# Patient Record
Sex: Female | Born: 1940
Health system: Southern US, Community
[De-identification: ages and names within clinical notes are randomized; demographics above are authoritative.]

## PROBLEM LIST (undated history)

## (undated) DIAGNOSIS — F419 Anxiety disorder, unspecified: Secondary | ICD-10-CM

## (undated) DIAGNOSIS — I10 Essential (primary) hypertension: Secondary | ICD-10-CM

## (undated) DIAGNOSIS — R519 Headache, unspecified: Secondary | ICD-10-CM

## (undated) DIAGNOSIS — R112 Nausea with vomiting, unspecified: Secondary | ICD-10-CM

## (undated) DIAGNOSIS — Z5189 Encounter for other specified aftercare: Secondary | ICD-10-CM

## (undated) DIAGNOSIS — H269 Unspecified cataract: Secondary | ICD-10-CM

## (undated) DIAGNOSIS — R51 Headache: Secondary | ICD-10-CM

## (undated) DIAGNOSIS — Z9889 Other specified postprocedural states: Secondary | ICD-10-CM

## (undated) DIAGNOSIS — M1990 Unspecified osteoarthritis, unspecified site: Secondary | ICD-10-CM

## (undated) DIAGNOSIS — K219 Gastro-esophageal reflux disease without esophagitis: Secondary | ICD-10-CM

## (undated) DIAGNOSIS — E039 Hypothyroidism, unspecified: Secondary | ICD-10-CM

## (undated) DIAGNOSIS — H3530 Unspecified macular degeneration: Secondary | ICD-10-CM

## (undated) DIAGNOSIS — D649 Anemia, unspecified: Secondary | ICD-10-CM

## (undated) HISTORY — PX: TUBAL LIGATION: SHX77

## (undated) HISTORY — DX: Encounter for other specified aftercare: Z51.89

## (undated) HISTORY — DX: Unspecified cataract: H26.9

## (undated) HISTORY — PX: EYE SURGERY: SHX253

## (undated) HISTORY — DX: Unspecified macular degeneration: H35.30

## (undated) HISTORY — PX: CHOLECYSTECTOMY: SHX55

## (undated) HISTORY — PX: APPENDECTOMY: SHX54

---

## 1978-02-23 HISTORY — PX: ABDOMINAL HYSTERECTOMY: SHX81

## 2004-05-16 ENCOUNTER — Ambulatory Visit: Payer: Self-pay | Admitting: Unknown Physician Specialty

## 2005-02-20 ENCOUNTER — Ambulatory Visit: Payer: Self-pay | Admitting: Podiatry

## 2005-06-23 ENCOUNTER — Ambulatory Visit: Payer: Self-pay | Admitting: Internal Medicine

## 2005-06-26 ENCOUNTER — Ambulatory Visit: Payer: Self-pay | Admitting: Internal Medicine

## 2006-01-22 ENCOUNTER — Ambulatory Visit: Payer: Self-pay | Admitting: Internal Medicine

## 2006-02-06 ENCOUNTER — Emergency Department: Payer: Self-pay

## 2006-02-19 ENCOUNTER — Emergency Department: Payer: Self-pay | Admitting: Internal Medicine

## 2006-08-03 ENCOUNTER — Ambulatory Visit: Payer: Self-pay | Admitting: Family Medicine

## 2007-04-30 ENCOUNTER — Ambulatory Visit: Payer: Self-pay | Admitting: Unknown Physician Specialty

## 2008-10-26 DIAGNOSIS — K648 Other hemorrhoids: Secondary | ICD-10-CM | POA: Insufficient documentation

## 2008-10-26 DIAGNOSIS — K219 Gastro-esophageal reflux disease without esophagitis: Secondary | ICD-10-CM | POA: Insufficient documentation

## 2008-10-26 DIAGNOSIS — F411 Generalized anxiety disorder: Secondary | ICD-10-CM | POA: Insufficient documentation

## 2009-06-09 ENCOUNTER — Emergency Department: Payer: Self-pay | Admitting: Emergency Medicine

## 2009-06-28 DIAGNOSIS — M545 Low back pain, unspecified: Secondary | ICD-10-CM | POA: Insufficient documentation

## 2010-01-03 ENCOUNTER — Ambulatory Visit: Payer: Self-pay | Admitting: Unknown Physician Specialty

## 2010-01-07 LAB — PATHOLOGY REPORT

## 2010-06-13 ENCOUNTER — Ambulatory Visit: Payer: Self-pay | Admitting: Family Medicine

## 2012-02-24 HISTORY — PX: CATARACT EXTRACTION: SUR2

## 2012-09-01 ENCOUNTER — Ambulatory Visit: Payer: Self-pay | Admitting: Family Medicine

## 2012-12-14 ENCOUNTER — Ambulatory Visit: Payer: Self-pay | Admitting: Family Medicine

## 2013-05-15 LAB — LIPID PANEL
Cholesterol: 209 mg/dL — AB (ref 0–200)
HDL: 85 mg/dL — AB (ref 35–70)
LDL CALC: 98 mg/dL
Triglycerides: 129 mg/dL (ref 40–160)

## 2013-06-01 ENCOUNTER — Ambulatory Visit: Payer: Self-pay | Admitting: Family Medicine

## 2014-07-31 ENCOUNTER — Other Ambulatory Visit: Payer: Self-pay | Admitting: Family Medicine

## 2014-07-31 DIAGNOSIS — G43909 Migraine, unspecified, not intractable, without status migrainosus: Secondary | ICD-10-CM | POA: Insufficient documentation

## 2014-07-31 DIAGNOSIS — E039 Hypothyroidism, unspecified: Secondary | ICD-10-CM

## 2014-07-31 DIAGNOSIS — G43009 Migraine without aura, not intractable, without status migrainosus: Secondary | ICD-10-CM

## 2014-09-27 DIAGNOSIS — M5417 Radiculopathy, lumbosacral region: Secondary | ICD-10-CM | POA: Insufficient documentation

## 2014-10-01 ENCOUNTER — Other Ambulatory Visit: Payer: Self-pay | Admitting: Unknown Physician Specialty

## 2014-10-01 DIAGNOSIS — M5417 Radiculopathy, lumbosacral region: Secondary | ICD-10-CM

## 2014-10-05 ENCOUNTER — Ambulatory Visit
Admission: RE | Admit: 2014-10-05 | Discharge: 2014-10-05 | Disposition: A | Discharge status: Home / Self Care | Payer: Medicare PPO | Source: Ambulatory Visit | Attending: Unknown Physician Specialty | Admitting: Unknown Physician Specialty

## 2014-10-05 DIAGNOSIS — M5417 Radiculopathy, lumbosacral region: Secondary | ICD-10-CM | POA: Diagnosis present

## 2014-10-05 DIAGNOSIS — M5136 Other intervertebral disc degeneration, lumbar region: Secondary | ICD-10-CM | POA: Diagnosis not present

## 2014-10-05 DIAGNOSIS — M4807 Spinal stenosis, lumbosacral region: Secondary | ICD-10-CM | POA: Diagnosis not present

## 2014-10-24 ENCOUNTER — Other Ambulatory Visit: Payer: Self-pay | Admitting: Unknown Physician Specialty

## 2014-10-24 DIAGNOSIS — R935 Abnormal findings on diagnostic imaging of other abdominal regions, including retroperitoneum: Secondary | ICD-10-CM

## 2014-10-31 ENCOUNTER — Ambulatory Visit
Admission: RE | Admit: 2014-10-31 | Discharge: 2014-10-31 | Disposition: A | Discharge status: Home / Self Care | Payer: Medicare PPO | Source: Ambulatory Visit | Attending: Unknown Physician Specialty | Admitting: Unknown Physician Specialty

## 2014-10-31 DIAGNOSIS — R1900 Intra-abdominal and pelvic swelling, mass and lump, unspecified site: Secondary | ICD-10-CM | POA: Diagnosis not present

## 2014-10-31 DIAGNOSIS — R935 Abnormal findings on diagnostic imaging of other abdominal regions, including retroperitoneum: Secondary | ICD-10-CM | POA: Insufficient documentation

## 2014-11-19 ENCOUNTER — Telehealth: Payer: Self-pay | Admitting: Family Medicine

## 2014-11-19 NOTE — Telephone Encounter (Signed)
Ok to order. Thanks.   

## 2014-11-19 NOTE — Telephone Encounter (Signed)
pt called needing appt for her feet swelling.  Wants to know if she can come in tomorrow.  All appts available are marked same day.    Please advise a time.  Her call back is [phone].  Thanks Barth Kirks

## 2014-11-19 NOTE — Telephone Encounter (Signed)
Coming in tomorrow.  At 10:45.  Thanks [employer]

## 2014-11-20 ENCOUNTER — Encounter: Payer: Self-pay | Admitting: Family Medicine

## 2014-11-20 ENCOUNTER — Ambulatory Visit (INDEPENDENT_AMBULATORY_CARE_PROVIDER_SITE_OTHER): Payer: Medicare PPO | Admitting: Family Medicine

## 2014-11-20 ENCOUNTER — Ambulatory Visit
Admission: RE | Admit: 2014-11-20 | Discharge: 2014-11-20 | Disposition: A | Discharge status: Home / Self Care | Payer: Medicare PPO | Source: Ambulatory Visit | Attending: Family Medicine | Admitting: Family Medicine

## 2014-11-20 VITALS — BP 116/64 | HR 80 | Temp 98.4°F | Resp 16 | Ht 65.0 in | Wt 136.0 lb

## 2014-11-20 DIAGNOSIS — M10072 Idiopathic gout, left ankle and foot: Secondary | ICD-10-CM | POA: Diagnosis not present

## 2014-11-20 DIAGNOSIS — I10 Essential (primary) hypertension: Secondary | ICD-10-CM | POA: Insufficient documentation

## 2014-11-20 DIAGNOSIS — R011 Cardiac murmur, unspecified: Secondary | ICD-10-CM | POA: Insufficient documentation

## 2014-11-20 DIAGNOSIS — R609 Edema, unspecified: Secondary | ICD-10-CM

## 2014-11-20 DIAGNOSIS — M7989 Other specified soft tissue disorders: Secondary | ICD-10-CM | POA: Insufficient documentation

## 2014-11-20 DIAGNOSIS — R0602 Shortness of breath: Secondary | ICD-10-CM | POA: Insufficient documentation

## 2014-11-20 DIAGNOSIS — E039 Hypothyroidism, unspecified: Secondary | ICD-10-CM

## 2014-11-20 DIAGNOSIS — G939 Disorder of brain, unspecified: Secondary | ICD-10-CM | POA: Insufficient documentation

## 2014-11-20 DIAGNOSIS — M109 Gout, unspecified: Secondary | ICD-10-CM | POA: Insufficient documentation

## 2014-11-20 DIAGNOSIS — H6980 Other specified disorders of Eustachian tube, unspecified ear: Secondary | ICD-10-CM | POA: Insufficient documentation

## 2014-11-20 DIAGNOSIS — I6782 Cerebral ischemia: Secondary | ICD-10-CM | POA: Insufficient documentation

## 2014-11-20 DIAGNOSIS — H6990 Unspecified Eustachian tube disorder, unspecified ear: Secondary | ICD-10-CM | POA: Insufficient documentation

## 2014-11-20 DIAGNOSIS — E7800 Pure hypercholesterolemia, unspecified: Secondary | ICD-10-CM | POA: Insufficient documentation

## 2014-11-20 DIAGNOSIS — R195 Other fecal abnormalities: Secondary | ICD-10-CM | POA: Insufficient documentation

## 2014-11-20 MED ORDER — PREDNISONE 10 MG PO TABS: ORAL_TABLET | ORAL | Status: DC

## 2014-11-20 NOTE — Progress Notes (Signed)
Subjective:    Patient ID: Jamie [name], female    DOB: [birth year], 74 y.o.   MRN: [id number]  HPI  Edema: Patient complains of edema. The location of the edema is ankle(s) bilateral (left worse than right).  The edema has been moderate.  Onset of symptoms was 5 weeks ago, gradually worsening since that time. The edema is present all day. The patient states the problem is long-standing.  The swelling has been aggravated by increased salt intake, relieved by nothing, and been associated with nothing. Cardiac risk factors include advanced age (older than 75 for men, 59 for women) and hypertension.  Has been on HCTZ for this in the past. Went back to whole pill. Also, having foot pain in her left foot. Is severe at time. Hurts to walk or move a certain.     Review of Systems  Constitutional: Positive for fatigue and unexpected weight change (gain due to swelling). Negative for fever, chills, diaphoresis, activity change and appetite change.  Respiratory: Negative for cough, chest tightness, shortness of breath and wheezing.   Cardiovascular: Positive for leg swelling. Negative for chest pain and palpitations.   BP 116/64 mmHg  Pulse 80  Temp(Src) 98.4 F (36.9 C) (Oral)  Resp 16  Ht  (1.651 m)  Wt 136 lb (61.689 kg)  BMI 22.63 kg/m2  SpO2 100%   Patient Active Problem List   Diagnosis Date Noted  . Dysfunction of eustachian tube 11/20/2014  . Fecal occult blood test positive 11/20/2014  . Hypercholesteremia 11/20/2014  . BP (high blood pressure) 11/20/2014  . Cardiac murmur 11/20/2014  . Temporary cerebral vascular dysfunction 11/20/2014  . L-S radiculopathy 09/27/2014  . Migraine 07/31/2014  . Hypothyroidism 07/31/2014  . Colon polyp 12/24/2009  . LBP (low back pain) 06/28/2009  . Anxiety state 10/26/2008  . Accumulation of fluid in tissues 10/26/2008  . Acid reflux 10/26/2008  . Esophageal stenosis 10/26/2008  . Hemorrhoids, internal 10/26/2008  . Menopausal and  perimenopausal disorder 10/26/2008  . Allergic rhinitis 07/13/2006   No past medical history on file. Current Outpatient Prescriptions on File Prior to Visit  Medication Sig  . nortriptyline (PAMELOR) 50 MG capsule TAKE 1 CAPSULE DAILY  . SYNTHROID 125 MCG tablet TAKE 1 TABLET BY MOUTH EVERY DAY   No current facility-administered medications on file prior to visit.   Allergies  Allergen Reactions  . Codeine   . Penicillins Rash   Past Surgical History  Procedure Laterality Date  . Cataract extraction Bilateral 2014  . Cholecystectomy    . Abdominal hysterectomy  1980  . Tubal ligation     Social History   Social History  . Marital Status: Married    Spouse Name: N/A  . Number of Children: N/A  . Years of Education: N/A   Occupational History  . Not on file.   Social History Main Topics  . Smoking status: Never Smoker   . Smokeless tobacco: Never Used  . Alcohol Use: No  . Drug Use: No  . Sexual Activity: Not on file   Other Topics Concern  . Not on file   Social History Narrative   Family History  Problem Relation Age of Onset  . Hyperlipidemia Mother   . Heart attack Father   . Emphysema Father   . Healthy Sister   . Diabetes Brother   . Lymphoma Brother   . Stroke Maternal Grandmother        Objective:   Physical Exam  Constitutional:  She is oriented to person, place, and time. She appears well-developed and well-nourished.  Cardiovascular: Normal rate and regular rhythm.   Pulmonary/Chest: Effort normal and breath sounds normal.  Musculoskeletal: She exhibits edema and tenderness.  In left foot.   Neurological: She is alert and oriented to person, place, and time.  Psychiatric: She has a normal mood and affect. Her behavior is normal. Judgment and thought content normal.   BP 116/64 mmHg  Pulse 80  Temp(Src) 98.4 F (36.9 C) (Oral)  Resp 16  Ht  (1.651 m)  Wt 136 lb (61.689 kg)  BMI 22.63 kg/m2  SpO2 100%      Assessment & Plan:   1. Accumulation of fluid in tissues Will check labs and study as below.  Further treatment pending these results.  - CBC with Differential/Platelet - D-Dimer, Quantitative - Comprehensive metabolic panel - US Venous Img Lower Unilateral Left; Future  2. Hypothyroidism, unspecified hypothyroidism type Stable.  Check labs.  - CBC with Differential/Platelet - TSH  3. Acute idiopathic gout of left foot May be etiology of pain. Will check labs, treat and further plan pending these results.  - Uric acid - predniSONE (DELTASONE) 10 MG tablet; 6 po for 1 day and then 5 po for 1 day and then 4 po for 1 day and 3 po for 1 day and then 2 po for 1 day and then 1 po for 1 day.  Dispense: 21 tablet; Refill: 0  4. Swelling of left lower extremity Will check for DVT.  - D-Dimer, Quantitative - US Venous Img Lower Unilateral Left; Future - US Venous Img Lower Unilateral Left - US Venous Img Lower Unilateral Left; Future  5. Shortness of breath Will check for DVT and CHF.   - B Nat Peptide - US Venous Img Lower Unilateral Left; Future  Lorie Phenix, MD

## 2014-11-21 ENCOUNTER — Telehealth: Payer: Self-pay

## 2014-11-21 LAB — CBC WITH DIFFERENTIAL/PLATELET
BASOS ABS: 0 10*3/uL (ref 0.0–0.2)
Basos: 1 %
EOS (ABSOLUTE): 0.4 10*3/uL (ref 0.0–0.4)
Eos: 6 %
Hematocrit: 38 % (ref 34.0–46.6)
Hemoglobin: 11.9 g/dL (ref 11.1–15.9)
Immature Grans (Abs): 0 10*3/uL (ref 0.0–0.1)
Immature Granulocytes: 0 %
LYMPHS ABS: 1.9 10*3/uL (ref 0.7–3.1)
Lymphs: 32 %
MCH: 24.6 pg — AB (ref 26.6–33.0)
MCHC: 31.3 g/dL — AB (ref 31.5–35.7)
MCV: 79 fL (ref 79–97)
Monocytes Absolute: 0.6 10*3/uL (ref 0.1–0.9)
Monocytes: 10 %
NEUTROS ABS: 3.1 10*3/uL (ref 1.4–7.0)
Neutrophils: 51 %
PLATELETS: 369 10*3/uL (ref 150–379)
RBC: 4.84 x10E6/uL (ref 3.77–5.28)
RDW: 15.2 % (ref 12.3–15.4)
WBC: 6.1 10*3/uL (ref 3.4–10.8)

## 2014-11-21 LAB — COMPREHENSIVE METABOLIC PANEL
A/G RATIO: 1.8 (ref 1.1–2.5)
ALT: 21 IU/L (ref 0–32)
AST: 28 IU/L (ref 0–40)
Albumin: 4.5 g/dL (ref 3.5–4.8)
Alkaline Phosphatase: 68 IU/L (ref 39–117)
BILIRUBIN TOTAL: 0.2 mg/dL (ref 0.0–1.2)
BUN/Creatinine Ratio: 18 (ref 11–26)
BUN: 14 mg/dL (ref 8–27)
CHLORIDE: 98 mmol/L (ref 97–108)
CO2: 28 mmol/L (ref 18–29)
Calcium: 10.1 mg/dL (ref 8.7–10.3)
Creatinine, Ser: 0.76 mg/dL (ref 0.57–1.00)
GFR calc non Af Amer: 79 mL/min/{1.73_m2} (ref >59)
GFR, EST AFRICAN AMERICAN: 91 mL/min/{1.73_m2} (ref >59)
GLOBULIN, TOTAL: 2.5 g/dL (ref 1.5–4.5)
Glucose: 95 mg/dL (ref 65–99)
POTASSIUM: 4.1 mmol/L (ref 3.5–5.2)
SODIUM: 143 mmol/L (ref 134–144)
TOTAL PROTEIN: 7 g/dL (ref 6.0–8.5)

## 2014-11-21 LAB — TSH: TSH: 0.227 u[IU]/mL — ABNORMAL LOW (ref 0.450–4.500)

## 2014-11-21 LAB — URIC ACID: Uric Acid: 5.7 mg/dL (ref 2.5–7.1)

## 2014-11-21 LAB — D-DIMER, QUANTITATIVE (NOT AT ARMC): D-DIMER: 0.46 mg{FEU}/L (ref 0.00–0.49)

## 2014-11-21 LAB — BRAIN NATRIURETIC PEPTIDE: BNP: 21 pg/mL (ref 0.0–100.0)

## 2014-11-21 NOTE — Telephone Encounter (Signed)
-----   Message from Lorie Phenix, MD sent at 11/20/2014  3:43 PM EDT ----- No DVT.  Please notify patient. Thanks.

## 2014-11-21 NOTE — Telephone Encounter (Signed)
Informed pt as below. Emily Drozdowski, CMA  

## 2014-11-22 ENCOUNTER — Telehealth: Payer: Self-pay | Admitting: Family Medicine

## 2014-11-22 DIAGNOSIS — M79673 Pain in unspecified foot: Secondary | ICD-10-CM | POA: Insufficient documentation

## 2014-11-22 DIAGNOSIS — M79672 Pain in left foot: Secondary | ICD-10-CM

## 2014-11-22 NOTE — Telephone Encounter (Signed)
Pt called to say she has taken 8 prednisone and her feet are still swollen and painful.  She said she can not tell any difference since she has been on the prednisone.    Her call back [phone]  Thanks Barth Kirks

## 2014-11-22 NOTE — Telephone Encounter (Signed)
Pt wants to know if she needs to continue taking her prednisone, since she was told on her last ov (11/20/2014), to call if any improvement on her leg swelling or pain; pt stated that she continues having the same pain and that her swelling has not improved. Pt also stated that it was mentioned that the next thing was going to be an x-ray.   Please advise.   Thanks,

## 2014-11-22 NOTE — Telephone Encounter (Signed)
Tender to touch, 2 toes are painful with bending up.  Swelling not back.   Ok to go back on the fluid pill.   Will schedule Xray. Thanks.

## 2014-11-23 ENCOUNTER — Ambulatory Visit
Admission: RE | Admit: 2014-11-23 | Discharge: 2014-11-23 | Disposition: A | Discharge status: Home / Self Care | Payer: Medicare PPO | Source: Ambulatory Visit | Attending: Family Medicine | Admitting: Family Medicine

## 2014-11-23 ENCOUNTER — Telehealth: Payer: Self-pay

## 2014-11-23 DIAGNOSIS — M79672 Pain in left foot: Secondary | ICD-10-CM

## 2014-11-23 NOTE — Telephone Encounter (Signed)
Informed pt of results. Pt reports she has a prescription for Mobic 7.5 mg; advised pt to take this med BID for foot pain. Pt is requesting referral to Dr. Orland Jarred. Order has been placed. Allene Dillon, CMA

## 2014-11-23 NOTE — Telephone Encounter (Signed)
-----   Message from Lorie Phenix, MD sent at 11/22/2014 10:27 AM EDT ----- Called patient about labs yesterday. Already let her know no sign of clot or heart failure. Also unclear if was gout as uric acid was not elevated. Has just started Prednisone and foot pain was not improved yet. Please call and check and see how swelling and foot pain is today. Thanks.

## 2014-11-23 NOTE — Telephone Encounter (Signed)
No improvement. See plan in other pt message. Allene Dillon, CMA

## 2014-11-23 NOTE — Telephone Encounter (Signed)
-----   Message from Lorie Phenix, MD sent at 11/23/2014 11:52 AM EDT ----- Xray normal. Think just soft tissue injury, can send in rx for anti-inflammatory like Mobic or podiatry referral if not improving.  Thanks.

## 2014-12-25 ENCOUNTER — Ambulatory Visit (INDEPENDENT_AMBULATORY_CARE_PROVIDER_SITE_OTHER): Payer: Medicare PPO | Admitting: Family Medicine

## 2014-12-25 ENCOUNTER — Encounter: Payer: Self-pay | Admitting: Family Medicine

## 2014-12-25 VITALS — BP 122/66 | HR 84 | Temp 98.1°F | Resp 16 | Ht 65.0 in | Wt 137.0 lb

## 2014-12-25 DIAGNOSIS — Z23 Encounter for immunization: Secondary | ICD-10-CM | POA: Diagnosis not present

## 2014-12-25 DIAGNOSIS — Z1211 Encounter for screening for malignant neoplasm of colon: Secondary | ICD-10-CM

## 2014-12-25 DIAGNOSIS — Z1231 Encounter for screening mammogram for malignant neoplasm of breast: Secondary | ICD-10-CM

## 2014-12-25 DIAGNOSIS — Z780 Asymptomatic menopausal state: Secondary | ICD-10-CM | POA: Diagnosis not present

## 2014-12-25 DIAGNOSIS — Z0000 Encounter for general adult medical examination without abnormal findings: Secondary | ICD-10-CM

## 2014-12-25 NOTE — Progress Notes (Signed)
Patient: Jamie [name] Heesch, Female    DOB: 1940/11/07, 74 y.o.   MRN: [id number] Visit Date: 12/25/2014  Today's Provider: Lorie PhenixNancy Dempsey Ahonen, MD   Chief Complaint  Patient presents with  . Medicare Wellness   Subjective:    Annual wellness visit Jamie [name] Burch is a 74 y.o. female. She feels well. She reports exercising daily; walks 2 miles. She reports she is sleeping well.  Last AWV- 12/01/2013 Last Mam- 12/14/2012- BI-RADS 1 Last colon- 01/03/2010 Polyp, diverticulosis, internal hemorrhoids- Repeat 5 years Last EKG- 05/15/2010 Prevnar- 01/10/2014 Pneumovax 23- 05/15/2010 -----------------------------------------------------------   Review of Systems  Constitutional: Negative.   HENT: Negative.   Eyes: Positive for pain and itching. Negative for photophobia, discharge, redness and visual disturbance.  Respiratory: Negative.   Cardiovascular: Negative.   Gastrointestinal: Negative.   Endocrine: Negative.   Genitourinary: Negative.   Musculoskeletal: Negative.   Skin: Negative.   Allergic/Immunologic: Negative.   Neurological: Negative.   Hematological: Negative.   Psychiatric/Behavioral: Negative.     Social History   Social History  . Marital Status: Married    Spouse Name: N/A  . Number of Children: N/A  . Years of Education: N/A   Occupational History  . Not on file.   Social History Main Topics  . Smoking status: Never Smoker   . Smokeless tobacco: Never Used  . Alcohol Use: No  . Drug Use: No  . Sexual Activity: Not on file   Other Topics Concern  . Not on file   Social History Narrative    Patient Active Problem List   Diagnosis Date Noted  . Foot pain 11/22/2014  . Dysfunction of eustachian tube 11/20/2014  . Fecal occult blood test positive 11/20/2014  . Hypercholesteremia 11/20/2014  . BP (high blood pressure) 11/20/2014  . Cardiac murmur 11/20/2014  . Temporary cerebral vascular dysfunction 11/20/2014  . Gout attack 11/20/2014    . Swelling of left lower extremity 11/20/2014  . Shortness of breath 11/20/2014  . Brain disorder 11/20/2014  . Essential (primary) hypertension 11/20/2014  . L-S radiculopathy 09/27/2014  . Migraine 07/31/2014  . Hypothyroidism 07/31/2014  . Headache, migraine 07/31/2014  . Colon polyp 12/24/2009  . LBP (low back pain) 06/28/2009  . Anxiety state 10/26/2008  . Accumulation of fluid in tissues 10/26/2008  . Acid reflux 10/26/2008  . Esophageal stenosis 10/26/2008  . Hemorrhoids, internal 10/26/2008  . Menopausal and perimenopausal disorder 10/26/2008  . Gastro-esophageal reflux disease without esophagitis 10/26/2008  . Allergic rhinitis 07/13/2006    Past Surgical History  Procedure Laterality Date  . Cataract extraction Bilateral 2014  . Cholecystectomy    . Abdominal hysterectomy  1980  . Tubal ligation      Her family history includes Diabetes in her brother; Emphysema in her father; Healthy in her sister; Heart attack in her father; Hyperlipidemia in her mother; Lymphoma in her brother; Stroke in her maternal grandmother.    Previous Medications   ALPRAZOLAM (XANAX) 0.25 MG TABLET    Take by mouth.   CALCIUM CARBONATE (CALCIUM 600 PO)       ESOMEPRAZOLE (NEXIUM) 40 MG CAPSULE    Take by mouth.   GABAPENTIN (NEURONTIN) 100 MG CAPSULE    Take by mouth.   MELOXICAM (MOBIC) 7.5 MG TABLET    Take by mouth.   MULTIPLE VITAMIN PO       NORTRIPTYLINE (PAMELOR) 50 MG CAPSULE    TAKE 1 CAPSULE DAILY   SYNTHROID 125 MCG TABLET  TAKE 1 TABLET BY MOUTH EVERY DAY   TRAMADOL (ULTRAM) 50 MG TABLET    Take by mouth.   TRIAMTERENE-HYDROCHLOROTHIAZIDE (MAXZIDE-25) 37.5-25 MG PER TABLET    Take by mouth.   VITAMIN D, CHOLECALCIFEROL, 1000 UNITS CAPS        Patient Care Team: Lorie Phenix, MD as PCP - General (Family Medicine)     Objective:   Vitals: BP 122/66 mmHg  Pulse 84  Temp(Src) 98.1 F (36.7 C) (Oral)  Resp 16  Ht  (1.651 m)  Wt 137 lb (62.143 kg)  BMI 22.80  kg/m2  Physical Exam  Constitutional: She is oriented to person, place, and time. She appears well-developed and well-nourished.  HENT:  Head: Normocephalic and atraumatic.  Right Ear: Tympanic membrane, external ear and ear canal normal.  Left Ear: Tympanic membrane, external ear and ear canal normal.  Nose: Nose normal.  Mouth/Throat: Uvula is midline, oropharynx is clear and moist and mucous membranes are normal.  Eyes: Conjunctivae, EOM and lids are normal. Pupils are equal, round, and reactive to light.  Neck: Trachea normal and normal range of motion. Neck supple. Carotid bruit is not present. No thyroid mass and no thyromegaly present.  Cardiovascular: Normal rate, regular rhythm and normal heart sounds.   Pulmonary/Chest: Effort normal and breath sounds normal.  Abdominal: Soft. Normal appearance and bowel sounds are normal. There is no hepatosplenomegaly. There is no tenderness.  Genitourinary: No breast swelling, tenderness or discharge.  Musculoskeletal: Normal range of motion.  Lymphadenopathy:    She has no cervical adenopathy.    She has no axillary adenopathy.  Neurological: She is alert and oriented to person, place, and time. She has normal strength. No cranial nerve deficit.  Skin: Skin is warm, dry and intact.  Psychiatric: She has a normal mood and affect. Her speech is normal and behavior is normal. Judgment and thought content normal. Cognition and memory are normal.    Activities of Daily Living In your present state of health, do you have any difficulty performing the following activities: 12/25/2014  Hearing? N  Vision? N  Difficulty concentrating or making decisions? N  Walking or climbing stairs? N  Dressing or bathing? N  Doing errands, shopping? N    Fall Risk Assessment Fall Risk  12/25/2014  Falls in the past year? No     Depression Screen PHQ 2/9 Scores 12/25/2014  PHQ - 2 Score 0    Cognitive Testing - 6-CIT  Correct? Score   What year is  it? yes 0 0 or 4  What month is it? yes 0 0 or 3  Memorize:    Floyde Parkins,  [birth year],  [address],  [place],      What time is it? (within 1 hour) yes 0 0 or 3  Count backwards from 20 yes 0 0, 2, or 4  Name the months of the year yes 0 0, 2, or 4  Repeat name & address above yes 0 0, 2, 4, 6, 8, or 10       TOTAL SCORE  0/28   Interpretation:  Normal  Normal (0-7) Abnormal (8-28)       Assessment & Plan:     Annual Wellness Visit  Reviewed patient's Family Medical History Reviewed and updated list of patient's medical providers Assessment of cognitive impairment was done Assessed patient's functional ability Established a written schedule for health screening services Health Risk Assessent Completed and Reviewed  Exercise Activities and Dietary recommendations Goals  None      Immunization History  Administered Date(s) Administered  . Influenza-Unspecified 10/24/2013  . Pneumococcal Conjugate-13 01/10/2014  . Pneumococcal Polysaccharide-23 05/15/2010    Health Maintenance  Topic Date Due  . TETANUS/TDAP  01/17/1961  . MAMMOGRAM  01/18/1992  . COLONOSCOPY  01/18/1992  . ZOSTAVAX  01/17/2002  . DEXA SCAN  01/18/2007  . INFLUENZA VACCINE  09/24/2014  . PNA vac Low Risk Adult  Completed      Discussed health benefits of physical activity, and encouraged her to engage in regular exercise appropriate for her age and condition.    ------------------------------------------------------------------------------------------------------------ 1. Medicare annual wellness visit, subsequent As above.   2. Encounter for screening colonoscopy FU pending results. Will refer back to Dr. Mechele Collin. - Ambulatory referral to Gastroenterology  3. Encounter for screening mammogram for breast cancer FU pending results. - MM DIGITAL SCREENING BILATERAL; Future  4. Post-menopausal FU pending results. - DG Bone Density; Future  5. Flu vaccine need Had cortisone injection 5 days  ago; would prefer to wait until next week for flu immunization. Pt to schedule appointment.  Patient was seen and examined by Leo Grosser, MD, and note scribed by Allene Dillon, CMA.  I have reviewed the document for accuracy and completeness and I agree with above. Leo Grosser, MD   Lorie Phenix, MD

## 2015-01-03 ENCOUNTER — Ambulatory Visit (INDEPENDENT_AMBULATORY_CARE_PROVIDER_SITE_OTHER): Payer: Medicare PPO

## 2015-01-03 DIAGNOSIS — Z23 Encounter for immunization: Secondary | ICD-10-CM | POA: Diagnosis not present

## 2015-01-16 ENCOUNTER — Other Ambulatory Visit: Payer: Self-pay | Admitting: Family Medicine

## 2015-01-16 ENCOUNTER — Ambulatory Visit
Admission: RE | Admit: 2015-01-16 | Discharge: 2015-01-16 | Disposition: A | Discharge status: Home / Self Care | Payer: Medicare PPO | Source: Ambulatory Visit | Attending: Family Medicine | Admitting: Family Medicine

## 2015-01-16 DIAGNOSIS — Z1231 Encounter for screening mammogram for malignant neoplasm of breast: Secondary | ICD-10-CM

## 2015-01-16 DIAGNOSIS — Z780 Asymptomatic menopausal state: Secondary | ICD-10-CM | POA: Insufficient documentation

## 2015-01-21 ENCOUNTER — Telehealth: Payer: Self-pay

## 2015-01-21 NOTE — Telephone Encounter (Signed)
-----   Message from Lorie PhenixNancy Maloney, MD sent at 01/17/2015  6:00 PM EST ----- Borderline for osteoporosis.   Recommend ov to discuss treatment options. Not emergent. Thanks.

## 2015-01-21 NOTE — Telephone Encounter (Signed)
LMTCB 01/21/2015  Thanks,   -Laura  

## 2015-01-25 NOTE — Telephone Encounter (Signed)
Pt advised; apt made for 01/30/2015 at 9:30  Thanks,   -Vernona RiegerLaura

## 2015-01-25 NOTE — Telephone Encounter (Signed)
Patient returned the call. Wanted to speak to speak to Vernona RiegerLaura.  Thanks,  -Joseline

## 2015-01-27 ENCOUNTER — Other Ambulatory Visit: Payer: Self-pay | Admitting: Family Medicine

## 2015-01-27 DIAGNOSIS — F411 Generalized anxiety disorder: Secondary | ICD-10-CM

## 2015-01-30 ENCOUNTER — Ambulatory Visit: Payer: Self-pay | Admitting: Family Medicine

## 2015-02-04 ENCOUNTER — Other Ambulatory Visit: Payer: Self-pay | Admitting: Family Medicine

## 2015-02-04 DIAGNOSIS — M545 Low back pain: Secondary | ICD-10-CM

## 2015-02-06 ENCOUNTER — Other Ambulatory Visit: Payer: Self-pay | Admitting: Family Medicine

## 2015-02-06 DIAGNOSIS — M5417 Radiculopathy, lumbosacral region: Secondary | ICD-10-CM

## 2015-02-10 ENCOUNTER — Other Ambulatory Visit: Payer: Self-pay | Admitting: Family Medicine

## 2015-02-10 DIAGNOSIS — M545 Low back pain: Secondary | ICD-10-CM

## 2015-02-11 NOTE — Telephone Encounter (Signed)
Printed, please fax or call in to pharmacy. Thank you.   

## 2015-02-13 ENCOUNTER — Telehealth: Payer: Self-pay

## 2015-02-13 MED ORDER — AZITHROMYCIN 250 MG PO TABS: ORAL_TABLET | ORAL | Status: DC

## 2015-02-13 NOTE — Telephone Encounter (Signed)
Patient is requesting a Z-pak be sent to [facility] [place]. Patient states she has sinus pressure, headache, drainage, and congestion for almost 2 weeks.

## 2015-03-06 ENCOUNTER — Ambulatory Visit: Payer: Self-pay | Admitting: Family Medicine

## 2015-03-21 ENCOUNTER — Encounter: Payer: Self-pay | Admitting: *Deleted

## 2015-03-22 ENCOUNTER — Ambulatory Visit: Payer: PPO | Admitting: Anesthesiology

## 2015-03-22 ENCOUNTER — Encounter
Admission: RE | Disposition: A | Discharge status: Home / Self Care | Payer: Self-pay | Source: Ambulatory Visit | Attending: Unknown Physician Specialty

## 2015-03-22 ENCOUNTER — Encounter: Payer: Self-pay | Admitting: *Deleted

## 2015-03-22 ENCOUNTER — Ambulatory Visit
Admission: RE | Admit: 2015-03-22 | Discharge: 2015-03-22 | Disposition: A | Discharge status: Home / Self Care | Payer: PPO | Source: Ambulatory Visit | Attending: Unknown Physician Specialty | Admitting: Unknown Physician Specialty

## 2015-03-22 DIAGNOSIS — K640 First degree hemorrhoids: Secondary | ICD-10-CM | POA: Insufficient documentation

## 2015-03-22 DIAGNOSIS — Z86010 Personal history of colonic polyps: Secondary | ICD-10-CM | POA: Insufficient documentation

## 2015-03-22 DIAGNOSIS — E039 Hypothyroidism, unspecified: Secondary | ICD-10-CM | POA: Diagnosis not present

## 2015-03-22 DIAGNOSIS — Z1211 Encounter for screening for malignant neoplasm of colon: Secondary | ICD-10-CM | POA: Insufficient documentation

## 2015-03-22 DIAGNOSIS — I10 Essential (primary) hypertension: Secondary | ICD-10-CM | POA: Insufficient documentation

## 2015-03-22 DIAGNOSIS — G709 Myoneural disorder, unspecified: Secondary | ICD-10-CM | POA: Insufficient documentation

## 2015-03-22 DIAGNOSIS — K5730 Diverticulosis of large intestine without perforation or abscess without bleeding: Secondary | ICD-10-CM | POA: Insufficient documentation

## 2015-03-22 DIAGNOSIS — K219 Gastro-esophageal reflux disease without esophagitis: Secondary | ICD-10-CM | POA: Diagnosis not present

## 2015-03-22 DIAGNOSIS — K648 Other hemorrhoids: Secondary | ICD-10-CM | POA: Diagnosis not present

## 2015-03-22 DIAGNOSIS — F419 Anxiety disorder, unspecified: Secondary | ICD-10-CM | POA: Diagnosis not present

## 2015-03-22 DIAGNOSIS — K5790 Diverticulosis of intestine, part unspecified, without perforation or abscess without bleeding: Secondary | ICD-10-CM | POA: Diagnosis not present

## 2015-03-22 DIAGNOSIS — M1990 Unspecified osteoarthritis, unspecified site: Secondary | ICD-10-CM | POA: Diagnosis not present

## 2015-03-22 HISTORY — DX: Hypothyroidism, unspecified: E03.9

## 2015-03-22 HISTORY — PX: COLONOSCOPY WITH PROPOFOL: SHX5780

## 2015-03-22 HISTORY — DX: Headache, unspecified: R51.9

## 2015-03-22 HISTORY — DX: Anxiety disorder, unspecified: F41.9

## 2015-03-22 HISTORY — DX: Unspecified osteoarthritis, unspecified site: M19.90

## 2015-03-22 HISTORY — DX: Gastro-esophageal reflux disease without esophagitis: K21.9

## 2015-03-22 HISTORY — DX: Headache: R51

## 2015-03-22 HISTORY — DX: Essential (primary) hypertension: I10

## 2015-03-22 HISTORY — DX: Anemia, unspecified: D64.9

## 2015-03-22 SURGERY — COLONOSCOPY WITH PROPOFOL
Anesthesia: General

## 2015-03-22 MED ORDER — SODIUM CHLORIDE 0.9 % IV SOLN: INTRAVENOUS | Status: DC

## 2015-03-22 MED ORDER — PROPOFOL 500 MG/50ML IV EMUL
INTRAVENOUS | Status: DC | PRN
  Administered 2015-03-22: 120 ug/kg/min via INTRAVENOUS

## 2015-03-22 MED ORDER — SODIUM CHLORIDE 0.9 % IV SOLN
INTRAVENOUS | Status: DC
  Administered 2015-03-22 (×2): via INTRAVENOUS

## 2015-03-22 MED ORDER — PROPOFOL 10 MG/ML IV BOLUS
INTRAVENOUS | Status: DC | PRN
  Administered 2015-03-22: 30 mg via INTRAVENOUS

## 2015-03-22 MED ORDER — LIDOCAINE HCL (CARDIAC) 20 MG/ML IV SOLN
INTRAVENOUS | Status: DC | PRN
  Administered 2015-03-22: 60 mg via INTRAVENOUS

## 2015-03-22 MED ORDER — PHENYLEPHRINE HCL 10 MG/ML IJ SOLN
INTRAMUSCULAR | Status: DC | PRN
  Administered 2015-03-22: 100 ug via INTRAVENOUS

## 2015-03-22 NOTE — Transfer of Care (Signed)
Immediate Anesthesia Transfer of Care Note  Patient: Jamie [name]  Procedure(s) Performed: Procedure(s): COLONOSCOPY WITH PROPOFOL (N/A)  Patient Location: Endoscopy Unit  Anesthesia Type:General  Level of Consciousness: awake, alert , oriented and patient cooperative  Airway & Oxygen Therapy: Patient Spontanous Breathing and Patient connected to nasal cannula oxygen  Post-op Assessment: Report given to RN, Post -op Vital signs reviewed and stable and Patient moving all extremities X 4  Post vital signs: Reviewed and stable  Last Vitals:  Filed Vitals:   03/22/15 0729  BP: 128/94  Pulse: 90  Temp: 36.2 C  Resp: 17    Complications: No apparent anesthesia complications

## 2015-03-22 NOTE — H&P (Signed)
Primary Care Physician:  Lorie Phenix, MD Primary Gastroenterologist:  Dr. Mechele Collin  Pre-Procedure History & Physical: HPI:  Jamie [name] is a 75 y.o. female is here for an colonoscopy.   Past Medical History  Diagnosis Date  . GERD (gastroesophageal reflux disease)   . Anxiety   . Headache   . Hypothyroidism   . Hypertension   . Arthritis   . Anemia     Past Surgical History  Procedure Laterality Date  . Cataract extraction Bilateral 2014  . Cholecystectomy    . Abdominal hysterectomy  1980  . Tubal ligation    . Appendectomy      Prior to Admission medications   Medication Sig Start Date End Date Taking? Authorizing Provider  triamterene-hydrochlorothiazide (MAXZIDE-25) 37.5-25 MG per tablet Take by mouth. Reported on 03/22/2015 11/13/13  Yes Historical Provider, MD  ALPRAZolam Prudy Feeler) 0.25 MG tablet 1/2 TAB BY MOUTH DAILY 01/28/15   Lorie Phenix, MD  azithromycin (ZITHROMAX) 250 MG tablet 2 today and then one a day for 4 more days. 02/13/15   Lorie Phenix, MD  Calcium Carbonate (CALCIUM 600 PO)  07/13/06   Historical Provider, MD  esomeprazole (NEXIUM) 40 MG capsule Take by mouth. 11/27/13   Historical Provider, MD  gabapentin (NEURONTIN) 100 MG capsule TAKE ONE CAPSULE BY MOUTH TWICE A DAY 02/06/15   Lorie Phenix, MD  meloxicam [facility]) 7.5 MG tablet Take by mouth. 12/07/14   Historical Provider, MD  MULTIPLE VITAMIN PO  07/13/06   Historical Provider, MD  nortriptyline (PAMELOR) 50 MG capsule TAKE 1 CAPSULE DAILY 07/31/14   Lorie Phenix, MD  SYNTHROID 125 MCG tablet TAKE 1 TABLET BY MOUTH EVERY DAY 07/31/14   Lorie Phenix, MD  traMADol Janean Sark) 50 MG tablet TAKE 1 TABLET BY MOUTH EVERY EVENING 02/11/15   Lorie Phenix, MD  Vitamin D, Cholecalciferol, 1000 UNITS CAPS  06/28/09   Historical Provider, MD    Allergies as of 02/06/2015 - Review Complete 12/25/2014  Allergen Reaction Noted  . Codeine  11/20/2014  . Penicillins Rash 11/20/2014    Family History  Problem  Relation Age of Onset  . Hyperlipidemia Mother   . Heart attack Father   . Emphysema Father   . Healthy Sister   . Diabetes Brother   . Lymphoma Brother   . Stroke Maternal Grandmother   . Breast cancer Neg Hx     Social History   Social History  . Marital Status: Married    Spouse Name: N/A  . Number of Children: N/A  . Years of Education: N/A   Occupational History  . Not on file.   Social History Main Topics  . Smoking status: Never Smoker   . Smokeless tobacco: Never Used  . Alcohol Use: No  . Drug Use: No  . Sexual Activity: Not on file   Other Topics Concern  . Not on file   Social History Narrative    Review of Systems: See HPI, otherwise negative ROS  Physical Exam: BP 128/94 mmHg  Pulse 90  Temp(Src) 97.2 F (36.2 C) (Tympanic)  Resp 17  Ht 5' 5.5" (1.664 m)  Wt 58.968 kg (130 lb)  BMI 21.30 kg/m2  SpO2 99% General:   Alert,  pleasant and cooperative in NAD Head:  Normocephalic and atraumatic. Neck:  Supple; no masses or thyromegaly. Lungs:  Clear throughout to auscultation.    Heart:  Regular rate and rhythm. Abdomen:  Soft, nontender and nondistended. Normal bowel sounds, without guarding, and without  rebound.   Neurologic:  Alert and  oriented x4;  grossly normal neurologically.  Impression/Plan: Jamie [name] is here for an colonoscopy to be performed for [facility] colon polyps  Risks, benefits, limitations, and alternatives regarding  colonoscopy have been reviewed with the patient.  Questions have been answered.  All parties agreeable.   Lynnae Prude, MD  03/22/2015, 7:56 AM

## 2015-03-22 NOTE — Op Note (Signed)
[facility] Gastroenterology Patient Name: Jamie [name] Procedure Date: 03/22/2015 7:29 AM MRN: [id number] Account #: [id number] Date of Birth: November 08, 1940 Admit Type: Outpatient Age: 75 Room: [facility] ENDO ROOM 1 Gender: Female Note Status: Finalized Procedure:         Colonoscopy Indications:       High risk colon cancer surveillance: Personal history of                     colonic polyps Providers:         Scot Jun, MD Referring MD:      Leo Grosser, MD (Referring MD) Medicines:         Propofol per Anesthesia Complications:     No immediate complications. Procedure:         Pre-Anesthesia Assessment:                    - After reviewing the risks and benefits, the patient was                     deemed in satisfactory condition to undergo the procedure.                    After obtaining informed consent, the colonoscope was                     passed under direct vision. Throughout the procedure, the                     patient's blood pressure, pulse, and oxygen saturations                     were monitored continuously. The Colonoscope was                     introduced through the anus and advanced to the the cecum,                     identified by appendiceal orifice and ileocecal valve. The                     colonoscopy was performed with difficulty due to                     restricted mobility of the colon, significant looping and                     a tortuous colon. Successful completion of the procedure                     was aided by applying abdominal pressure and performing                     the maneuvers documented (below) in this report. The                     patient tolerated the procedure well. The quality of the                     bowel preparation was good. Findings:      The procedure was difficult due to sharp turns and required a more prone       position to pass the colon with slide by technique to get out of the  sigmoid colon,      A few small-mouthed diverticula were found in the sigmoid colon and in       the descending colon.      Internal hemorrhoids were found during endoscopy. The hemorrhoids were       small and Grade I (internal hemorrhoids that do not prolapse).      The exam was otherwise without abnormality. Impression:        - Diverticulosis in the sigmoid colon and in the                     descending colon.                    - Internal hemorrhoids.                    - The examination was otherwise normal.                    - No specimens collected. Recommendation:    - The findings and recommendations were discussed with the                     patient's family. Likely no repeat needed. Scot Jun, MD 03/22/2015 8:39:37 AM This report has been signed electronically. Number of Addenda: 0 Note Initiated On: 03/22/2015 7:29 AM Scope Withdrawal Time: 0 hours 9 minutes 50 seconds  Total Procedure Duration: 0 hours 32 minutes 18 seconds       [facility]

## 2015-03-22 NOTE — Anesthesia Postprocedure Evaluation (Signed)
Anesthesia Post Note  Patient: Jamie [name]  Procedure(s) Performed: Procedure(s) (LRB): COLONOSCOPY WITH PROPOFOL (N/A)  Patient location during evaluation: Endoscopy Anesthesia Type: General Level of consciousness: awake and alert Pain management: pain level controlled Vital Signs Assessment: post-procedure vital signs reviewed and stable Respiratory status: spontaneous breathing, nonlabored ventilation, respiratory function stable and patient connected to nasal cannula oxygen Cardiovascular status: blood pressure returned to baseline and stable Postop Assessment: no signs of nausea or vomiting Anesthetic complications: no    Last Vitals:  Filed Vitals:   03/22/15 0859 03/22/15 0909  BP: 111/58 113/56  Pulse: 70 70  Temp:    Resp: 14 14    Last Pain:  Filed Vitals:   03/22/15 0909  PainSc: Asleep                 Cleda Mccreedy Piscitello

## 2015-03-22 NOTE — Anesthesia Preprocedure Evaluation (Signed)
Anesthesia Evaluation  Patient identified by MRN, date of birth, ID band Patient awake    Reviewed: Allergy & Precautions, H&P , NPO status , Patient's Chart, lab work & pertinent test results  History of Anesthesia Complications Negative for: history of anesthetic complications  Airway Mallampati: III  TM Distance: <3 FB Neck ROM: limited    Dental  (+) Poor Dentition   Pulmonary neg pulmonary ROS, neg shortness of breath,    Pulmonary exam normal breath sounds clear to auscultation       Cardiovascular Exercise Tolerance: Good hypertension, (-) angina(-) Past MI and (-) DOE Normal cardiovascular exam Rhythm:regular Rate:Normal     Neuro/Psych  Headaches, PSYCHIATRIC DISORDERS Anxiety  Neuromuscular disease    GI/Hepatic Neg liver ROS, GERD  Controlled,  Endo/Other  negative endocrine ROSHypothyroidism   Renal/GU negative Renal ROS  negative genitourinary   Musculoskeletal  (+) Arthritis ,   Abdominal   Peds  Hematology negative hematology ROS (+)   Anesthesia Other Findings Past Medical History:   GERD (gastroesophageal reflux disease)                       Anxiety                                                      Headache                                                     Hypothyroidism                                               Hypertension                                                 Arthritis                                                    Anemia                                                      Past Surgical History:   CATARACT EXTRACTION                             Bilateral 2014         CHOLECYSTECTOMY  ABDOMINAL HYSTERECTOMY                           1980         TUBAL LIGATION                                                APPENDECTOMY                                                 BMI    Body Mass Index   21.29 kg/m 2      Reproductive/Obstetrics negative OB ROS                             Anesthesia Physical Anesthesia Plan  ASA: III  Anesthesia Plan: General   Post-op Pain Management:    Induction:   Airway Management Planned:   Additional Equipment:   Intra-op Plan:   Post-operative Plan:   Informed Consent: I have reviewed the patients History and Physical, chart, labs and discussed the procedure including the risks, benefits and alternatives for the proposed anesthesia with the patient or authorized representative who has indicated his/her understanding and acceptance.   Dental Advisory Given  Plan Discussed with: Anesthesiologist, CRNA and Surgeon  Anesthesia Plan Comments:         Anesthesia Quick Evaluation

## 2015-03-23 ENCOUNTER — Encounter: Payer: Self-pay | Admitting: Unknown Physician Specialty

## 2015-04-01 ENCOUNTER — Telehealth: Payer: Self-pay | Admitting: Family Medicine

## 2015-04-01 NOTE — Telephone Encounter (Signed)
Pt wanted to see if Dr. Elease Hashimoto could send something into to [facility] [place] for hot flashes. Pt stated she had taken Estradiol before but had to stop due to an ovarian cyst. Please advise. Thanks TNP

## 2015-04-01 NOTE — Telephone Encounter (Signed)
Really is not another effective treatment. Can try Clonidine but would need ov to see if BP can tolerate it. Thanks.

## 2015-04-01 NOTE — Telephone Encounter (Signed)
Pt advised. She says she is going to think about it and call to schedule an appointment if she decides to start Clonidine.   Thanks,   -Vernona Rieger

## 2015-04-03 DIAGNOSIS — M7701 Medial epicondylitis, right elbow: Secondary | ICD-10-CM | POA: Diagnosis not present

## 2015-04-03 DIAGNOSIS — M25561 Pain in right knee: Secondary | ICD-10-CM | POA: Diagnosis not present

## 2015-05-03 ENCOUNTER — Encounter: Payer: Self-pay | Admitting: Family Medicine

## 2015-05-03 ENCOUNTER — Ambulatory Visit (INDEPENDENT_AMBULATORY_CARE_PROVIDER_SITE_OTHER): Payer: PPO | Admitting: Family Medicine

## 2015-05-03 VITALS — BP 144/64 | HR 76 | Temp 98.1°F | Resp 16 | Wt 140.0 lb

## 2015-05-03 DIAGNOSIS — I10 Essential (primary) hypertension: Secondary | ICD-10-CM

## 2015-05-03 DIAGNOSIS — N83209 Unspecified ovarian cyst, unspecified side: Secondary | ICD-10-CM | POA: Insufficient documentation

## 2015-05-03 DIAGNOSIS — Z780 Asymptomatic menopausal state: Secondary | ICD-10-CM

## 2015-05-03 MED ORDER — ESTRADIOL 0.5 MG PO TABS: 0.5000 mg | ORAL_TABLET | Freq: Every day | ORAL | Status: DC

## 2015-05-03 NOTE — Progress Notes (Signed)
Patient ID: Margaree Mackintoshatricia R Chaudhuri, female   DOB: Jul 19, 1940, 75 y.o.   MRN: [id number]         Patient: Margaree Mackintoshatricia R Jahr Female    DOB: Jul 19, 1940   75 y.o.   MRN: [id number] Visit Date: 05/03/2015  Today's Provider: Lorie PhenixNancy Shelbylynn Walczyk, MD   Chief Complaint  Patient presents with  . Hypertension   Subjective:    Hypertension This is a chronic problem. The problem has been gradually worsening since onset. The problem is uncontrolled (Pt reports her blood pressures are 120-140's / 60-70's). Associated symptoms include headaches and sweats. Pertinent negatives include no anxiety, blurred vision, chest pain, malaise/fatigue, neck pain, palpitations, peripheral edema or shortness of breath. There are no known risk factors for coronary artery disease.    Thought she could come off estrogen. Having a lot of symptoms. Has tried to come off previously and has been unsuccessful.   Mammogram normal. No FH of breat Cancer.  No heart disease. Colonoscopy UTD.     Allergies  Allergen Reactions  . Codeine   . Penicillins Rash   Previous Medications   ALPRAZOLAM (XANAX) 0.25 MG TABLET    1/2 TAB BY MOUTH DAILY   CALCIUM CARBONATE (CALCIUM 600 PO)       ESOMEPRAZOLE (NEXIUM) 40 MG CAPSULE    Take by mouth.   GABAPENTIN (NEURONTIN) 100 MG CAPSULE    TAKE ONE CAPSULE BY MOUTH TWICE A DAY   MELOXICAM (MOBIC) 7.5 MG TABLET    Take by mouth.   MULTIPLE VITAMIN PO       NORTRIPTYLINE (PAMELOR) 50 MG CAPSULE    TAKE 1 CAPSULE DAILY   SYNTHROID 125 MCG TABLET    TAKE 1 TABLET BY MOUTH EVERY DAY   TRAMADOL (ULTRAM) 50 MG TABLET    TAKE 1 TABLET BY MOUTH EVERY EVENING   TRIAMTERENE-HYDROCHLOROTHIAZIDE (MAXZIDE-25) 37.5-25 MG PER TABLET    Take by mouth. Reported on 03/22/2015   VITAMIN D, CHOLECALCIFEROL, 1000 UNITS CAPS        Review of Systems  Constitutional: Positive for diaphoresis (Worsening hot flashes). Negative for fever, chills, malaise/fatigue, activity change, appetite change, fatigue and unexpected  weight change.  Eyes: Negative for blurred vision.  Respiratory: Negative.  Negative for shortness of breath.   Cardiovascular: Negative.  Negative for chest pain and palpitations.  Gastrointestinal: Negative for nausea, vomiting, abdominal pain, diarrhea, constipation, blood in stool, abdominal distention, anal bleeding and rectal pain.  Endocrine: Positive for heat intolerance. Negative for cold intolerance, polydipsia, polyphagia and polyuria.  Musculoskeletal: Negative for neck pain.  Neurological: Positive for headaches. Negative for dizziness and light-headedness.  Psychiatric/Behavioral: The patient is not nervous/anxious.     Social History  Substance Use Topics  . Smoking status: Never Smoker   . Smokeless tobacco: Never Used  . Alcohol Use: No   Objective:   BP 144/64 mmHg  Pulse 76  Temp(Src) 98.1 F (36.7 C) (Oral)  Resp 16  Wt 140 lb (63.504 kg)  Physical Exam  Constitutional: She is oriented to person, place, and time. She appears well-developed and well-nourished.  Neurological: She is alert and oriented to person, place, and time.  Psychiatric: She has a normal mood and affect. Her behavior is normal. Judgment and thought content normal.      Assessment & Plan:     1. Essential hypertension Currently up a little. Will monitor. May improve with estrogen. If not, will start taking Maxzide daily. Pt will call with her progress.  2. Postmenopausal Understands risks and benefits of estrogen. Just does not feel well off medication. Will restart.  Make sure to come for annual Wellness visits and mammograms. Currently UTD.  - estradiol (ESTRACE) 0.5 MG tablet; Take 1 tablet (0.5 mg total) by mouth daily.  Dispense: 30 tablet; Refill: 5      Lorie Phenix, MD  [facility]

## 2015-05-07 DIAGNOSIS — N83202 Unspecified ovarian cyst, left side: Secondary | ICD-10-CM | POA: Diagnosis not present

## 2015-05-28 ENCOUNTER — Other Ambulatory Visit: Payer: Self-pay | Admitting: Family Medicine

## 2015-05-28 DIAGNOSIS — I10 Essential (primary) hypertension: Secondary | ICD-10-CM

## 2015-06-12 ENCOUNTER — Encounter: Payer: Self-pay | Admitting: Family Medicine

## 2015-06-12 ENCOUNTER — Ambulatory Visit (INDEPENDENT_AMBULATORY_CARE_PROVIDER_SITE_OTHER): Payer: PPO | Admitting: Family Medicine

## 2015-06-12 ENCOUNTER — Other Ambulatory Visit: Payer: Self-pay

## 2015-06-12 VITALS — BP 128/70 | HR 72 | Temp 98.0°F | Resp 16 | Wt 143.0 lb

## 2015-06-12 DIAGNOSIS — N959 Unspecified menopausal and perimenopausal disorder: Secondary | ICD-10-CM | POA: Diagnosis not present

## 2015-06-12 DIAGNOSIS — I10 Essential (primary) hypertension: Secondary | ICD-10-CM | POA: Diagnosis not present

## 2015-06-12 DIAGNOSIS — F411 Generalized anxiety disorder: Secondary | ICD-10-CM

## 2015-06-12 MED ORDER — ESTRADIOL 0.5 MG PO TABS: ORAL_TABLET | ORAL | Status: DC

## 2015-06-12 MED ORDER — ALPRAZOLAM 0.25 MG PO TABS: ORAL_TABLET | ORAL | Status: DC

## 2015-06-12 MED ORDER — ESTRADIOL 1 MG PO TABS: 1.0000 mg | ORAL_TABLET | Freq: Every day | ORAL | Status: DC

## 2015-06-12 NOTE — Progress Notes (Signed)
Patient ID: Jamie [name], female   DOB: 01-07-1941, 75 y.o.   MRN: [id number]         Patient: Jamie [name] Female    DOB: 1940-07-25   75 y.o.   MRN: [id number] Visit Date: 06/12/2015  Today's Provider: Lorie Phenix, MD   Chief Complaint  Patient presents with  . Hypertension  . Hot Flashes   Subjective:    Hypertension This is a chronic problem. Pertinent negatives include no anxiety, blurred vision, chest pain, headaches, malaise/fatigue, palpitations, peripheral edema, shortness of breath or sweats. There are no compliance problems.     Hormone Replacement Counseling: Patient presents to discuss hormone replacement therapy. Patient is requesting hormone replacement therapy due to hot flashes, moodiness and family history of heart disease. The patient is taking hormone replacement therapy.  Patient denies post-menopausal vaginal bleeding. The patient is sexually active. last mammogram: was normal. Patient is hormone deficient due to menopause, which occurred at age 62's.  The patient currently has symptoms of hot flashes. Symptoms in past had included hot flashes, moodiness.  Current hormone therapy:  Estrogen: Estradiol 0.5mg  Previous hormone therapy:  Estrogen: Estradiol 0.5 mg Reason for starting HRT: hot flashes and moodiness   Anxiety under good control with medication as prescribed and wishes to get refill.     Allergies  Allergen Reactions  . Codeine   . Penicillins Rash   Previous Medications   CALCIUM CARBONATE (CALCIUM 600 PO)       ESOMEPRAZOLE (NEXIUM) 40 MG CAPSULE    Take by mouth.   MELOXICAM (MOBIC) 7.5 MG TABLET    Take by mouth.   MULTIPLE VITAMIN PO       NORTRIPTYLINE (PAMELOR) 50 MG CAPSULE    TAKE 1 CAPSULE DAILY   SYNTHROID 125 MCG TABLET    TAKE 1 TABLET BY MOUTH EVERY DAY   TRAMADOL (ULTRAM) 50 MG TABLET    TAKE 1 TABLET BY MOUTH EVERY EVENING   TRIAMTERENE-HYDROCHLOROTHIAZIDE (MAXZIDE-25) 37.5-25 MG TABLET    TAKE 1 TABLET BY MOUTH  EVERYDAY   VITAMIN D, CHOLECALCIFEROL, 1000 UNITS CAPS        Review of Systems  Constitutional: Negative.  Negative for malaise/fatigue.  Eyes: Negative for blurred vision.  Respiratory: Negative.  Negative for shortness of breath.   Cardiovascular: Negative.  Negative for chest pain and palpitations.  Gastrointestinal: Negative.   Musculoskeletal: Negative.   Neurological: Negative for dizziness, light-headedness and headaches.    Social History  Substance Use Topics  . Smoking status: Never Smoker   . Smokeless tobacco: Never Used  . Alcohol Use: No   Objective:   BP 128/70 mmHg  Pulse 72  Temp(Src) 98 F (36.7 C) (Oral)  Resp 16  Wt 143 lb (64.864 kg)  Physical Exam  Constitutional: She is oriented to person, place, and time. She appears well-developed and well-nourished.  Neurological: She is alert and oriented to person, place, and time.  Skin: Skin is warm and dry.  Psychiatric: She has a normal mood and affect. Her behavior is normal. Judgment and thought content normal.      Assessment & Plan:     1. Essential hypertension Stable; continue current medications.  Pt only taking Maxzide as needed.   2. Menopausal and perimenopausal disorder Improved but not to goal.  Will increase to  a day.  Pt advised to call and report how she is feeling. Has had normal mammogram. Understands are risks to estrogen, but is really uncomfortable, is really  affecting sleep and wishes to proceed.   - estradiol (ESTRACE) 1 MG tablet; Take 1 tablet (1 mg total) by mouth daily.  Dispense: 30 tablet; Refill: 0  3. Anxiety state Stable; refills provided.  - ALPRAZolam (XANAX) 0.25 MG tablet; 1/2 TAB BY MOUTH DAILY  Dispense: 45 tablet; Refill: 1     Patient was seen and examined by Leo GrosserNancy J. Clista Rainford, MD, and note scribed by Kavin LeechLaura Walsh, CMA.  I have reviewed the document for accuracy and completeness and I agree with above. - Leo GrosserNancy J. Vaughan Garfinkle, MD   Lorie PhenixNancy Abeera Flannery, MD  [facility]

## 2015-06-12 NOTE — Telephone Encounter (Signed)
Patient is requesting that a nurse Vernona Rieger(Laura) gives her a call back. She reports that she has a question about her medication that was discussed at today's visit. Contact info is correct. Thanks!

## 2015-06-12 NOTE — Telephone Encounter (Signed)
Pt would like to try Estradiol 0.5mg  1 1/2 Tablets a day instead of going to 1mg  a day.  [facility] [place]   Thanks,   -Vernona RiegerLaura

## 2015-07-22 ENCOUNTER — Other Ambulatory Visit: Payer: Self-pay | Admitting: Family Medicine

## 2015-07-22 DIAGNOSIS — E039 Hypothyroidism, unspecified: Secondary | ICD-10-CM

## 2015-08-05 ENCOUNTER — Other Ambulatory Visit: Payer: Self-pay | Admitting: Family Medicine

## 2015-08-30 DIAGNOSIS — H353131 Nonexudative age-related macular degeneration, bilateral, early dry stage: Secondary | ICD-10-CM | POA: Diagnosis not present

## 2015-08-30 DIAGNOSIS — H16143 Punctate keratitis, bilateral: Secondary | ICD-10-CM | POA: Diagnosis not present

## 2015-09-12 DIAGNOSIS — H16141 Punctate keratitis, right eye: Secondary | ICD-10-CM | POA: Diagnosis not present

## 2015-11-07 ENCOUNTER — Other Ambulatory Visit: Payer: Self-pay | Admitting: Family Medicine

## 2015-11-07 DIAGNOSIS — F411 Generalized anxiety disorder: Secondary | ICD-10-CM

## 2015-11-07 MED ORDER — ALPRAZOLAM 0.25 MG PO TABS: ORAL_TABLET | ORAL | 2 refills | Status: DC

## 2015-11-07 NOTE — Telephone Encounter (Signed)
Please call in alprazolam.  

## 2015-11-07 NOTE — Telephone Encounter (Signed)
Pt contacted office for refill request on the following medications: ALPRAZolam (XANAX) 0.25 MG tablet.  [facility] [place]  WU#[phone]/NFB#[phone]/MW  This is a Dr Elease HashimotoMaloney pt.

## 2015-11-12 DIAGNOSIS — N83202 Unspecified ovarian cyst, left side: Secondary | ICD-10-CM | POA: Diagnosis not present

## 2015-12-06 ENCOUNTER — Telehealth: Payer: Self-pay | Admitting: Family Medicine

## 2015-12-06 NOTE — Telephone Encounter (Signed)
error 

## 2015-12-08 IMAGING — US US PELVIS COMPLETE
1 series · 13 of 25 positions shown · non-contrast
Comparison: MRI of the abdomen of October 05, 2014.

CLINICAL DATA: Abnormal MRI of the pelvis demonstrating a cystic
lesion in the left hemipelvis ; history of hysterectomy in 4880,
status of ovaries is un clear.



[Series 1: us pelvis complete · 0.28mm/px · 13 of 58 slices shown]
[im 1/58]
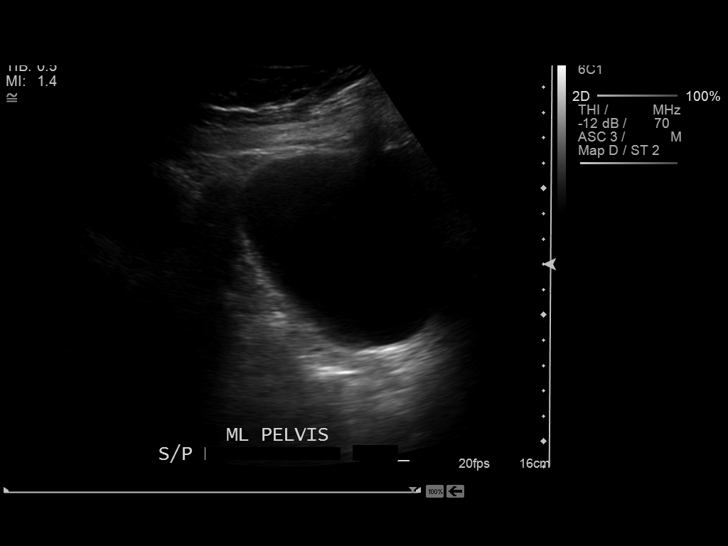
[im 5/58]
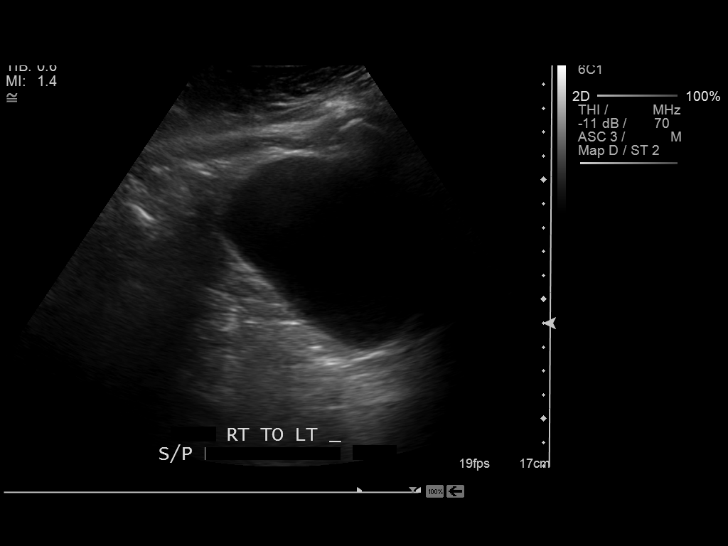
[im 10/58]
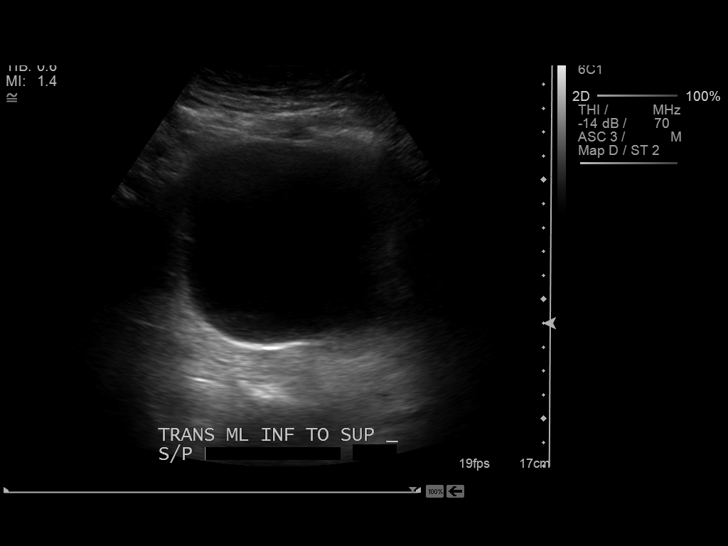
[im 15/58]
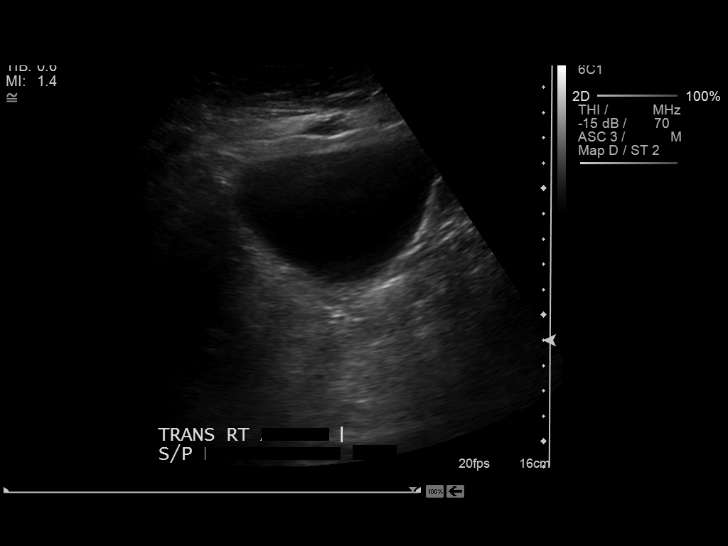
[im 20/58]
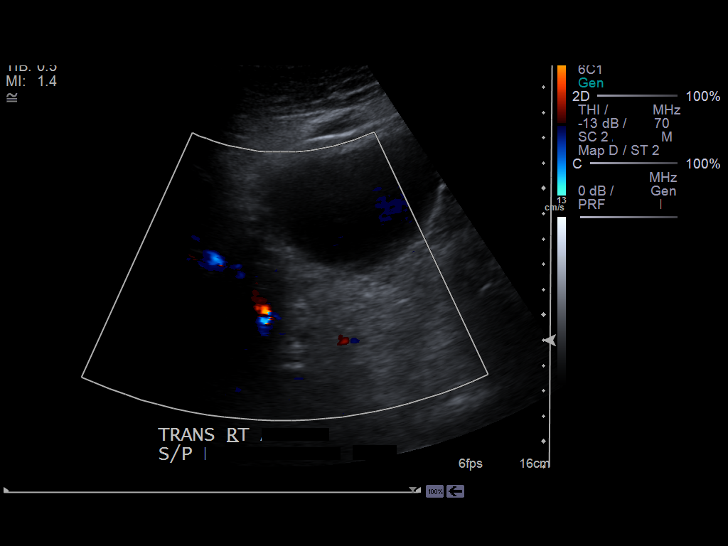
[im 24/58]
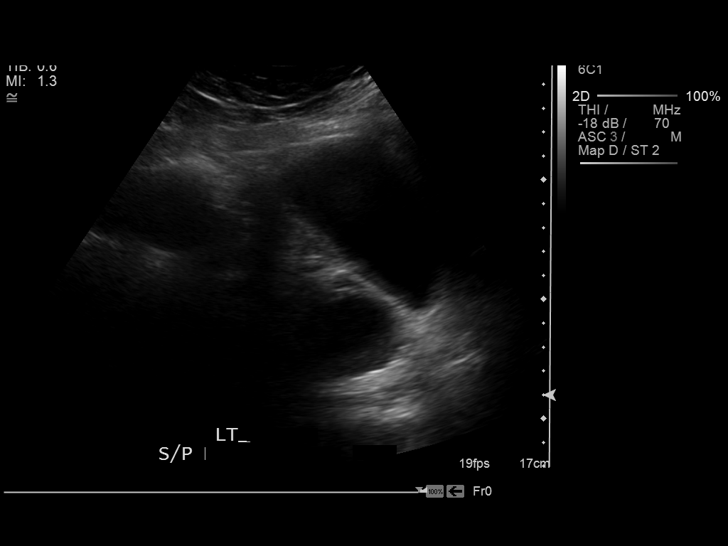
[im 29/58]
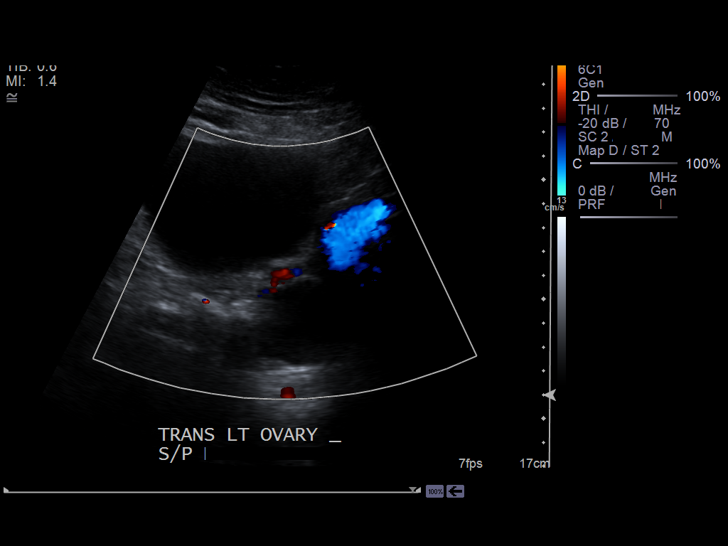
[im 34/58]
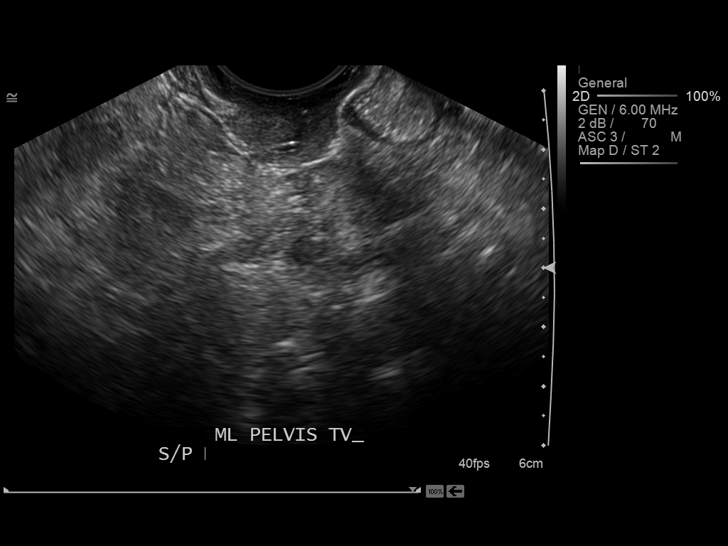
[im 39/58]
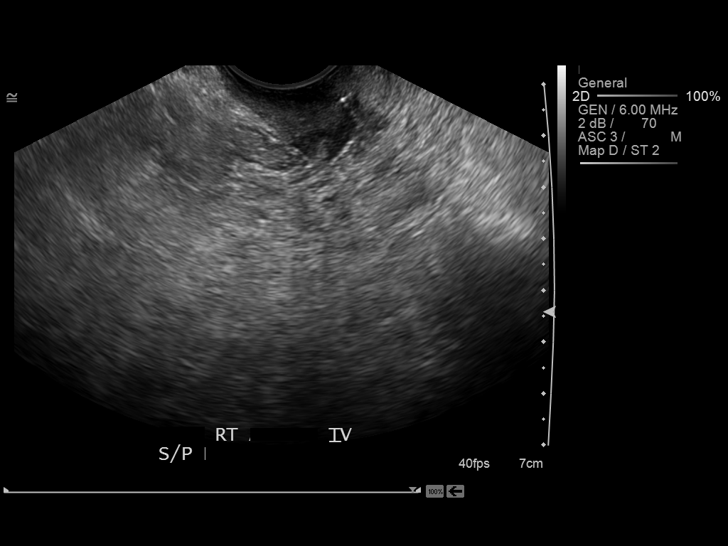
[im 43/58]
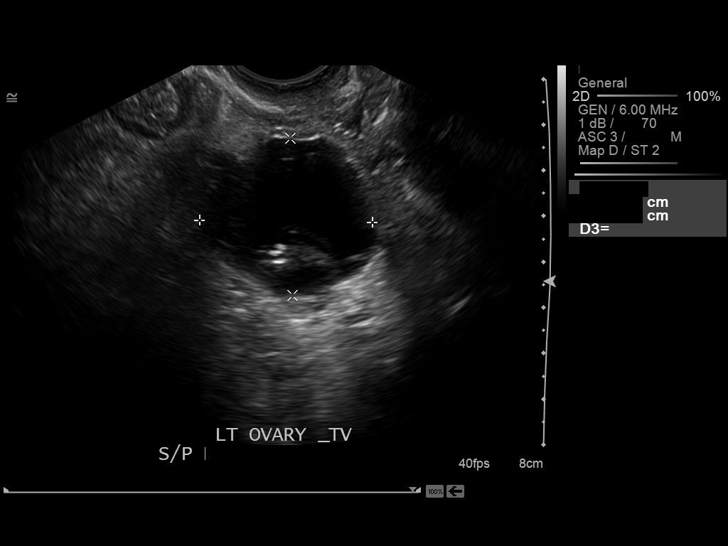
[im 48/58]
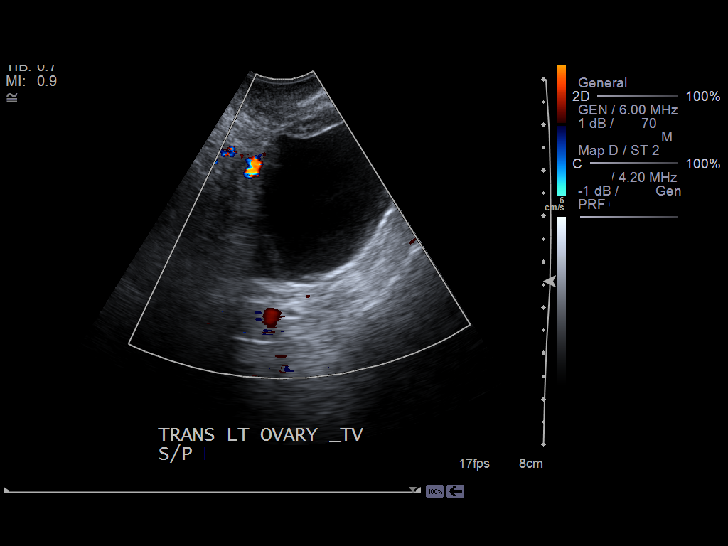
[im 53/58]
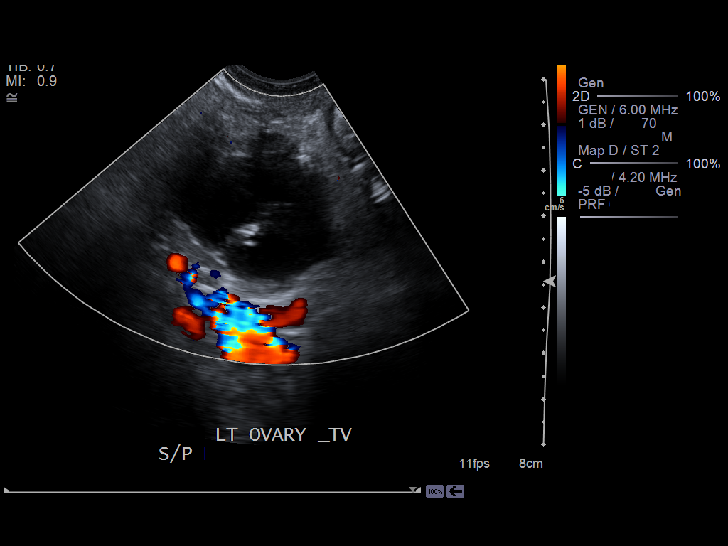
[im 58/58]
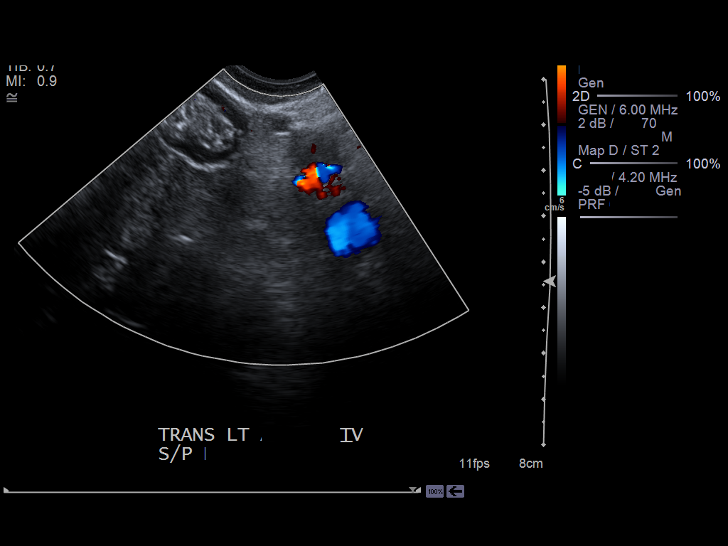

[13 of 25 positions shown; findings below may reference images not displayed]

FINDINGS: Uterus

The uterus is surgically absent.

Right ovary

The right ovary was not definitely demonstrated.  Next item

Left ovary

The left over was not definitely identified. There is a septated
cystic structure in the left adnexal region measuring 3.3 x 3.8 x
3.4 cm. A may be a mural or intramural nodule. There are prominent
echogenic internal echoes. There is no hypervascularity.

Other findings

No free fluid.
IMPRESSION: 1. There is a septated cystic adnexal mass on the left which may
contain calcifications. It is nonspecific in appearance please see
the previous MRI report. Gynecological evaluation is recommended.
Noncontrast pelvic CT scanning may be useful to assess the mass for
calcification.
2. The uterus is surgically absent. The ovaries were not discretely
demonstrated.

## 2015-12-12 ENCOUNTER — Ambulatory Visit (INDEPENDENT_AMBULATORY_CARE_PROVIDER_SITE_OTHER): Payer: PPO

## 2015-12-12 DIAGNOSIS — Z23 Encounter for immunization: Secondary | ICD-10-CM | POA: Diagnosis not present

## 2015-12-28 DIAGNOSIS — B9689 Other specified bacterial agents as the cause of diseases classified elsewhere: Secondary | ICD-10-CM | POA: Diagnosis not present

## 2015-12-28 DIAGNOSIS — J329 Chronic sinusitis, unspecified: Secondary | ICD-10-CM | POA: Diagnosis not present

## 2016-01-13 ENCOUNTER — Ambulatory Visit (INDEPENDENT_AMBULATORY_CARE_PROVIDER_SITE_OTHER): Payer: PPO | Admitting: Physician Assistant

## 2016-01-13 ENCOUNTER — Encounter: Payer: Self-pay | Admitting: Physician Assistant

## 2016-01-13 VITALS — BP 140/72 | HR 80 | Temp 97.9°F | Resp 16 | Ht 66.0 in | Wt 144.0 lb

## 2016-01-13 DIAGNOSIS — E039 Hypothyroidism, unspecified: Secondary | ICD-10-CM | POA: Diagnosis not present

## 2016-01-13 DIAGNOSIS — Z1231 Encounter for screening mammogram for malignant neoplasm of breast: Secondary | ICD-10-CM | POA: Diagnosis not present

## 2016-01-13 DIAGNOSIS — M5432 Sciatica, left side: Secondary | ICD-10-CM | POA: Diagnosis not present

## 2016-01-13 DIAGNOSIS — N959 Unspecified menopausal and perimenopausal disorder: Secondary | ICD-10-CM

## 2016-01-13 DIAGNOSIS — M62838 Other muscle spasm: Secondary | ICD-10-CM | POA: Diagnosis not present

## 2016-01-13 DIAGNOSIS — M542 Cervicalgia: Secondary | ICD-10-CM

## 2016-01-13 DIAGNOSIS — Z1239 Encounter for other screening for malignant neoplasm of breast: Secondary | ICD-10-CM

## 2016-01-13 DIAGNOSIS — Z0000 Encounter for general adult medical examination without abnormal findings: Secondary | ICD-10-CM

## 2016-01-13 DIAGNOSIS — E7800 Pure hypercholesterolemia, unspecified: Secondary | ICD-10-CM

## 2016-01-13 DIAGNOSIS — I10 Essential (primary) hypertension: Secondary | ICD-10-CM

## 2016-01-13 MED ORDER — TIZANIDINE HCL 4 MG PO TABS: 2.0000 mg | ORAL_TABLET | Freq: Four times a day (QID) | ORAL | 1 refills | Status: DC | PRN

## 2016-01-13 MED ORDER — ESTRADIOL 0.5 MG PO TABS: 0.5000 mg | ORAL_TABLET | Freq: Every day | ORAL | 1 refills | Status: DC

## 2016-01-13 MED ORDER — TRAMADOL HCL 50 MG PO TABS: ORAL_TABLET | ORAL | 1 refills | Status: DC

## 2016-01-13 NOTE — Patient Instructions (Signed)

## 2016-01-13 NOTE — Progress Notes (Signed)
Patient: Jamie [name], Female    DOB: September 03, 1940, 75 y.o.   MRN: [id number] Visit Date: 01/13/2016  Today's Provider: Margaretann Loveless, PA-C   Chief Complaint  Patient presents with  . Medicare Wellness  . Neck Pain   Subjective:    Annual wellness visit Jamie [name] is a 75 y.o. female. She feels well. She reports exercising daily. She reports she is sleeping well.  12/25/14 AWE 01/16/15 Mammogram-BI-RADS 1 01/16/15 BMD-osteoporosis 03/22/15 Colonoscopy-diverticulosis ----------------------------------------------------------- Neck Pain: Patient c/o of neck pain right more than left. Patient is requesting refill on Tizanidine HCl 4 mg. Patient reports she takes Tizanidine as needed. Last refilled on 06/01/2013.   Review of Systems  Constitutional: Negative.   HENT: Negative.   Eyes: Negative.   Respiratory: Negative.   Cardiovascular: Negative.   Gastrointestinal: Negative.   Endocrine: Negative.   Genitourinary: Negative.   Musculoskeletal: Negative.   Skin: Negative.   Allergic/Immunologic: Negative.   Neurological: Negative.   Hematological: Negative.   Psychiatric/Behavioral: Negative.     Social History   Social History  . Marital status: Married    Spouse name: N/A  . Number of children: N/A  . Years of education: N/A   Occupational History  . Not on file.   Social History Main Topics  . Smoking status: Never Smoker  . Smokeless tobacco: Never Used  . Alcohol use No  . Drug use: No  . Sexual activity: Not on file   Other Topics Concern  . Not on file   Social History Narrative  . No narrative on file    Past Medical History:  Diagnosis Date  . Anemia   . Anxiety   . Arthritis   . GERD (gastroesophageal reflux disease)   . Headache   . Hypertension   . Hypothyroidism      Patient Active Problem List   Diagnosis Date Noted  . Cyst of ovary 05/03/2015  . Medicare annual wellness visit, subsequent 12/25/2014  . Foot  pain 11/22/2014  . Dysfunction of eustachian tube 11/20/2014  . Fecal occult blood test positive 11/20/2014  . Hypercholesteremia 11/20/2014  . BP (high blood pressure) 11/20/2014  . Cardiac murmur 11/20/2014  . Temporary cerebral vascular dysfunction 11/20/2014  . Gout attack 11/20/2014  . Swelling of left lower extremity 11/20/2014  . Shortness of breath 11/20/2014  . Brain disorder 11/20/2014  . Essential (primary) hypertension 11/20/2014  . L-S radiculopathy 09/27/2014  . Migraine 07/31/2014  . Hypothyroidism 07/31/2014  . Headache, migraine 07/31/2014  . Colon polyp 12/24/2009  . LBP (low back pain) 06/28/2009  . Anxiety state 10/26/2008  . Accumulation of fluid in tissues 10/26/2008  . Acid reflux 10/26/2008  . Esophageal stenosis 10/26/2008  . Hemorrhoids, internal 10/26/2008  . Menopausal and perimenopausal disorder 10/26/2008  . Gastro-esophageal reflux disease without esophagitis 10/26/2008  . Edema 10/26/2008  . Allergic rhinitis 07/13/2006    Past Surgical History:  Procedure Laterality Date  . ABDOMINAL HYSTERECTOMY  1980  . APPENDECTOMY    . CATARACT EXTRACTION Bilateral 2014  . CHOLECYSTECTOMY    . COLONOSCOPY WITH PROPOFOL N/A 03/22/2015   Procedure: COLONOSCOPY WITH PROPOFOL;  Surgeon: Scot Jun, MD;  Location: [facility] ENDOSCOPY;  Service: Endoscopy;  Laterality: N/A;  . TUBAL LIGATION      Her family history includes Diabetes in her brother; Emphysema in her father; Healthy in her sister; Heart attack in her father; Hyperlipidemia in her mother; Lymphoma in her brother; Stroke  in her maternal grandmother.     Current Meds  Medication Sig  . ALPRAZolam (XANAX) 0.25 MG tablet 1/2 TAB BY MOUTH DAILY  . Calcium Carbonate (CALCIUM 600 PO) Take 1 tablet by mouth daily.   Marland Kitchen. esomeprazole (NEXIUM) 40 MG capsule Take 40 mg by mouth daily at 12 noon.   Marland Kitchen. estradiol (ESTRACE) 0.5 MG tablet Take 1 1/2 tablets a day. (Patient taking differently: Take 0.5 mg by  mouth daily. Take 1 tablets a day.)  . meloxicam (MOBIC) 7.5 MG tablet Take 7.5 mg by mouth as needed.   . MULTIPLE VITAMIN PO Take 1 tablet by mouth daily.   . nortriptyline (PAMELOR) 50 MG capsule TAKE 1 CAPSULE DAILY  . SYNTHROID 125 MCG tablet TAKE 1 TABLET BY MOUTH EVERY DAY  . traMADol (ULTRAM) 50 MG tablet TAKE 1 TABLET BY MOUTH EVERY EVENING (Patient taking differently: TAKE 1 TABLET BY MOUTH EVERY EVENING AS NEEDED)  . triamterene-hydrochlorothiazide (MAXZIDE-25) 37.5-25 MG tablet TAKE 1 TABLET BY MOUTH EVERYDAY (Patient taking differently: TAKE 1/2 TABLET BY MOUTH EVERYDAY AS NEEDED)  . Vitamin D, Cholecalciferol, 1000 UNITS CAPS Take 1 capsule by mouth daily.     Patient Care Team: Margaretann LovelessJennifer M Jerzey Komperda, PA-C as PCP - General (Family Medicine)     Objective:   Vitals: BP 140/72 (BP Location: Left Arm, Patient Position: Sitting, Cuff Size: Normal)   Pulse 80   Temp 97.9 F (36.6 C) (Oral)   Resp 16   Ht 5\' 6"  (1.676 m)   Wt 144 lb (65.3 kg)   BMI 23.24 kg/m   Physical Exam  Constitutional: She is oriented to person, place, and time. She appears well-developed and well-nourished. No distress.  HENT:  Head: Normocephalic and atraumatic.  Right Ear: External ear normal.  Left Ear: External ear normal.  Nose: Nose normal.  Mouth/Throat: Oropharynx is clear and moist. No oropharyngeal exudate.  Eyes: Conjunctivae and EOM are normal. Pupils are equal, round, and reactive to light. Right eye exhibits no discharge. Left eye exhibits no discharge. No scleral icterus.  Neck: Normal range of motion. Neck supple. No JVD present. No tracheal deviation present. No thyromegaly present.  Cardiovascular: Normal rate, regular rhythm and intact distal pulses.  Exam reveals no gallop and no friction rub.   Murmur heard. Pulmonary/Chest: Effort normal and breath sounds normal. No respiratory distress. She has no wheezes. She has no rales. She exhibits no tenderness. Right breast exhibits no  inverted nipple, no mass, no nipple discharge, no skin change and no tenderness. Left breast exhibits no inverted nipple, no mass, no nipple discharge, no skin change and no tenderness. Breasts are symmetrical.  Abdominal: Soft. Bowel sounds are normal. She exhibits no distension and no mass. There is no tenderness. There is no rebound and no guarding.  Musculoskeletal: Normal range of motion. She exhibits no edema or tenderness.  Lymphadenopathy:    She has no cervical adenopathy.  Neurological: She is alert and oriented to person, place, and time. She has normal reflexes.  Skin: Skin is warm and dry. No rash noted. She is not diaphoretic.  Psychiatric: She has a normal mood and affect. Her behavior is normal. Judgment and thought content normal.  Vitals reviewed.   Activities of Daily Living In your present state of health, do you have any difficulty performing the following activities: 01/13/2016  Hearing? N  Vision? N  Difficulty concentrating or making decisions? N  Walking or climbing stairs? N  Dressing or bathing? N  Doing  errands, shopping? N  Some recent data might be hidden    Fall Risk Assessment Fall Risk  01/13/2016 12/25/2014  Falls in the past year? No No     Depression Screen PHQ 2/9 Scores 01/13/2016 12/25/2014  PHQ - 2 Score 0 0    Cognitive Testing - 6-CIT  Correct? Score   What year is it? yes 0 0 or 4  What month is it? yes 0 0 or 3  Memorize:    Floyde Parkins,  43,  [place] [address],  [place],      What time is it? (within 1 hour) yes 0 0 or 3  Count backwards from 20 yes 0 0, 2, or 4  Name the months of the year yes 0 0, 2, or 4  Repeat name & address above yes 2 0, 2, 4, 6, 8, or 10       TOTAL SCORE  2/28   Interpretation:  Normal  Normal (0-7) Abnormal (8-28)   Audit-C Alcohol Use Screening  Question Answer Points  How often do you have alcoholic drink? never 0  On days you do drink alcohol, how many drinks do you typically consume? 0 0  How oftey  will you drink 6 or more in a total? never 0  Total Score:  0   A score of 3 or more in women, and 4 or more in men indicates increased risk for alcohol abuse, EXCEPT if all of the points are from question 1.   Assessment & Plan:     Annual Wellness Visit  Reviewed patient's Family Medical History Reviewed and updated list of patient's medical providers Assessment of cognitive impairment was done Assessed patient's functional ability Established a written schedule for health screening services Health Risk Assessent Completed and Reviewed  Exercise Activities and Dietary recommendations Goals    None      Immunization History  Administered Date(s) Administered  . Influenza, High Dose Seasonal PF 01/03/2015, 12/12/2015  . Influenza-Unspecified 10/24/2013  . Pneumococcal Conjugate-13 01/10/2014  . Pneumococcal Polysaccharide-23 05/15/2010    Health Maintenance  Topic Date Due  . TETANUS/TDAP  01/17/1961  . ZOSTAVAX  01/17/2002  . MAMMOGRAM  01/15/2017  . COLONOSCOPY  03/21/2025  . INFLUENZA VACCINE  Completed  . DEXA SCAN  Completed  . PNA vac Low Risk Adult  Completed     Discussed health benefits of physical activity, and encouraged her to engage in regular exercise appropriate for her age and condition.    1. Medicare annual wellness visit, subsequent Physical exam today.  2. Essential hypertension Stable. Will check labs as below and f/u pending results. - CBC with Differential/Platelet - Comprehensive metabolic panel  3. Hypothyroidism, unspecified type Stable. Continue Synthroid 125 g. Will check labs as below. - TSH  4. Hypercholesteremia Stable.Will check labs as below and f/u pending results. - Lipid Panel With LDL/HDL Ratio  5. Menopausal and perimenopausal disorder Stable. Diagnosis pulled for medication refill. Continue current medical treatment plan. - estradiol (ESTRACE) 0.5 MG tablet; Take 1 tablet (0.5 mg total) by mouth daily.  Dispense:  90 tablet; Refill: 1  6. Muscle spasm Stable. Diagnosis pulled for medication refill. Continue current medical treatment plan. Previously seen by Dr. Gavin Potters. - traMADol (ULTRAM) 50 MG tablet; TAKE 1 TABLET BY MOUTH EVERY EVENING AS NEEDED  Dispense: 90 tablet; Refill: 1 - tiZANidine (ZANAFLEX) 4 MG tablet; Take 0.5-1 tablets (2-4 mg total) by mouth every 6 (six) hours as needed for muscle spasms.  Dispense:  90 tablet; Refill: 1  7. Breast cancer screening Breast exam today was normal. There is no family history of breast cancer. She does perform regular self breast exams. Mammogram was ordered as below. [facility] was given to patient so she may schedule her mammogram at her convenience. - MM Digital Screening; Future  8. Neck pain Stable. Diagnosis pulled for medication refill. Continue current medical treatment plan. - traMADol (ULTRAM) 50 MG tablet; TAKE 1 TABLET BY MOUTH EVERY EVENING AS NEEDED  Dispense: 90 tablet; Refill: 1  9. Sciatica of left side Stable. Diagnosis pulled for medication refill. Continue current medical treatment plan. - traMADol (ULTRAM) 50 MG tablet; TAKE 1 TABLET BY MOUTH EVERY EVENING AS NEEDED  Dispense: 90 tablet; Refill: 1  ------------------------------------------------------------------------------------------------------------    Margaretann LovelessJennifer M Kaydan Wilhoite, PA-C  [facility]

## 2016-02-06 DIAGNOSIS — M5136 Other intervertebral disc degeneration, lumbar region: Secondary | ICD-10-CM | POA: Diagnosis not present

## 2016-02-06 DIAGNOSIS — M5416 Radiculopathy, lumbar region: Secondary | ICD-10-CM | POA: Diagnosis not present

## 2016-02-06 DIAGNOSIS — M48062 Spinal stenosis, lumbar region with neurogenic claudication: Secondary | ICD-10-CM | POA: Diagnosis not present

## 2016-02-10 DIAGNOSIS — I10 Essential (primary) hypertension: Secondary | ICD-10-CM | POA: Diagnosis not present

## 2016-02-10 DIAGNOSIS — E039 Hypothyroidism, unspecified: Secondary | ICD-10-CM | POA: Diagnosis not present

## 2016-02-10 DIAGNOSIS — E7800 Pure hypercholesterolemia, unspecified: Secondary | ICD-10-CM | POA: Diagnosis not present

## 2016-02-11 ENCOUNTER — Telehealth: Payer: Self-pay

## 2016-02-11 LAB — COMPREHENSIVE METABOLIC PANEL
ALT: 21 IU/L (ref 0–32)
AST: 21 IU/L (ref 0–40)
Albumin/Globulin Ratio: 1.6 (ref 1.2–2.2)
Albumin: 4.3 g/dL (ref 3.5–4.8)
Alkaline Phosphatase: 70 IU/L (ref 39–117)
BUN/Creatinine Ratio: 19 (ref 12–28)
BUN: 16 mg/dL (ref 8–27)
Bilirubin Total: <0.2 mg/dL (ref 0.0–1.2)
CALCIUM: 9.6 mg/dL (ref 8.7–10.3)
CO2: 29 mmol/L (ref 18–29)
CREATININE: 0.84 mg/dL (ref 0.57–1.00)
Chloride: 95 mmol/L — ABNORMAL LOW (ref 96–106)
GFR calc Af Amer: 79 mL/min/{1.73_m2} (ref >59)
GFR, EST NON AFRICAN AMERICAN: 69 mL/min/{1.73_m2} (ref >59)
GLUCOSE: 99 mg/dL (ref 65–99)
Globulin, Total: 2.7 g/dL (ref 1.5–4.5)
POTASSIUM: 4.2 mmol/L (ref 3.5–5.2)
Sodium: 138 mmol/L (ref 134–144)
TOTAL PROTEIN: 7 g/dL (ref 6.0–8.5)

## 2016-02-11 LAB — CBC WITH DIFFERENTIAL/PLATELET
Basophils Absolute: 0 10*3/uL (ref 0.0–0.2)
Basos: 0 %
EOS (ABSOLUTE): 0.5 10*3/uL — AB (ref 0.0–0.4)
Eos: 7 %
Hematocrit: 38.1 % (ref 34.0–46.6)
Hemoglobin: 12.2 g/dL (ref 11.1–15.9)
IMMATURE GRANS (ABS): 0 10*3/uL (ref 0.0–0.1)
IMMATURE GRANULOCYTES: 0 %
LYMPHS: 32 %
Lymphocytes Absolute: 2.2 10*3/uL (ref 0.7–3.1)
MCH: 26 pg — ABNORMAL LOW (ref 26.6–33.0)
MCHC: 32 g/dL (ref 31.5–35.7)
MCV: 81 fL (ref 79–97)
MONOS ABS: 0.7 10*3/uL (ref 0.1–0.9)
Monocytes: 10 %
NEUTROS PCT: 51 %
Neutrophils Absolute: 3.6 10*3/uL (ref 1.4–7.0)
PLATELETS: 427 10*3/uL — AB (ref 150–379)
RBC: 4.69 x10E6/uL (ref 3.77–5.28)
RDW: 15.6 % — AB (ref 12.3–15.4)
WBC: 6.9 10*3/uL (ref 3.4–10.8)

## 2016-02-11 LAB — LIPID PANEL WITH LDL/HDL RATIO
CHOLESTEROL TOTAL: 214 mg/dL — AB (ref 100–199)
HDL: 93 mg/dL (ref >39)
LDL CALC: 99 mg/dL (ref 0–99)
LDl/HDL Ratio: 1.1 ratio units (ref 0.0–3.2)
TRIGLYCERIDES: 111 mg/dL (ref 0–149)
VLDL CHOLESTEROL CAL: 22 mg/dL (ref 5–40)

## 2016-02-11 LAB — TSH: TSH: 2.89 u[IU]/mL (ref 0.450–4.500)

## 2016-02-11 NOTE — Telephone Encounter (Signed)
Advised pt of lab results. Pt verbally acknowledges understanding. Mailin Coglianese Drozdowski, CMA   

## 2016-02-11 NOTE — Telephone Encounter (Signed)
-----   Message from Margaretann LovelessJennifer M Burnette, PA-C sent at 02/11/2016  8:31 AM EST ----- All labs are within normal limits and stable.  Thanks! -JB

## 2016-03-06 ENCOUNTER — Telehealth: Payer: Self-pay

## 2016-03-06 ENCOUNTER — Ambulatory Visit
Admission: RE | Admit: 2016-03-06 | Discharge: 2016-03-06 | Disposition: A | Discharge status: Home / Self Care | Payer: PPO | Source: Ambulatory Visit | Attending: Physician Assistant | Admitting: Physician Assistant

## 2016-03-06 DIAGNOSIS — Z1231 Encounter for screening mammogram for malignant neoplasm of breast: Secondary | ICD-10-CM | POA: Diagnosis not present

## 2016-03-06 DIAGNOSIS — Z1239 Encounter for other screening for malignant neoplasm of breast: Secondary | ICD-10-CM

## 2016-03-06 NOTE — Telephone Encounter (Signed)
-----   Message from Margaretann LovelessJennifer M Burnette, PA-C sent at 03/06/2016  2:26 PM EST ----- Normal mammogram. Repeat screening in one year.

## 2016-03-06 NOTE — Telephone Encounter (Signed)
Pt advised. Deliana Avalos Drozdowski, CMA  

## 2016-04-22 ENCOUNTER — Ambulatory Visit (INDEPENDENT_AMBULATORY_CARE_PROVIDER_SITE_OTHER): Payer: PPO | Admitting: Physician Assistant

## 2016-04-22 ENCOUNTER — Encounter: Payer: Self-pay | Admitting: Physician Assistant

## 2016-04-22 VITALS — BP 128/60 | HR 78 | Temp 98.1°F | Resp 16 | Wt 145.6 lb

## 2016-04-22 DIAGNOSIS — M5442 Lumbago with sciatica, left side: Secondary | ICD-10-CM | POA: Diagnosis not present

## 2016-04-22 DIAGNOSIS — G8929 Other chronic pain: Secondary | ICD-10-CM

## 2016-04-22 DIAGNOSIS — I10 Essential (primary) hypertension: Secondary | ICD-10-CM

## 2016-04-22 MED ORDER — MELOXICAM 7.5 MG PO TABS: 7.5000 mg | ORAL_TABLET | ORAL | 1 refills | Status: DC | PRN

## 2016-04-22 NOTE — Progress Notes (Signed)
Patient: Jamie [name] Female    DOB: [birth year]   76 y.o.   MRN: [id number] Visit Date: 04/22/2016  Today's Provider: Margaretann LovelessJennifer M Kalana Yust, PA-C   Chief Complaint  Patient presents with  . Medication Refill  . Hypertension   Subjective:    HPI Patient is here today to discuss medication (Meloxicam) that was prescribed by Dr.Kernodle.  Sciatica: She reports pain is stable. She uses the Meloxicam dailly. She takes one tablet every morning and she is good the whole day.She reports she walks 3-4 miles a day.   Hypertension, follow-up:  BP Readings from Last 3 Encounters:  04/22/16 128/60  01/13/16 140/72  06/12/15 128/70    She was last seen for hypertension 3 months ago.  BP at that visit was none. Management since that visit includes none. She reports excellent compliance with treatment. She is not having side effects.  She is exercising. She is adherent to low salt diet.   She is experiencing none.  Patient denies chest pain, chest pressure/discomfort, exertional chest pressure/discomfort, fatigue, irregular heart beat, lower extremity edema, near-syncope and palpitations.   Cardiovascular risk factors include advanced age (older than 4655 for men, 3565 for women) and hypertension.  Use of agents associated with hypertension: none.     Weight trend: stable Wt Readings from Last 3 Encounters:  04/22/16 145 lb 9.6 oz (66 kg)  01/13/16 144 lb (65.3 kg)  06/12/15 143 lb (64.9 kg)    Current diet: in general, a "healthy" diet    ------------------------------------------------------------------------      Allergies  Allergen Reactions  . Codeine   . Penicillins Rash     Current Outpatient Prescriptions:  .  ALPRAZolam (XANAX) 0.25 MG tablet, 1/2 TAB BY MOUTH DAILY, Disp: 45 tablet, Rfl: 2 .  Calcium Carbonate (CALCIUM 600 PO), Take 1 tablet by mouth daily. , Disp: , Rfl:  .  esomeprazole (NEXIUM) 40 MG capsule, Take 40 mg by mouth daily at 12 noon. ,  Disp: , Rfl:  .  estradiol (ESTRACE) 0.5 MG tablet, Take 1 tablet (0.5 mg total) by mouth daily., Disp: 90 tablet, Rfl: 1 .  meloxicam (MOBIC) 7.5 MG tablet, Take 7.5 mg by mouth as needed. , Disp: , Rfl:  .  MULTIPLE VITAMIN PO, Take 1 tablet by mouth daily. , Disp: , Rfl:  .  nortriptyline (PAMELOR) 50 MG capsule, TAKE 1 CAPSULE DAILY, Disp: 90 capsule, Rfl: 3 .  SYNTHROID 125 MCG tablet, TAKE 1 TABLET BY MOUTH EVERY DAY, Disp: 90 tablet, Rfl: 3 .  tiZANidine (ZANAFLEX) 4 MG tablet, Take 0.5-1 tablets (2-4 mg total) by mouth every 6 (six) hours as needed for muscle spasms., Disp: 90 tablet, Rfl: 1 .  traMADol (ULTRAM) 50 MG tablet, TAKE 1 TABLET BY MOUTH EVERY EVENING AS NEEDED, Disp: 90 tablet, Rfl: 1 .  triamterene-hydrochlorothiazide (MAXZIDE-25) 37.5-25 MG tablet, TAKE 1 TABLET BY MOUTH EVERYDAY (Patient taking differently: TAKE 1/2 TABLET BY MOUTH EVERYDAY AS NEEDED), Disp: 90 tablet, Rfl: 1 .  Vitamin D, Cholecalciferol, 1000 UNITS CAPS, Take 1 capsule by mouth daily. , Disp: , Rfl:   Review of Systems  Constitutional: Negative for fatigue.  HENT: Positive for sneezing.   Cardiovascular: Negative for chest pain, palpitations and leg swelling.  Neurological: Negative for dizziness, light-headedness, numbness and headaches.    Social History  Substance Use Topics  . Smoking status: Never Smoker  . Smokeless tobacco: Never Used  . Alcohol use No   Objective:  BP 128/60 (BP Location: Right Arm, Patient Position: Sitting, Cuff Size: Normal)   Pulse 78   Temp 98.1 F (36.7 C) (Oral)   Resp 16   Wt 145 lb 9.6 oz (66 kg)   BMI 23.50 kg/m   Physical Exam  Constitutional: She appears well-developed and well-nourished. No distress.  Neck: Normal range of motion. Neck supple. No tracheal deviation present. No thyromegaly present.  Cardiovascular: Normal rate, regular rhythm and normal heart sounds.  Exam reveals no gallop and no friction rub.   No murmur heard. Pulmonary/Chest:  Effort normal and breath sounds normal. No respiratory distress. She has no wheezes. She has no rales.  Musculoskeletal: She exhibits no edema.  Lymphadenopathy:    She has no cervical adenopathy.  Skin: She is not diaphoretic.  Vitals reviewed.      Assessment & Plan:     1. Chronic bilateral low back pain with left-sided sciatica Stable. Diagnosis pulled for medication refill. Continue current medical treatment plan. - meloxicam (MOBIC) 7.5 MG tablet; Take 1 tablet (7.5 mg total) by mouth as needed.  Dispense: 90 tablet; Refill: 1  2. Essential (primary) hypertension Stable. Continue maxzide 37.5-25mg  taking 1/2 tab daily. I will see her back in Nov 2018 for her AWV and to recheck labs.        Margaretann Loveless, PA-C  [facility]

## 2016-04-22 NOTE — Patient Instructions (Signed)
Chronic Back Pain When back pain lasts longer than 3 months, it is called chronic back pain.The cause of your back pain may not be known. Some common causes include:  Wear and tear (degenerative disease) of the bones, ligaments, or disks in your back.  Inflammation and stiffness in your back (arthritis). People who have chronic back pain often go through certain periods in which the pain is more intense (flare-ups). Many people can learn to manage the pain with home care. Follow these instructions at home: Pay attention to any changes in your symptoms. Take these actions to help with your pain: Activity   Avoid bending and activities that make the problem worse.  Do not sit or stand in one place for long periods of time.  Take brief periods of rest throughout the day. This will reduce your pain. Resting in a lying or standing position is usually better than sitting to rest.  When you are resting for longer periods, mix in some mild activity or stretching between periods of rest. This will help to prevent stiffness and pain.  Get regular exercise. Ask your health care provider what activities are safe for you.  Do not lift anything that is heavier than 10 lb (4.5 kg). Always use proper lifting technique, which includes:  Bending your knees.  Keeping the load close to your body.  Avoiding twisting. Managing pain   If directed, apply ice to the painful area. Your health care provider may recommend applying ice during the first 24-48 hours after a flare-up begins.  Put ice in a plastic bag.  Place a towel between your skin and the bag.  Leave the ice on for 20 minutes, 2-3 times per day.  After icing, apply heat to the affected area as often as told by your health care provider. Use the heat source that your health care provider recommends, such as a moist heat pack or a heating pad.  Place a towel between your skin and the heat source.  Leave the heat on for 20-30  minutes.  Remove the heat if your skin turns bright red. This is especially important if you are unable to feel pain, heat, or cold. You may have a greater risk of getting burned.  Try soaking in a warm tub.  Take over-the-counter and prescription medicines only as told by your health care provider.  Keep all follow-up visits as told by your health care provider. This is important. Contact a health care provider if:  You have pain that is not relieved with rest or medicine. Get help right away if:  You have weakness or numbness in one or both of your legs or feet.  You have trouble controlling your bladder or your bowels.  You have nausea or vomiting.  You have pain in your abdomen.  You have shortness of breath or you faint. This information is not intended to replace advice given to you by your health care provider. Make sure you discuss any questions you have with your health care provider. Document Released: 03/19/2004 Document Revised: 06/20/2015 Document Reviewed: 07/30/2014 Elsevier Interactive Patient Education  2017 [employer] Inc[employer]  

## 2016-05-18 ENCOUNTER — Other Ambulatory Visit: Payer: Self-pay | Admitting: Family Medicine

## 2016-05-18 DIAGNOSIS — F411 Generalized anxiety disorder: Secondary | ICD-10-CM

## 2016-05-18 NOTE — Telephone Encounter (Signed)
Please call in alprazolam.  

## 2016-05-18 NOTE — Telephone Encounter (Signed)
rx called in-aa 

## 2016-07-15 ENCOUNTER — Other Ambulatory Visit: Payer: Self-pay | Admitting: Physician Assistant

## 2016-07-15 MED ORDER — SYNTHROID 125 MCG PO TABS: 125.0000 ug | ORAL_TABLET | Freq: Every day | ORAL | 0 refills | Status: DC

## 2016-07-15 NOTE — Telephone Encounter (Signed)
Last ov 01/13/16  Last filled 07/23/15 Please review. Thank you. sd

## 2016-07-15 NOTE — Telephone Encounter (Signed)
[facility] requesting [facility] refill  for SYNTHROID 125 MCG tablet 90 day supply

## 2016-07-21 ENCOUNTER — Other Ambulatory Visit: Payer: Self-pay | Admitting: Physician Assistant

## 2016-07-21 DIAGNOSIS — I10 Essential (primary) hypertension: Secondary | ICD-10-CM

## 2016-07-21 MED ORDER — TRIAMTERENE-HCTZ 37.5-25 MG PO TABS: ORAL_TABLET | ORAL | 1 refills | Status: DC

## 2016-07-21 NOTE — Telephone Encounter (Signed)
[facility] following [facility]  Thanks CC   triamterene-hydrochlorothiazide (MAXZIDE-25) 37.5-25 MG tablet  Take 1 tablet by mouth everyday.

## 2016-07-21 NOTE — Telephone Encounter (Signed)
LOV 04/22/2016. Pt taking medication 1/2 tablet daily. Allene DillonEmily Drozdowski, CMA

## 2016-08-03 ENCOUNTER — Other Ambulatory Visit: Payer: Self-pay | Admitting: Physician Assistant

## 2016-08-03 MED ORDER — NORTRIPTYLINE HCL 50 MG PO CAPS: 50.0000 mg | ORAL_CAPSULE | Freq: Every day | ORAL | 3 refills | Status: DC

## 2016-08-03 NOTE — Telephone Encounter (Signed)
[facility] on the following medications:  nortriptyline (PAMELOR) 50 MG capsule.  Take 1 tablet by mouth every day.  90 day supply.  [facility]

## 2016-09-14 ENCOUNTER — Other Ambulatory Visit: Payer: Self-pay | Admitting: Physician Assistant

## 2016-09-14 DIAGNOSIS — N959 Unspecified menopausal and perimenopausal disorder: Secondary | ICD-10-CM

## 2016-09-21 DIAGNOSIS — H353131 Nonexudative age-related macular degeneration, bilateral, early dry stage: Secondary | ICD-10-CM | POA: Diagnosis not present

## 2016-10-11 ENCOUNTER — Other Ambulatory Visit: Payer: Self-pay | Admitting: Family Medicine

## 2016-10-13 ENCOUNTER — Other Ambulatory Visit: Payer: Self-pay | Admitting: Physician Assistant

## 2016-10-13 DIAGNOSIS — G8929 Other chronic pain: Secondary | ICD-10-CM

## 2016-10-13 DIAGNOSIS — M5442 Lumbago with sciatica, left side: Principal | ICD-10-CM

## 2016-10-15 DIAGNOSIS — M5416 Radiculopathy, lumbar region: Secondary | ICD-10-CM | POA: Diagnosis not present

## 2016-10-15 DIAGNOSIS — M5136 Other intervertebral disc degeneration, lumbar region: Secondary | ICD-10-CM | POA: Diagnosis not present

## 2016-10-15 DIAGNOSIS — M48062 Spinal stenosis, lumbar region with neurogenic claudication: Secondary | ICD-10-CM | POA: Diagnosis not present

## 2016-11-11 DIAGNOSIS — N83202 Unspecified ovarian cyst, left side: Secondary | ICD-10-CM | POA: Diagnosis not present

## 2016-11-11 DIAGNOSIS — N83292 Other ovarian cyst, left side: Secondary | ICD-10-CM | POA: Diagnosis not present

## 2016-12-03 ENCOUNTER — Ambulatory Visit (INDEPENDENT_AMBULATORY_CARE_PROVIDER_SITE_OTHER): Payer: PPO | Admitting: Physician Assistant

## 2016-12-03 ENCOUNTER — Ambulatory Visit: Payer: Self-pay

## 2016-12-03 DIAGNOSIS — Z23 Encounter for immunization: Secondary | ICD-10-CM | POA: Diagnosis not present

## 2016-12-03 NOTE — Progress Notes (Signed)
Flu vaccine given today without complication. Patient sat upright for 15 minutes to check for adverse reaction before being released. °

## 2016-12-12 IMAGING — MG MM DIGITAL SCREENING BILAT W/ TOMO W/ CAD
9 of 12 series · 9 of 28 positions shown · non-contrast
Comparison: Previous exam(s).

CLINICAL DATA: Screening.

EXAM:
DIGITAL SCREENING BILATERAL MAMMOGRAM WITH 3D TOMO WITH CAD

[L MLO]
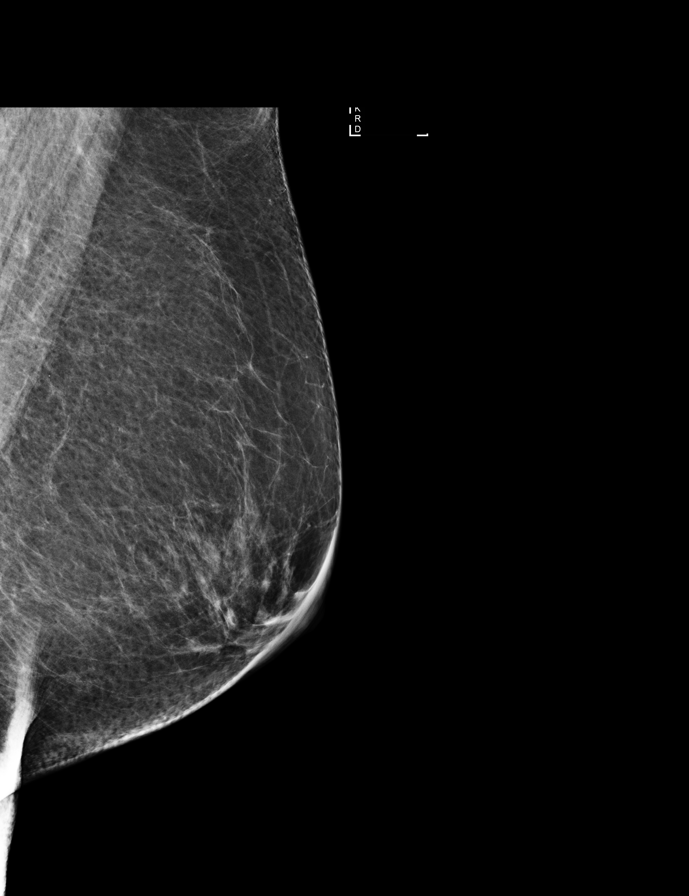

[R CC synth-2D]
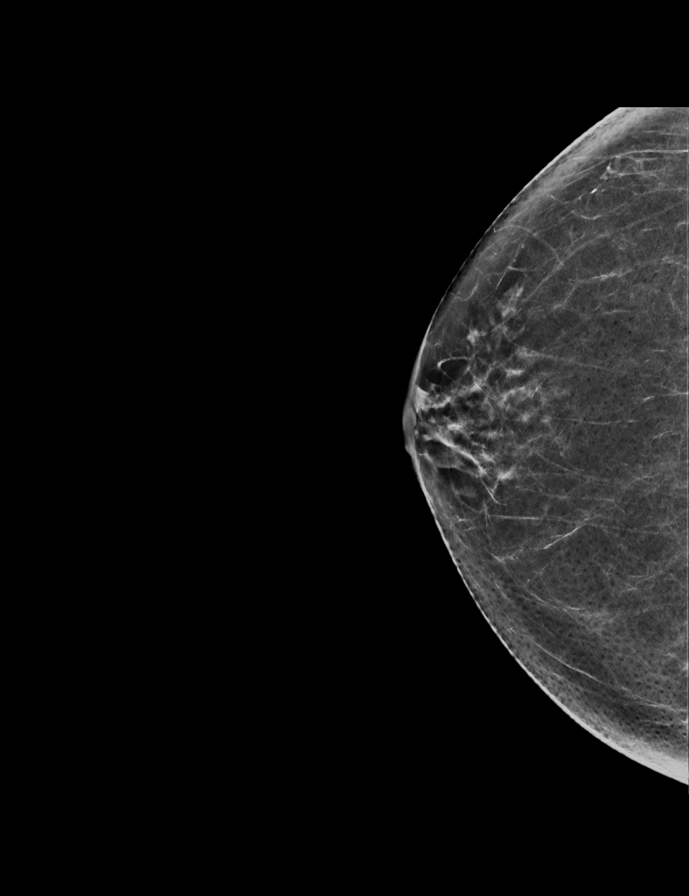

[L CC synth-2D]
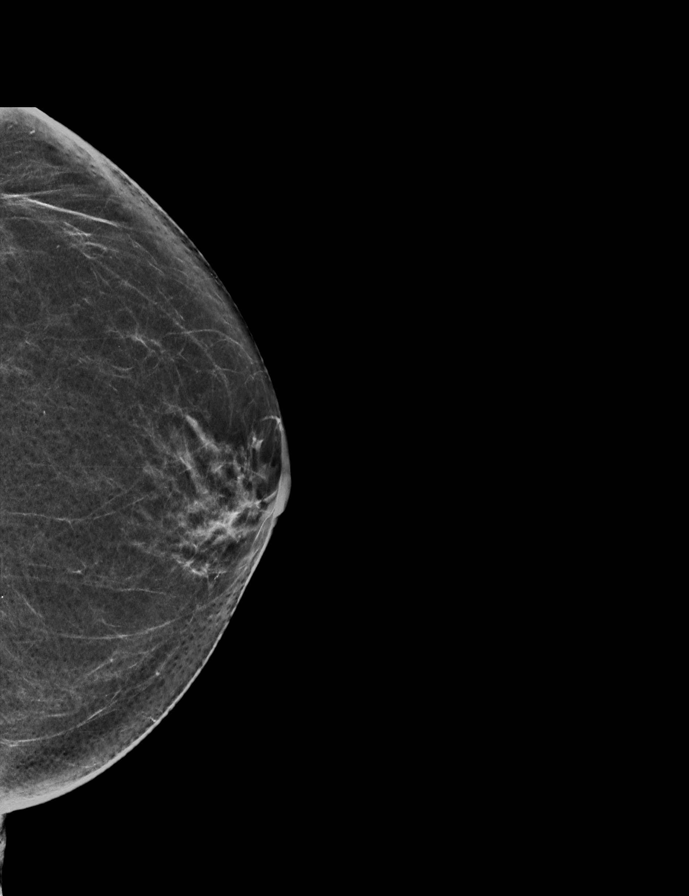

[L MLO synth-2D]
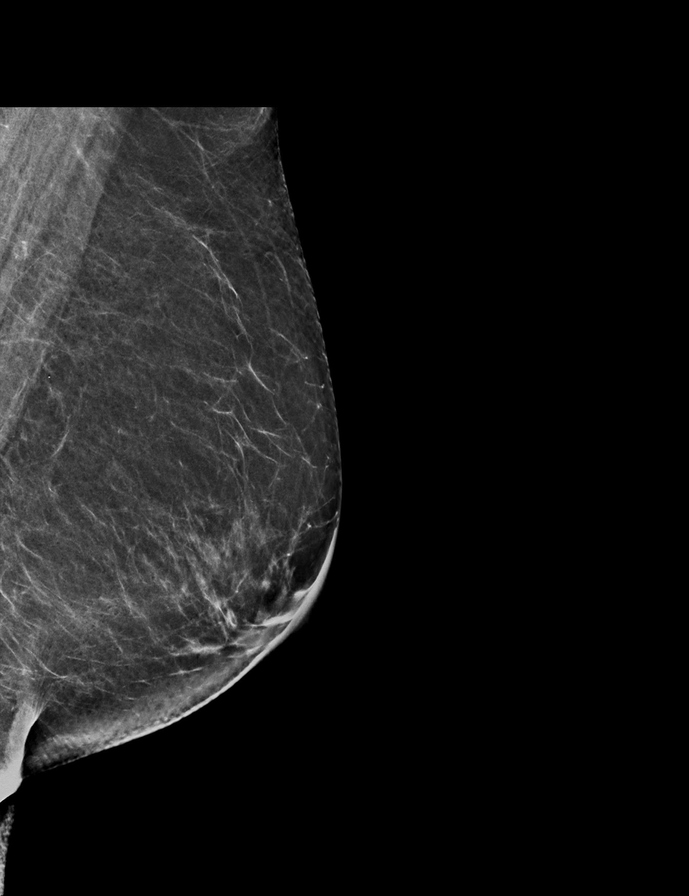

[R MLO synth-2D]
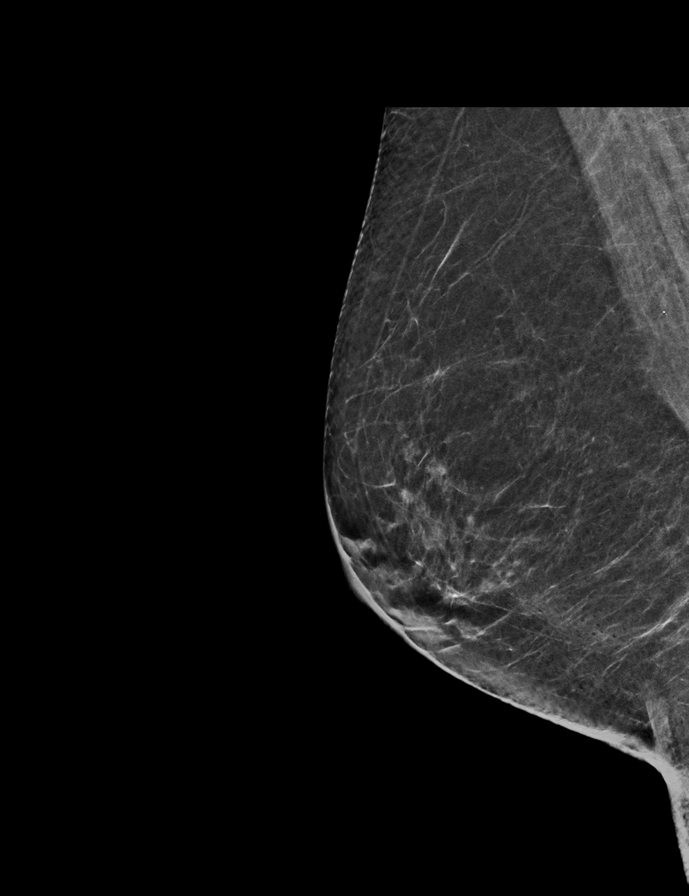

[L CC]
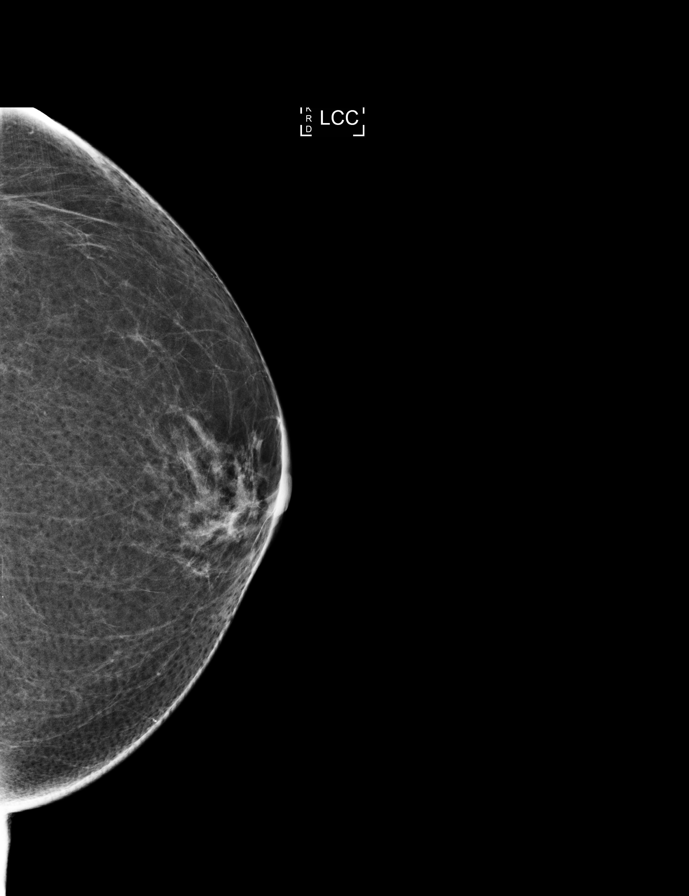

[R MLO]
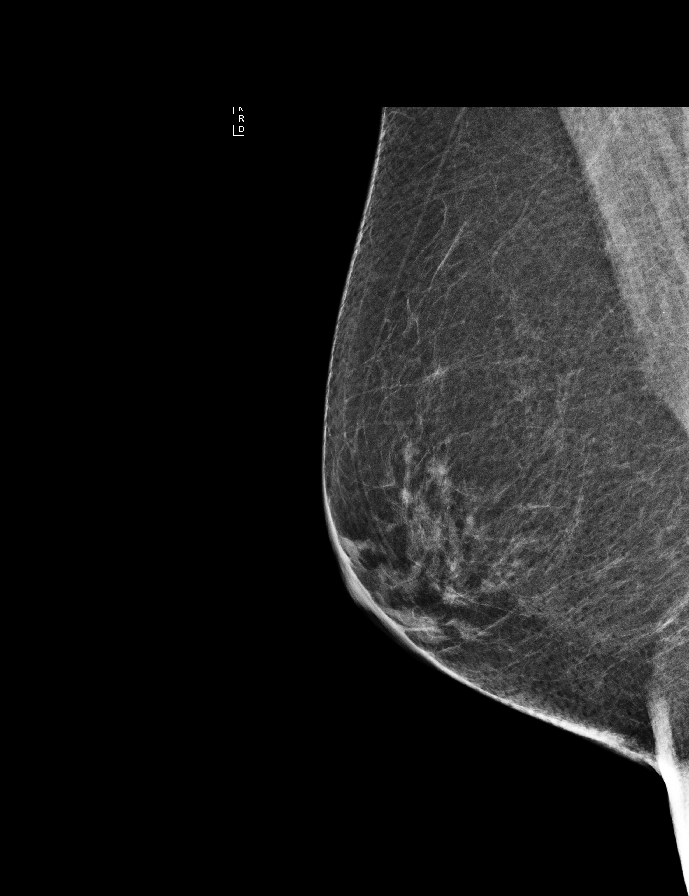

[R CC]
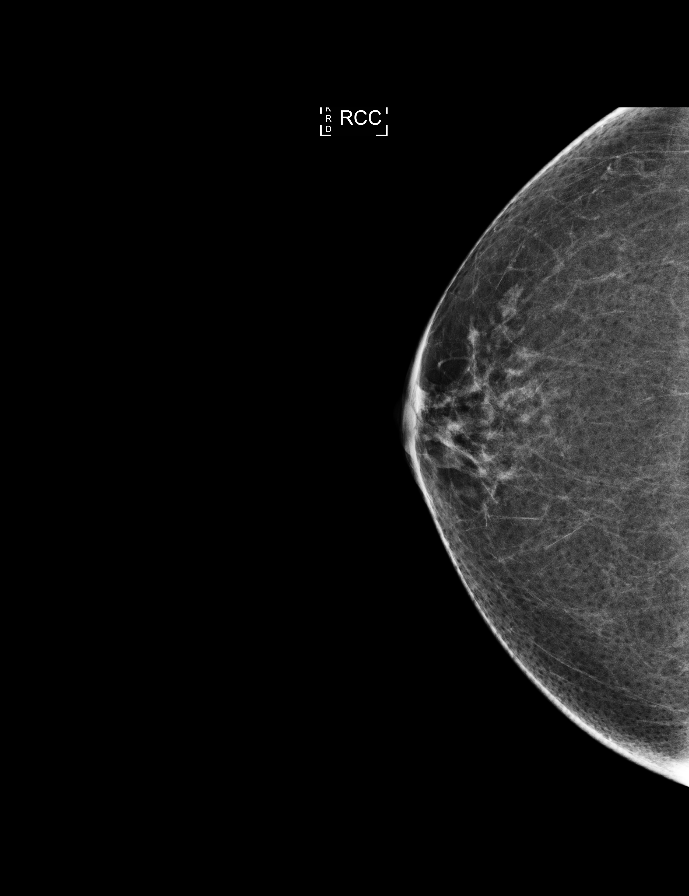

[R MLO tomo · tomo slice 31/60.0]
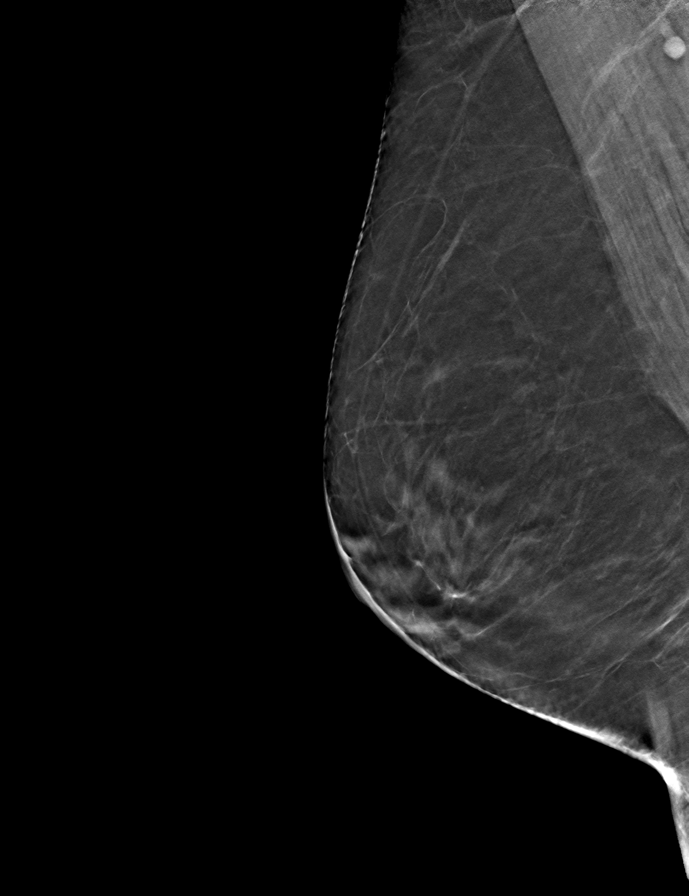

[9 of 28 positions shown; findings below may reference images not displayed]

ACR Breast Density Category b: There are scattered areas of
fibroglandular density.
FINDINGS: There are no findings suspicious for malignancy. Images were
processed with CAD.
IMPRESSION: No mammographic evidence of malignancy. A result letter of this
screening mammogram will be mailed directly to the patient.

RECOMMENDATION:
Screening mammogram in one year. (Code:55-L-23V)

BI-RADS CATEGORY  1: Negative.

## 2017-01-13 ENCOUNTER — Ambulatory Visit (INDEPENDENT_AMBULATORY_CARE_PROVIDER_SITE_OTHER): Payer: PPO

## 2017-01-13 VITALS — BP 128/62 | HR 76 | Temp 98.0°F | Ht 66.0 in | Wt 142.6 lb

## 2017-01-13 DIAGNOSIS — Z0000 Encounter for general adult medical examination without abnormal findings: Secondary | ICD-10-CM

## 2017-01-13 NOTE — Progress Notes (Signed)
Subjective:   Jamie [name] is a 76 y.o. female who presents for Medicare Annual (Subsequent) preventive examination.  Review of Systems:  N/A  Cardiac Risk Factors include: advanced age (>7355men, 45>65 women);dyslipidemia;hypertension     Objective:     Vitals: BP 128/62 (BP Location: Left Arm)   Pulse 76   Temp 98 F (36.7 C) (Oral)   Ht 5\' 6"  (1.676 m)   Wt 142 lb 9.6 oz (64.7 kg)   BMI 23.02 kg/m   Body mass index is 23.02 kg/m.   Tobacco Social History   Tobacco Use  Smoking Status Never Smoker  Smokeless Tobacco Never Used     Counseling given: Not Answered   Past Medical History:  Diagnosis Date  . Anemia   . Anxiety   . Arthritis   . GERD (gastroesophageal reflux disease)   . Headache   . Hypertension   . Hypothyroidism    Past Surgical History:  Procedure Laterality Date  . ABDOMINAL HYSTERECTOMY  1980  . APPENDECTOMY    . CATARACT EXTRACTION Bilateral 2014  . CHOLECYSTECTOMY    . COLONOSCOPY WITH PROPOFOL N/A 03/22/2015   Procedure: COLONOSCOPY WITH PROPOFOL;  Surgeon: Scot Junobert T Elliott, MD;  [facility] END[facility]COPY;  Service: Endoscopy;  Laterality: N/A;  . TUBAL LIGATION     Family History  Problem Relation Age of Onset  . Hyperlipidemia Mother   . Heart attack Father   . Emphysema Father   . Healthy Sister   . Diabetes Brother   . Lymphoma Brother   . Stroke Maternal Grandmother   . Breast cancer Neg Hx    Social History   Substance and Sexual Activity  Sexual Activity Not on file    Outpatient Encounter Medications as of 01/13/2017  Medication Sig  . ALPRAZolam (XANAX) 0.25 MG tablet TAKE 1/2 TABLET (0.125MG ) BY MOUTH EVERY DAY (Patient taking differently: TAKE 1/2 TABLET (0.125MG ) BY MOUTH EVERY DAY as needed)  . Calcium Carbonate (CALCIUM 600 PO) Take 1 tablet by mouth daily.   Marland Kitchen. esomeprazole (NEXIUM) 40 MG capsule Take 40 mg by mouth daily at 12 noon.   Marland Kitchen. estradiol (ESTRACE) 0.5 MG tablet TAKE 1 TABLET (0.5 MG TOTAL) BY  MOUTH DAILY.  . MULTIPLE VITAMIN PO Take 1 tablet by mouth daily.   . nortriptyline (PAMELOR) 50 MG capsule Take 1 capsule (50 mg total) by mouth daily.  Marland Kitchen. SYNTHROID 125 MCG tablet TAKE 1 TABLET BY MOUTH EVERY DAY  . tiZANidine (ZANAFLEX) 4 MG tablet Take 0.5-1 tablets (2-4 mg total) by mouth every 6 (six) hours as needed for muscle spasms.  . traMADol (ULTRAM) 50 MG tablet TAKE 1 TABLET BY MOUTH EVERY EVENING AS NEEDED  . triamterene-hydrochlorothiazide (MAXZIDE-25) 37.5-25 MG tablet TAKE 1/2 TABLET BY MOUTH EVERYDAY AS NEEDED  . Vitamin D, Cholecalciferol, 1000 UNITS CAPS Take 1 capsule by mouth daily.   . [DISCONTINUED] meloxicam (MOBIC) 7.5 MG tablet TAKE 1 TABLET (7.5 MG TOTAL) BY MOUTH AS NEEDED. (Patient not taking: Reported on 01/13/2017)   No facility-administered encounter medications on file as of 01/13/2017.     Activities of Daily Living In your present state of health, do you have any difficulty performing the following activities: 01/13/2017  Hearing? N  Vision? N  Difficulty concentrating or making decisions? N  Walking or climbing stairs? N  Dressing or bathing? N  Doing errands, shopping? N  Preparing Food and eating ? N  Using the Toilet? N  In the past six months,  have you accidently leaked urine? N  Do you have problems with loss of bowel control? N  Managing your Medications? N  Managing your Finances? N  Housekeeping or managing your Housekeeping? N  Some recent data might be hidden    Patient Care Team: Margaretann LovelessBurnette, Jennifer M, PA-C as PCP - General (Family Medicine)    Assessment:     Exercise Activities and Dietary recommendations Current Exercise Habits: Home exercise routine(at the mall), Type of exercise: walking, Time (Minutes): 30, Frequency (Times/Week): 5, Weekly Exercise (Minutes/Week): 150, Intensity: Mild, Exercise limited by: None identified  Goals    . DIET - INCREASE WATER INTAKE     Recommend increasing water intake to 6-8 glasses a day.         Fall Risk Fall Risk  01/13/2017 01/13/2016 12/25/2014  Falls in the past year? No No No   Depression Screen PHQ 2/9 Scores 01/13/2017 01/13/2016 12/25/2014  PHQ - 2 Score 0 0 0     Cognitive Function: Pt declined screening today.        Immunization History  Administered Date(s) Administered  . Influenza, High Dose Seasonal PF 01/03/2015, 12/12/2015, 12/03/2016  . Influenza-Unspecified 10/24/2013  . Pneumococcal Conjugate-13 01/10/2014  . Pneumococcal Polysaccharide-23 05/15/2010   Screening Tests Health Maintenance  Topic Date Due  . TETANUS/TDAP  01/17/1961  . MAMMOGRAM  03/06/2018  . COLONOSCOPY  03/21/2025  . INFLUENZA VACCINE  Completed  . DEXA SCAN  Completed  . PNA vac Low Risk Adult  Completed      Plan:  I have personally reviewed and addressed the Medicare Annual Wellness questionnaire and have noted the following in the patient's chart:  A. Medical and social history B. Use of alcohol, tobacco or illicit drugs  C. Current medications and supplements D. Functional ability and status E.  Nutritional status F.  Physical activity G. Advance directives H. List of other physicians I.  Hospitalizations, surgeries, and ER visits in previous 12 months J.  Vitals K. Screenings such as hearing and vision if needed, cognitive and depression L. Referrals and appointments - none  In addition, I have reviewed and discussed with patient certain preventive protocols, quality metrics, and best practice recommendations. A written personalized care plan for preventive services as well as general preventive health recommendations were provided to patient.  See attached scanned questionnaire for additional information.   Signed,  Hyacinth MeekerMckenzie Kyllie Pettijohn, LPN Nurse Health Advisor   MD Recommendations: None. Pt declined the tetanus vaccine today.

## 2017-01-13 NOTE — Patient Instructions (Signed)
[name] , Thank you for taking time to come for your Medicare Wellness Visit. I appreciate your ongoing commitment to your health goals. Please review the following plan we discussed and let me know if I can assist you in the future.   Screening recommendations/referrals: Colonoscopy: Up to date Mammogram: Up to date Bone Density: Up to date Recommended yearly ophthalmology/optometry visit for glaucoma screening and checkup Recommended yearly dental visit for hygiene and checkup  Vaccinations: Influenza vaccine: completed Pneumococcal vaccine: completed series Tdap vaccine: declined Shingles vaccine: declined  Advanced directives: Please bring a copy of your POA (Power of Attorney) and/or Living Will to your next appointment.   Conditions/risks identified: Recommend increasing water intake to 6-8 glasses a day.   Next appointment: 01/25/17   Preventive Care 65 Years and Older, Female Preventive care refers to lifestyle choices and visits with your health care provider that can promote health and wellness. What does preventive care include?  A yearly physical exam. This is also called an annual well check.  Dental exams once or twice a year.  Routine eye exams. Ask your health care provider how often you should have your eyes checked.  Personal lifestyle choices, including:  Daily care of your teeth and gums.  Regular physical activity.  Eating a healthy diet.  Avoiding tobacco and drug use.  Limiting alcohol use.  Practicing safe sex.  Taking low-dose aspirin every day.  Taking vitamin and mineral supplements as recommended by your health care provider. What happens during an annual well check? The services and screenings done by your health care provider during your annual well check will depend on your age, overall health, lifestyle risk factors, and family history of disease. Counseling  Your health care provider may ask you questions about your:  Alcohol  use.  Tobacco use.  Drug use.  Emotional well-being.  Home and relationship well-being.  Sexual activity.  Eating habits.  History of falls.  Memory and ability to understand (cognition).  Work and work Astronomerenvironment.  Reproductive health. Screening  You may have the following tests or measurements:  Height, weight, and BMI.  Blood pressure.  Lipid and cholesterol levels. These may be checked every 5 years, or more frequently if you are over 75 years old.  Skin check.  Lung cancer screening. You may have this screening every year starting at age 75 if you have a 30-pack-year history of smoking and currently smoke or have quit within the past 15 years.  Fecal occult blood test (FOBT) of the stool. You may have this test every year starting at age 75.  Flexible sigmoidoscopy or colonoscopy. You may have a sigmoidoscopy every 5 years or a colonoscopy every 10 years starting at age 75.  Hepatitis C blood test.  Hepatitis B blood test.  Sexually transmitted disease (STD) testing.  Diabetes screening. This is done by checking your blood sugar (glucose) after you have not eaten for a while (fasting). You may have this done every 1-3 years.  Bone density scan. This is done to screen for osteoporosis. You may have this done starting at age 465.  Mammogram. This may be done every 1-2 years. Talk to your health care provider about how often you should have regular mammograms. Talk with your health care provider about your test results, treatment options, and if necessary, the need for more tests. Vaccines  Your health care provider may recommend certain vaccines, such as:  Influenza vaccine. This is recommended every year.  Tetanus, diphtheria, and  acellular pertussis (Tdap, Td) vaccine. You may need a Td booster every 10 years.  Zoster vaccine. You may need this after age 6.  Pneumococcal 13-valent conjugate (PCV13) vaccine. One dose is recommended after age  26.  Pneumococcal polysaccharide (PPSV23) vaccine. One dose is recommended after age 52. Talk to your health care provider about which screenings and vaccines you need and how often you need them. This information is not intended to replace advice given to you by your health care provider. Make sure you discuss any questions you have with your health care provider. Document Released: 03/08/2015 Document Revised: 10/30/2015 Document Reviewed: 12/11/2014 Elsevier Interactive Patient Education  2017 [place] Prevention in the Home Falls can cause injuries. They can happen to people of all ages. There are many things you can do to make your home safe and to help prevent falls. What can I do on the outside of my home?  Regularly fix the edges of walkways and driveways and fix any cracks.  Remove anything that might make you trip as you walk through a door, such as a raised step or threshold.  Trim any bushes or trees on the path to your home.  Use bright outdoor lighting.  Clear any walking paths of anything that might make someone trip, such as rocks or tools.  Regularly check to see if handrails are loose or broken. Make sure that both sides of any steps have handrails.  Any raised decks and porches should have guardrails on the edges.  Have any leaves, snow, or ice cleared regularly.  Use sand or salt on walking paths during winter.  Clean up any spills in your garage right away. This includes oil or grease spills. What can I do in the bathroom?  Use night lights.  Install grab bars by the toilet and in the tub and shower. Do not use towel bars as grab bars.  Use non-skid mats or decals in the tub or shower.  If you need to sit down in the shower, use a plastic, non-slip stool.  Keep the floor dry. Clean up any water that spills on the floor as soon as it happens.  Remove soap buildup in the tub or shower regularly.  Attach bath mats securely with double-sided  non-slip rug tape.  Do not have throw rugs and other things on the floor that can make you trip. What can I do in the bedroom?  Use night lights.  Make sure that you have a light by your bed that is easy to reach.  Do not use any sheets or blankets that are too big for your bed. They should not hang down onto the floor.  Have a firm chair that has side arms. You can use this for support while you get dressed.  Do not have throw rugs and other things on the floor that can make you trip. What can I do in the kitchen?  Clean up any spills right away.  Avoid walking on wet floors.  Keep items that you use a lot in easy-to-reach places.  If you need to reach something above you, use a strong step stool that has a grab bar.  Keep electrical cords out of the way.  Do not use floor polish or wax that makes floors slippery. If you must use wax, use non-skid floor wax.  Do not have throw rugs and other things on the floor that can make you trip. What can I do with my stairs?  Do not leave any items on the stairs.  Make sure that there are handrails on both sides of the stairs and use them. Fix handrails that are broken or loose. Make sure that handrails are as long as the stairways.  Check any carpeting to make sure that it is firmly attached to the stairs. Fix any carpet that is loose or worn.  Avoid having throw rugs at the top or bottom of the stairs. If you do have throw rugs, attach them to the floor with carpet tape.  Make sure that you have a light switch at the top of the stairs and the bottom of the stairs. If you do not have them, ask someone to add them for you. What else can I do to help prevent falls?  Wear shoes that:  Do not have high heels.  Have rubber bottoms.  Are comfortable and fit you well.  Are closed at the toe. Do not wear sandals.  If you use a stepladder:  Make sure that it is fully opened. Do not climb a closed stepladder.  Make sure that both  sides of the stepladder are locked into place.  Ask someone to hold it for you, if possible.  Clearly mark and make sure that you can see:  Any grab bars or handrails.  First and last steps.  Where the edge of each step is.  Use tools that help you move around (mobility aids) if they are needed. These include:  Canes.  Walkers.  Scooters.  Crutches.  Turn on the lights when you go into a dark area. Replace any light bulbs as soon as they burn out.  Set up your furniture so you have a clear path. Avoid moving your furniture around.  If any of your floors are uneven, fix them.  If there are any pets around you, be aware of where they are.  Review your medicines with your doctor. Some medicines can make you feel dizzy. This can increase your chance of falling. Ask your doctor what other things that you can do to help prevent falls. This information is not intended to replace advice given to you by your health care provider. Make sure you discuss any questions you have with your health care provider. Document Released: 12/06/2008 Document Revised: 07/18/2015 Document Reviewed: 03/16/2014 Elsevier Interactive Patient Education  2017 [employer].

## 2017-01-25 ENCOUNTER — Ambulatory Visit (INDEPENDENT_AMBULATORY_CARE_PROVIDER_SITE_OTHER): Payer: PPO | Admitting: Physician Assistant

## 2017-01-25 ENCOUNTER — Encounter: Payer: Self-pay | Admitting: Physician Assistant

## 2017-01-25 VITALS — BP 140/70 | HR 84 | Temp 98.2°F | Resp 16 | Ht 66.0 in | Wt 142.0 lb

## 2017-01-25 DIAGNOSIS — Z1231 Encounter for screening mammogram for malignant neoplasm of breast: Secondary | ICD-10-CM

## 2017-01-25 DIAGNOSIS — H6121 Impacted cerumen, right ear: Secondary | ICD-10-CM

## 2017-01-25 DIAGNOSIS — Z780 Asymptomatic menopausal state: Secondary | ICD-10-CM

## 2017-01-25 DIAGNOSIS — E7800 Pure hypercholesterolemia, unspecified: Secondary | ICD-10-CM

## 2017-01-25 DIAGNOSIS — R011 Cardiac murmur, unspecified: Secondary | ICD-10-CM | POA: Diagnosis not present

## 2017-01-25 DIAGNOSIS — Z1239 Encounter for other screening for malignant neoplasm of breast: Secondary | ICD-10-CM

## 2017-01-25 DIAGNOSIS — I10 Essential (primary) hypertension: Secondary | ICD-10-CM

## 2017-01-25 DIAGNOSIS — E039 Hypothyroidism, unspecified: Secondary | ICD-10-CM

## 2017-01-25 DIAGNOSIS — M8589 Other specified disorders of bone density and structure, multiple sites: Secondary | ICD-10-CM | POA: Diagnosis not present

## 2017-01-25 DIAGNOSIS — Z0000 Encounter for general adult medical examination without abnormal findings: Secondary | ICD-10-CM

## 2017-01-25 NOTE — Patient Instructions (Addendum)
Vit D 800 IU daily and Calcium 1200mg  daily  Earwax Buildup, Adult The ears produce a substance called earwax that helps keep bacteria out of the ear and protects the skin in the ear canal. Occasionally, earwax can build up in the ear and cause discomfort or hearing loss. What increases the risk? This condition is more likely to develop in people who:  Are female.  Are elderly.  Naturally produce more earwax.  Clean their ears often with cotton swabs.  Use earplugs often.  Use in-ear headphones often.  Wear hearing aids.  Have narrow ear canals.  Have earwax that is overly thick or sticky.  Have eczema.  Are dehydrated.  Have excess hair in the ear canal.  What are the signs or symptoms? Symptoms of this condition include:  Reduced or muffled hearing.  A feeling of fullness in the ear or feeling that the ear is plugged.  Fluid coming from the ear.  Ear pain.  Ear itch.  Ringing in the ear.  Coughing.  An obvious piece of earwax that can be seen inside the ear canal.  How is this diagnosed? This condition may be diagnosed based on:  Your symptoms.  Your medical history.  An ear exam. During the exam, your health care provider will look into your ear with an instrument called an otoscope.  You may have tests, including a hearing test. How is this treated? This condition may be treated by:  Using ear drops to soften the earwax.  Having the earwax removed by a health care provider. The health care provider may: ? Flush the ear with water. ? Use an instrument that has a loop on the end (curette). ? Use a suction device.  Surgery to remove the wax buildup. This may be done in severe cases.  Follow these instructions at home:  Take over-the-counter and prescription medicines only as told by your health care provider.  Do not put any objects, including cotton swabs, into your ear. You can clean the opening of your ear canal with a washcloth or facial  tissue.  Follow instructions from your health care provider about cleaning your ears. Do not over-clean your ears.  Drink enough fluid to keep your urine clear or pale yellow. This will help to thin the earwax.  Keep all follow-up visits as told by your health care provider. If earwax builds up in your ears often or if you use hearing aids, consider seeing your health care provider for routine, preventive ear cleanings. Ask your health care provider how often you should schedule your cleanings.  If you have hearing aids, clean them according to instructions from the manufacturer and your health care provider. Contact a health care provider if:  You have ear pain.  You develop a fever.  You have blood, pus, or other fluid coming from your ear.  You have hearing loss.  You have ringing in your ears that does not go away.  Your symptoms do not improve with treatment.  You feel like the room is spinning (vertigo). Summary  Earwax can build up in the ear and cause discomfort or hearing loss.  The most common symptoms of this condition include reduced or muffled hearing and a feeling of fullness in the ear or feeling that the ear is plugged.  This condition may be diagnosed based on your symptoms, your medical history, and an ear exam.  This condition may be treated by using ear drops to soften the earwax or by having  the earwax removed by a health care provider.  Do not put any objects, including cotton swabs, into your ear. You can clean the opening of your ear canal with a washcloth or facial tissue. This information is not intended to replace advice given to you by your health care provider. Make sure you discuss any questions you have with your health care provider. Document Released: 03/19/2004 Document Revised: 04/22/2016 Document Reviewed: 04/22/2016 Elsevier Interactive Patient Education  [employer].

## 2017-01-25 NOTE — Progress Notes (Signed)
Patient: Jamie [name], Female    DOB: 1940-05-29, 76 y.o.   MRN: [id number] Visit Date: 01/25/2017  Today's Provider: Margaretann LovelessJennifer M Binta Statzer, PA-C   Chief Complaint  Patient presents with  . Annual Exam   Subjective:    Annual physical exam Jamie [name] is a 76 y.o. female who presents today for health maintenance and complete physical. She feels well. She reports exercising. She reports she is sleeping well.  AWV with [facility]- 01/13/17 ----------------------------------------------------------------- Patient declined Tetanus vaccine on 01/13/2017  Review of Systems  Constitutional: Negative.   HENT: Negative.   Eyes: Negative.   Respiratory: Negative.   Cardiovascular: Negative.   Gastrointestinal: Negative.   Endocrine: Positive for cold intolerance.  Genitourinary: Negative.   Musculoskeletal: Positive for arthralgias, back pain and gait problem.  Skin: Negative.   Allergic/Immunologic: Negative.   Neurological: Negative for dizziness, weakness, light-headedness, numbness and headaches.  Hematological: Negative.   Psychiatric/Behavioral: Negative.     Social History      She  reports that  has never smoked. she has never used smokeless tobacco. She reports that she does not drink alcohol or use drugs.       Social History   Socioeconomic History  . Marital status: Married    Spouse name: None  . Number of children: None  . Years of education: None  . Highest education level: None  Social Needs  . Financial resource strain: None  . Food insecurity - worry: None  . Food insecurity - inability: None  . Transportation needs - medical: None  . Transportation needs - non-medical: None  Occupational History  . None  Tobacco Use  . Smoking status: Never Smoker  . Smokeless tobacco: Never Used  Substance and Sexual Activity  . Alcohol use: No  . Drug use: No  . Sexual activity: None  Other Topics Concern  . None  Social History Narrative  . None     Past Medical History:  Diagnosis Date  . Anemia   . Anxiety   . Arthritis   . GERD (gastroesophageal reflux disease)   . Headache   . Hypertension   . Hypothyroidism      Patient Active Problem List   Diagnosis Date Noted  . Cyst of ovary 05/03/2015  . Foot pain 11/22/2014  . Dysfunction of eustachian tube 11/20/2014  . Fecal occult blood test positive 11/20/2014  . Hypercholesteremia 11/20/2014  . Cardiac murmur 11/20/2014  . Temporary cerebral vascular dysfunction 11/20/2014  . Gout attack 11/20/2014  . Swelling of left lower extremity 11/20/2014  . Shortness of breath 11/20/2014  . Brain disorder 11/20/2014  . Essential (primary) hypertension 11/20/2014  . L-S radiculopathy 09/27/2014  . Migraine 07/31/2014  . Hypothyroidism 07/31/2014  . Colon polyp 12/24/2009  . LBP (low back pain) 06/28/2009  . Anxiety state 10/26/2008  . Esophageal stenosis 10/26/2008  . Hemorrhoids, internal 10/26/2008  . Menopausal and perimenopausal disorder 10/26/2008  . Gastro-esophageal reflux disease without esophagitis 10/26/2008  . Edema 10/26/2008  . Allergic rhinitis 07/13/2006    Past Surgical History:  Procedure Laterality Date  . ABDOMINAL HYSTERECTOMY  1980  . APPENDECTOMY    . CATARACT EXTRACTION Bilateral 2014  . CHOLECYSTECTOMY    . COLONOSCOPY WITH PROPOFOL N/A 03/22/2015   Procedure: COLONOSCOPY WITH PROPOFOL;  Surgeon: Scot Junobert T Elliott, MD;  [facility];  Service: Endoscopy;  Laterality: N/A;  . TUBAL LIGATION      Family History  Family Status  Relation Name Status  . Mother  Deceased at age 75       old age  . Father  Deceased at age 75       MI  . Sister  Alive  . Brother  Alive  . MGM  (Not Specified)  . Neg Hx  (Not Specified)        Her family history includes Diabetes in her brother; Emphysema in her father; Healthy in her sister; Heart attack in her father; Hyperlipidemia in her mother; Lymphoma in her brother; Stroke in her  maternal grandmother.     Allergies  Allergen Reactions  . Codeine   . Penicillins Rash     Current Outpatient Medications:  .  ALPRAZolam (XANAX) 0.25 MG tablet, TAKE 1/2 TABLET (0.125MG ) BY MOUTH EVERY DAY (Patient taking differently: TAKE 1/2 TABLET (0.125MG ) BY MOUTH EVERY DAY as needed), Disp: 45 tablet, Rfl: 4 .  Calcium Carbonate (CALCIUM 600 PO), Take 1 tablet by mouth daily. , Disp: , Rfl:  .  esomeprazole (NEXIUM) 40 MG capsule, Take 40 mg by mouth daily at 12 noon. , Disp: , Rfl:  .  estradiol (ESTRACE) 0.5 MG tablet, TAKE 1 TABLET (0.5 MG TOTAL) BY MOUTH DAILY., Disp: 90 tablet, Rfl: 1 .  MULTIPLE VITAMIN PO, Take 1 tablet by mouth daily. , Disp: , Rfl:  .  nortriptyline (PAMELOR) 50 MG capsule, Take 1 capsule (50 mg total) by mouth daily., Disp: 90 capsule, Rfl: 3 .  SYNTHROID 125 MCG tablet, TAKE 1 TABLET BY MOUTH EVERY DAY, Disp: 90 tablet, Rfl: 1 .  tiZANidine (ZANAFLEX) 4 MG tablet, Take 0.5-1 tablets (2-4 mg total) by mouth every 6 (six) hours as needed for muscle spasms., Disp: 90 tablet, Rfl: 1 .  traMADol (ULTRAM) 50 MG tablet, TAKE 1 TABLET BY MOUTH EVERY EVENING AS NEEDED, Disp: 90 tablet, Rfl: 1 .  triamterene-hydrochlorothiazide (MAXZIDE-25) 37.5-25 MG tablet, TAKE 1/2 TABLET BY MOUTH EVERYDAY AS NEEDED, Disp: 45 tablet, Rfl: 1 .  Vitamin D, Cholecalciferol, 1000 UNITS CAPS, Take 1 capsule by mouth daily. , Disp: , Rfl:    Patient Care Team: Margaretann LovelessBurnette, Cort Dragoo M, PA-C as PCP - General (Family Medicine)      Objective:   Vitals: BP 140/70 (BP Location: Left Arm, Patient Position: Sitting, Cuff Size: Normal)   Pulse 84   Temp 98.2 F (36.8 C) (Oral)   Resp 16   Ht 5\' 6"  (1.676 m)   Wt 142 lb (64.4 kg)   SpO2 98%   BMI 22.92 kg/m     Physical Exam  Constitutional: She is oriented to person, place, and time. She appears well-developed and well-nourished. No distress.  HENT:  Head: Normocephalic and atraumatic.  Right Ear: Hearing, tympanic membrane,  external ear and ear canal normal.  Left Ear: Hearing, tympanic membrane, external ear and ear canal normal.  Nose: Nose normal.  Mouth/Throat: Uvula is midline, oropharynx is clear and moist and mucous membranes are normal. No oropharyngeal exudate.  Eyes: Conjunctivae and EOM are normal. Pupils are equal, round, and reactive to light. Right eye exhibits no discharge. Left eye exhibits no discharge. No scleral icterus.  Neck: Normal range of motion. Neck supple. No JVD present. Carotid bruit is not present. No tracheal deviation present. No thyromegaly present.  Cardiovascular: Normal rate, regular rhythm and intact distal pulses. Exam reveals no gallop and no friction rub.  Murmur (sounds like mitral prolapse) heard. Pulmonary/Chest: Effort normal and breath sounds normal. No respiratory  distress. She has no wheezes. She has no rales. She exhibits no tenderness.  Abdominal: Soft. Bowel sounds are normal. She exhibits no distension and no mass. There is no tenderness. There is no rebound and no guarding.  Musculoskeletal: Normal range of motion. She exhibits no edema or tenderness.  Lymphadenopathy:    She has no cervical adenopathy.  Neurological: She is alert and oriented to person, place, and time. She has normal reflexes.  Skin: Skin is warm and dry. No rash noted. She is not diaphoretic.  Psychiatric: She has a normal mood and affect. Her behavior is normal. Judgment and thought content normal.  Vitals reviewed.    Depression Screen PHQ 2/9 Scores 01/13/2017 01/13/2016 12/25/2014  PHQ - 2 Score 0 0 0      Assessment & Plan:     Routine Health Maintenance and Physical Exam  Exercise Activities and Dietary recommendations Goals    . DIET - INCREASE WATER INTAKE     Recommend increasing water intake to 6-8 glasses a day.        Immunization History  Administered Date(s) Administered  . Influenza, High Dose Seasonal PF 01/03/2015, 12/12/2015, 12/03/2016  .  Influenza-Unspecified 10/24/2013  . Pneumococcal Conjugate-13 01/10/2014  . Pneumococcal Polysaccharide-23 05/15/2010    Health Maintenance  Topic Date Due  . TETANUS/TDAP  01/17/1961  . COLONOSCOPY  03/21/2025  . INFLUENZA VACCINE  Completed  . DEXA SCAN  Completed  . PNA vac Low Risk Adult  Completed     Discussed health benefits of physical activity, and encouraged her to engage in regular exercise appropriate for her age and condition.     1. Annual physical exam Normal physical exam today. Will check labs as below and f/u pending lab results. If labs are stable and WNL she will not need to have these rechecked for one year at her next annual physical exam. She is to call the office in the meantime if she has any acute issue, questions or concerns.  2. Breast cancer screening There is no family history of breast cancer. She does perform regular self breast exams. Mammogram was ordered as below. Information for [facility] was given to patient so she may schedule her mammogram at her convenience. - MM Digital Screening; Future  3. Osteopenia of multiple sites T score -1.6 in 2016. Will order labs as below and repeat BMD. I will f/u pending results.  - CBC with Differential/Platelet - Comprehensive metabolic panel - DG Bone Density; Future  4. Postmenopausal estrogen deficiency See above medical treatment plan. - Comprehensive metabolic panel - DG Bone Density; Future  5. Impacted cerumen of right ear Lavage performed and successful of right ear. - Ear Lavage  6. Essential (primary) hypertension Stable on Maxzide 37.5-25mg  1/2 tab daily. Will check labs as below and f/u pending results. - CBC with Differential/Platelet - Comprehensive metabolic panel - Hemoglobin A1c - Lipid panel  7. Hypothyroidism, unspecified type Stable on Synthroid . Will check labs as below and f/u pending results. - CBC with Differential/Platelet - Comprehensive metabolic  panel - Lipid panel - TSH  8. Cardiac murmur Undiagnosed. Asymptomatic. Sounds like mitral prolapse.  - CBC with Differential/Platelet - Comprehensive metabolic panel  9. Hypercholesterolemia Borderline elevated last year. Will check labs as below and f/u pending results. - Hemoglobin A1c - Lipid panel  --------------------------------------------------------------------    Margaretann Loveless, PA-C  [facility]

## 2017-01-25 NOTE — Progress Notes (Deleted)
Patient: Jamie [name], Female    DOB: [birth year], 76 y.o.   MRN: [id number] Visit Date: 01/25/2017  Today's Provider: Margaretann LovelessJennifer M Burnette, PA-C   No chief complaint on file.  Subjective:    Annual physical exam Jamie [name] is a 76 y.o. female who presents today for health maintenance and complete physical. She feels {DESC; WELL/FAIRLY WELL/POORLY:18703}. She reports exercising ***. She reports she is sleeping {DESC; WELL/FAIRLY WELL/POORLY:18703}.   CPE:01/13/2016 Mammogram:03/06/2016 BI-RADS 1 BMD:01/16/2015 Osteoporosis Colonoscopy:03/22/2015 Diverticulosis -----------------------------------------------------------------   Review of Systems  Constitutional: Negative.   HENT: Negative.   Eyes: Negative.   Respiratory: Negative.   Cardiovascular: Negative.   Gastrointestinal: Negative.   Endocrine: Negative.   Genitourinary: Negative.   Musculoskeletal: Negative.   Skin: Negative.   Allergic/Immunologic: Negative.   Neurological: Negative.   Hematological: Negative.   Psychiatric/Behavioral: Negative.     Social History      She  reports that  has never smoked. she has never used smokeless tobacco. She reports that she does not drink alcohol or use drugs.       Social History   Socioeconomic History  . Marital status: Married    Spouse name: Not on file  . Number of children: Not on file  . Years of education: Not on file  . Highest education level: Not on file  Social Needs  . Financial resource strain: Not on file  . Food insecurity - worry: Not on file  . Food insecurity - inability: Not on file  . Transportation needs - medical: Not on file  . Transportation needs - non-medical: Not on file  Occupational History  . Not on file  Tobacco Use  . Smoking status: Never Smoker  . Smokeless tobacco: Never Used  Substance and Sexual Activity  . Alcohol use: No  . Drug use: No  . Sexual activity: Not on file  Other Topics Concern  . Not on  file  Social History Narrative  . Not on file    Past Medical History:  Diagnosis Date  . Anemia   . Anxiety   . Arthritis   . GERD (gastroesophageal reflux disease)   . Headache   . Hypertension   . Hypothyroidism      Patient Active Problem List   Diagnosis Date Noted  . Cyst of ovary 05/03/2015  . Foot pain 11/22/2014  . Dysfunction of eustachian tube 11/20/2014  . Fecal occult blood test positive 11/20/2014  . Hypercholesteremia 11/20/2014  . Cardiac murmur 11/20/2014  . Temporary cerebral vascular dysfunction 11/20/2014  . Gout attack 11/20/2014  . Swelling of left lower extremity 11/20/2014  . Shortness of breath 11/20/2014  . Brain disorder 11/20/2014  . Essential (primary) hypertension 11/20/2014  . L-S radiculopathy 09/27/2014  . Migraine 07/31/2014  . Hypothyroidism 07/31/2014  . Colon polyp 12/24/2009  . LBP (low back pain) 06/28/2009  . Anxiety state 10/26/2008  . Esophageal stenosis 10/26/2008  . Hemorrhoids, internal 10/26/2008  . Menopausal and perimenopausal disorder 10/26/2008  . Gastro-esophageal reflux disease without esophagitis 10/26/2008  . Edema 10/26/2008  . Allergic rhinitis 07/13/2006    Past Surgical History:  Procedure Laterality Date  . ABDOMINAL HYSTERECTOMY  1980  . APPENDECTOMY    . CATARACT EXTRACTION Bilateral 2014  . CHOLECYSTECTOMY    . COLONOSCOPY WITH PROPOFOL N/A 03/22/2015   Procedure: COLONOSCOPY WITH PROPOFOL;  Surgeon: Scot Junobert T Elliott, MD;  [facility];  Service: Endoscopy;  Laterality: N/A;  . TUBAL  LIGATION      Family History        Family Status  Relation Name Status  . Mother  Deceased at age 75       old age  . Father  Deceased at age 75       MI  . Sister  Alive  . Brother  Alive  . MGM  (Not Specified)  . Neg Hx  (Not Specified)        Her family history includes Diabetes in her brother; Emphysema in her father; Healthy in her sister; Heart attack in her father; Hyperlipidemia in her  mother; Lymphoma in her brother; Stroke in her maternal grandmother.     Allergies  Allergen Reactions  . Codeine   . Penicillins Rash     Current Outpatient Medications:  .  ALPRAZolam (XANAX) 0.25 MG tablet, TAKE 1/2 TABLET (0.125MG ) BY MOUTH EVERY DAY (Patient taking differently: TAKE 1/2 TABLET (0.125MG ) BY MOUTH EVERY DAY as needed), Disp: 45 tablet, Rfl: 4 .  Calcium Carbonate (CALCIUM 600 PO), Take 1 tablet by mouth daily. , Disp: , Rfl:  .  esomeprazole (NEXIUM) 40 MG capsule, Take 40 mg by mouth daily at 12 noon. , Disp: , Rfl:  .  estradiol (ESTRACE) 0.5 MG tablet, TAKE 1 TABLET (0.5 MG TOTAL) BY MOUTH DAILY., Disp: 90 tablet, Rfl: 1 .  MULTIPLE VITAMIN PO, Take 1 tablet by mouth daily. , Disp: , Rfl:  .  nortriptyline (PAMELOR) 50 MG capsule, Take 1 capsule (50 mg total) by mouth daily., Disp: 90 capsule, Rfl: 3 .  SYNTHROID 125 MCG tablet, TAKE 1 TABLET BY MOUTH EVERY DAY, Disp: 90 tablet, Rfl: 1 .  tiZANidine (ZANAFLEX) 4 MG tablet, Take 0.5-1 tablets (2-4 mg total) by mouth every 6 (six) hours as needed for muscle spasms., Disp: 90 tablet, Rfl: 1 .  traMADol (ULTRAM) 50 MG tablet, TAKE 1 TABLET BY MOUTH EVERY EVENING AS NEEDED, Disp: 90 tablet, Rfl: 1 .  triamterene-hydrochlorothiazide (MAXZIDE-25) 37.5-25 MG tablet, TAKE 1/2 TABLET BY MOUTH EVERYDAY AS NEEDED, Disp: 45 tablet, Rfl: 1 .  Vitamin D, Cholecalciferol, 1000 UNITS CAPS, Take 1 capsule by mouth daily. , Disp: , Rfl:    Patient Care Team: Reine JustBurnette, Jennifer M, PA-C as PCP - General (Family Medicine)      Objective:   Vitals: There were no vitals taken for this visit.   Physical Exam  Constitutional: She is oriented to person, place, and time. She appears well-developed and well-nourished.  HENT:  Head: Normocephalic and atraumatic.  Right Ear: External ear normal.  Left Ear: External ear normal.  Nose: Nose normal.  Mouth/Throat: Oropharynx is clear and moist.  Eyes: Conjunctivae and EOM are normal.  Pupils are equal, round, and reactive to light.  Neck: Normal range of motion. Neck supple.  Cardiovascular: Normal rate, regular rhythm, normal heart sounds and intact distal pulses.  Pulmonary/Chest: Effort normal and breath sounds normal.  Abdominal: Soft. Bowel sounds are normal.  Musculoskeletal: Normal range of motion.  Neurological: She is alert and oriented to person, place, and time. She has normal reflexes.  Skin: Skin is warm and dry.  Psychiatric: She has a normal mood and affect. Her behavior is normal. Judgment and thought content normal.     Depression Screen PHQ 2/9 Scores 01/13/2017 01/13/2016 12/25/2014  PHQ - 2 Score 0 0 0      Assessment & Plan:     Routine Health Maintenance and Physical Exam  Exercise Activities and  Dietary recommendations Goals    . DIET - INCREASE WATER INTAKE     Recommend increasing water intake to 6-8 glasses a day.        Immunization History  Administered Date(s) Administered  . Influenza, High Dose Seasonal PF 01/03/2015, 12/12/2015, 12/03/2016  . Influenza-Unspecified 10/24/2013  . Pneumococcal Conjugate-13 01/10/2014  . Pneumococcal Polysaccharide-23 05/15/2010    Health Maintenance  Topic Date Due  . TETANUS/TDAP  01/17/1961  . COLONOSCOPY  03/21/2025  . INFLUENZA VACCINE  Completed  . DEXA SCAN  Completed  . PNA vac Low Risk Adult  Completed     Discussed health benefits of physical activity, and encouraged her to engage in regular exercise appropriate for her age and condition.    --------------------------------------------------------------------    Margaretann Loveless, PA-C  [facility]

## 2017-02-17 ENCOUNTER — Other Ambulatory Visit: Payer: Self-pay | Admitting: Family Medicine

## 2017-02-17 DIAGNOSIS — F411 Generalized anxiety disorder: Secondary | ICD-10-CM

## 2017-02-17 NOTE — Telephone Encounter (Signed)
rx called in-Jamie [name] V Otis Burress, RMA  

## 2017-02-18 DIAGNOSIS — I10 Essential (primary) hypertension: Secondary | ICD-10-CM | POA: Diagnosis not present

## 2017-02-18 DIAGNOSIS — E039 Hypothyroidism, unspecified: Secondary | ICD-10-CM | POA: Diagnosis not present

## 2017-02-18 DIAGNOSIS — M8589 Other specified disorders of bone density and structure, multiple sites: Secondary | ICD-10-CM | POA: Diagnosis not present

## 2017-02-18 DIAGNOSIS — E7800 Pure hypercholesterolemia, unspecified: Secondary | ICD-10-CM | POA: Diagnosis not present

## 2017-02-19 LAB — HEMOGLOBIN A1C
EAG (MMOL/L): 6.3 (calc)
Hgb A1c MFr Bld: 5.6 % of total Hgb (ref <5.7)
MEAN PLASMA GLUCOSE: 114 (calc)

## 2017-02-19 LAB — CBC WITH DIFFERENTIAL/PLATELET
Basophils Absolute: 37 cells/uL (ref 0–200)
Basophils Relative: 0.6 %
EOS PCT: 6.8 %
Eosinophils Absolute: 422 cells/uL (ref 15–500)
HCT: 36 % (ref 35.0–45.0)
Hemoglobin: 11.3 g/dL — ABNORMAL LOW (ref 11.7–15.5)
Lymphs Abs: 1500 cells/uL (ref 850–3900)
MCH: 25 pg — ABNORMAL LOW (ref 27.0–33.0)
MCHC: 31.4 g/dL — ABNORMAL LOW (ref 32.0–36.0)
MCV: 79.6 fL — ABNORMAL LOW (ref 80.0–100.0)
MPV: 9.6 fL (ref 7.5–12.5)
Monocytes Relative: 8.7 %
NEUTROS PCT: 59.7 %
Neutro Abs: 3701 cells/uL (ref 1500–7800)
PLATELETS: 375 10*3/uL (ref 140–400)
RBC: 4.52 10*6/uL (ref 3.80–5.10)
RDW: 14.3 % (ref 11.0–15.0)
TOTAL LYMPHOCYTE: 24.2 %
WBC mixed population: 539 cells/uL (ref 200–950)
WBC: 6.2 10*3/uL (ref 3.8–10.8)

## 2017-02-19 LAB — COMPLETE METABOLIC PANEL WITH GFR
AG RATIO: 2 (calc) (ref 1.0–2.5)
ALBUMIN MSPROF: 4.3 g/dL (ref 3.6–5.1)
ALKALINE PHOSPHATASE (APISO): 59 U/L (ref 33–130)
ALT: 13 U/L (ref 6–29)
AST: 17 U/L (ref 10–35)
BUN: 12 mg/dL (ref 7–25)
CO2: 32 mmol/L (ref 20–32)
CREATININE: 0.85 mg/dL (ref 0.60–0.93)
Calcium: 9.4 mg/dL (ref 8.6–10.4)
Chloride: 104 mmol/L (ref 98–110)
GFR, EST NON AFRICAN AMERICAN: 67 mL/min/{1.73_m2} (ref >=60)
GFR, Est African American: 78 mL/min/{1.73_m2} (ref >=60)
GLOBULIN: 2.2 g/dL (ref 1.9–3.7)
Glucose, Bld: 93 mg/dL (ref 65–99)
POTASSIUM: 4 mmol/L (ref 3.5–5.3)
SODIUM: 140 mmol/L (ref 135–146)
Total Bilirubin: 0.3 mg/dL (ref 0.2–1.2)
Total Protein: 6.5 g/dL (ref 6.1–8.1)

## 2017-02-19 LAB — LIPID PANEL
Cholesterol: 199 mg/dL (ref <200)
HDL: 87 mg/dL (ref >50)
LDL CHOLESTEROL (CALC): 85 mg/dL
Non-HDL Cholesterol (Calc): 112 mg/dL (calc) (ref <130)
Total CHOL/HDL Ratio: 2.3 (calc) (ref <5.0)
Triglycerides: 171 mg/dL — ABNORMAL HIGH (ref <150)

## 2017-02-19 LAB — TSH: TSH: 2.56 m[IU]/L (ref 0.40–4.50)

## 2017-02-25 ENCOUNTER — Telehealth: Payer: Self-pay

## 2017-02-25 NOTE — Telephone Encounter (Signed)
Patient advised as below. Patient verbalizes understanding and is in agreement with treatment plan.  

## 2017-02-25 NOTE — Telephone Encounter (Signed)
-----   Message from Margaretann LovelessJennifer M Burnette, [place]PA-C sent at 02/24/2017 10:04 AM EST ----- Labs show a decreased hemoglobin that could be iron deficiency. I would recommend to add ferrous sulfate 325mg  once daily with food. A1c stable and normal. Cholesterol stable with exception of triglycerides that were elevated most likely due to not a true fasting lab. Thyroid stable and normal. Kidney and liver function normal. I would recommend rechecking CBC in 6 weeks after starting ferrous sulfate.

## 2017-03-08 ENCOUNTER — Other Ambulatory Visit: Payer: Self-pay | Admitting: Physician Assistant

## 2017-03-08 DIAGNOSIS — N959 Unspecified menopausal and perimenopausal disorder: Secondary | ICD-10-CM

## 2017-03-08 NOTE — Telephone Encounter (Signed)
[facility] for the following medications:  estradiol (ESTRACE) 0.5 MG tablet   90 day supply  Last Rx: 09/14/16 LOV: 01/25/17 Please advise. Thanks TNP

## 2017-03-09 MED ORDER — ESTRADIOL 0.5 MG PO TABS: 0.5000 mg | ORAL_TABLET | Freq: Every day | ORAL | 1 refills | Status: DC

## 2017-03-11 ENCOUNTER — Ambulatory Visit
Admission: RE | Admit: 2017-03-11 | Discharge: 2017-03-11 | Disposition: A | Discharge status: Home / Self Care | Payer: PPO | Source: Ambulatory Visit | Attending: Physician Assistant | Admitting: Physician Assistant

## 2017-03-11 DIAGNOSIS — M8589 Other specified disorders of bone density and structure, multiple sites: Secondary | ICD-10-CM | POA: Diagnosis not present

## 2017-03-11 DIAGNOSIS — Z1231 Encounter for screening mammogram for malignant neoplasm of breast: Secondary | ICD-10-CM | POA: Diagnosis not present

## 2017-03-11 DIAGNOSIS — Z1239 Encounter for other screening for malignant neoplasm of breast: Secondary | ICD-10-CM

## 2017-03-11 DIAGNOSIS — M8580 Other specified disorders of bone density and structure, unspecified site: Secondary | ICD-10-CM | POA: Insufficient documentation

## 2017-03-11 DIAGNOSIS — Z780 Asymptomatic menopausal state: Secondary | ICD-10-CM

## 2017-03-11 DIAGNOSIS — Z13820 Encounter for screening for osteoporosis: Secondary | ICD-10-CM | POA: Diagnosis not present

## 2017-03-16 ENCOUNTER — Other Ambulatory Visit: Payer: Self-pay | Admitting: Physician Assistant

## 2017-03-16 DIAGNOSIS — M62838 Other muscle spasm: Secondary | ICD-10-CM

## 2017-04-05 ENCOUNTER — Other Ambulatory Visit: Payer: Self-pay | Admitting: Physician Assistant

## 2017-04-05 MED ORDER — SYNTHROID 125 MCG PO TABS: 125.0000 ug | ORAL_TABLET | Freq: Every day | ORAL | 1 refills | Status: DC

## 2017-04-05 NOTE — Telephone Encounter (Signed)
[facility] for a 90-days supply for the following medication. Thanks CC  SYNTHROID 125 MCG tablet

## 2017-04-07 DIAGNOSIS — G5722 Lesion of femoral nerve, left lower limb: Secondary | ICD-10-CM | POA: Diagnosis not present

## 2017-04-07 DIAGNOSIS — M9903 Segmental and somatic dysfunction of lumbar region: Secondary | ICD-10-CM | POA: Diagnosis not present

## 2017-04-09 ENCOUNTER — Telehealth: Payer: Self-pay | Admitting: Physician Assistant

## 2017-04-09 DIAGNOSIS — M62838 Other muscle spasm: Secondary | ICD-10-CM

## 2017-04-09 MED ORDER — TIZANIDINE HCL 4 MG PO TABS: 2.0000 mg | ORAL_TABLET | Freq: Four times a day (QID) | ORAL | 1 refills | Status: DC | PRN

## 2017-04-09 NOTE — Telephone Encounter (Signed)
[facility] for a 90-days supply for the following medication. Thanks CC ° °tiZANidine (ZANAFLEX) 4 MG tablet  ° °

## 2017-04-13 DIAGNOSIS — G5722 Lesion of femoral nerve, left lower limb: Secondary | ICD-10-CM | POA: Diagnosis not present

## 2017-04-13 DIAGNOSIS — M9903 Segmental and somatic dysfunction of lumbar region: Secondary | ICD-10-CM | POA: Diagnosis not present

## 2017-04-19 DIAGNOSIS — G5722 Lesion of femoral nerve, left lower limb: Secondary | ICD-10-CM | POA: Diagnosis not present

## 2017-04-19 DIAGNOSIS — M9903 Segmental and somatic dysfunction of lumbar region: Secondary | ICD-10-CM | POA: Diagnosis not present

## 2017-04-29 ENCOUNTER — Other Ambulatory Visit: Payer: Self-pay | Admitting: Physician Assistant

## 2017-04-29 DIAGNOSIS — M62838 Other muscle spasm: Secondary | ICD-10-CM

## 2017-04-29 MED ORDER — TIZANIDINE HCL 4 MG PO TABS: 2.0000 mg | ORAL_TABLET | Freq: Four times a day (QID) | ORAL | 1 refills | Status: DC | PRN

## 2017-04-29 NOTE — Telephone Encounter (Signed)
[facility] for a 90-days supply for the following medication. Thanks CC ° °tiZANidine (ZANAFLEX) 4 MG tablet  ° °

## 2017-05-05 ENCOUNTER — Other Ambulatory Visit: Payer: Self-pay | Admitting: Physician Assistant

## 2017-05-05 DIAGNOSIS — M62838 Other muscle spasm: Secondary | ICD-10-CM

## 2017-05-05 NOTE — Telephone Encounter (Signed)
[facility] refill request for following medications: tiZANidine (ZANAFLEX) 4 MG tablet 90 day supply    Please advise,Thanks Pettit

## 2017-05-05 NOTE — Telephone Encounter (Signed)
Can we call them to see if they received the one from 3/7? It looks like they are requesting a 90 day supply vs the 30 day.

## 2017-05-05 NOTE — Telephone Encounter (Signed)
Looks like it was sent on 3/7. Is this too early to refill? Please advise. Thanks!

## 2017-05-06 MED ORDER — TIZANIDINE HCL 4 MG PO TABS: 2.0000 mg | ORAL_TABLET | Freq: Four times a day (QID) | ORAL | 1 refills | Status: DC | PRN

## 2017-05-06 NOTE — Telephone Encounter (Signed)
agreed

## 2017-05-06 NOTE — Telephone Encounter (Signed)
[facility] and they state that to equal the 90 day supply according to the instruction it needs to say qty:360. Gave verbal order with R:1   Thanks,  -Chiante Peden

## 2017-05-11 DIAGNOSIS — N83292 Other ovarian cyst, left side: Secondary | ICD-10-CM | POA: Diagnosis not present

## 2017-07-08 DIAGNOSIS — M1812 Unilateral primary osteoarthritis of first carpometacarpal joint, left hand: Secondary | ICD-10-CM | POA: Diagnosis not present

## 2017-07-29 ENCOUNTER — Other Ambulatory Visit: Payer: Self-pay | Admitting: Physician Assistant

## 2017-08-11 ENCOUNTER — Other Ambulatory Visit: Payer: Self-pay | Admitting: Physician Assistant

## 2017-08-11 DIAGNOSIS — F411 Generalized anxiety disorder: Secondary | ICD-10-CM

## 2017-08-11 MED ORDER — ALPRAZOLAM 0.25 MG PO TABS: ORAL_TABLET | ORAL | 1 refills | Status: DC

## 2017-08-11 NOTE — Telephone Encounter (Signed)
Patient calling for a refill on the following medication. [facility] Thanks CC  ALPRAZolam (XANAX) 0.25 MG tablet

## 2017-08-16 ENCOUNTER — Encounter: Payer: Self-pay | Admitting: Physician Assistant

## 2017-08-16 ENCOUNTER — Ambulatory Visit (INDEPENDENT_AMBULATORY_CARE_PROVIDER_SITE_OTHER): Payer: PPO | Admitting: Physician Assistant

## 2017-08-16 VITALS — BP 140/78 | HR 88 | Temp 97.9°F | Resp 16 | Wt 140.4 lb

## 2017-08-16 DIAGNOSIS — D508 Other iron deficiency anemias: Secondary | ICD-10-CM

## 2017-08-16 NOTE — Progress Notes (Signed)
Patient: Jamie [name] Female    DOB: Jan 29, 1941   77 y.o.   MRN: [id number] Visit Date: 08/16/2017  Today's Provider: Margaretann LovelessJennifer M Neyland Pettengill, PA-C   Chief Complaint  Patient presents with  . Follow-up    Hemoglobin   Subjective:    HPI  Hemoglobin, Follow up:  The patient was last seen for hemoglobin 5 months ago. Changes made since that visit include recommend to add ferrous sulfate 325mg  once daily with food  She reports good compliance with treatment. She is not having side effects. Marland Kitchen. ------------------------------------------------------------------------    Allergies  Allergen Reactions  . Codeine   . Penicillins Rash     Current Outpatient Medications:  .  ALPRAZolam (XANAX) 0.25 MG tablet, TAKE 1/2 TABLET BY MOUTH DAILY AS NEEDED, Disp: 45 tablet, Rfl: 1 .  Calcium Carbonate (CALCIUM 600 PO), Take 1 tablet by mouth daily. , Disp: , Rfl:  .  esomeprazole (NEXIUM) 40 MG capsule, Take 40 mg by mouth daily at 12 noon. , Disp: , Rfl:  .  estradiol (ESTRACE) 0.5 MG tablet, Take 1 tablet (0.5 mg total) by mouth daily., Disp: 90 tablet, Rfl: 1 .  MULTIPLE VITAMIN PO, Take 1 tablet by mouth daily. , Disp: , Rfl:  .  nortriptyline (PAMELOR) 50 MG capsule, TAKE 1 CAPSULE (50 MG TOTAL) BY MOUTH DAILY., Disp: 90 capsule, Rfl: 3 .  SYNTHROID 125 MCG tablet, Take 1 tablet (125 mcg total) by mouth daily., Disp: 90 tablet, Rfl: 1 .  tiZANidine (ZANAFLEX) 4 MG tablet, Take 0.5-1 tablets (2-4 mg total) by mouth every 6 (six) hours as needed for muscle spasms., Disp: 360 tablet, Rfl: 1 .  traMADol (ULTRAM) 50 MG tablet, TAKE 1 TABLET BY MOUTH EVERY EVENING AS NEEDED, Disp: 90 tablet, Rfl: 1 .  triamterene-hydrochlorothiazide (MAXZIDE-25) 37.5-25 MG tablet, TAKE 1/2 TABLET BY MOUTH EVERYDAY AS NEEDED, Disp: 45 tablet, Rfl: 1 .  Vitamin D, Cholecalciferol, 1000 UNITS CAPS, Take 1 capsule by mouth daily. , Disp: , Rfl:   Review of Systems  Constitutional: Positive for fatigue.    Respiratory: Negative for cough, chest tightness and shortness of breath.   Cardiovascular: Negative for chest pain, palpitations and leg swelling.  Gastrointestinal: Negative for abdominal pain.  Neurological: Negative for dizziness, weakness and light-headedness.    Social History   Tobacco Use  . Smoking status: Never Smoker  . Smokeless tobacco: Never Used  Substance Use Topics  . Alcohol use: No   Objective:   BP 140/78 (BP Location: Left Arm, Patient Position: Sitting, Cuff Size: Normal)   Pulse 88 Comment: 82  Temp 97.9 F (36.6 C) (Oral)   Resp 16   Wt 140 lb 6.4 oz (63.7 kg)   SpO2 97%   BMI 22.66 kg/m    Physical Exam  Constitutional: She appears well-developed and well-nourished. No distress.  Neck: Normal range of motion. Neck supple.  Cardiovascular: Normal rate, regular rhythm and normal heart sounds. Exam reveals no gallop and no friction rub.  No murmur heard. Pulmonary/Chest: Effort normal and breath sounds normal. No respiratory distress. She has no wheezes. She has no rales.  Skin: She is not diaphoretic.  Vitals reviewed.       Assessment & Plan:     1. Iron deficiency anemia secondary to inadequate dietary iron intake Will check labs as below and f/u pending results. If back to normal may stop ferrous sulfate. I will see her back in 6 months for her  CPE/AWV. She is to call if she has any acute issue in the meantime. - CBC with Differential/Platelet       Margaretann Loveless, PA-C  [facility]

## 2017-08-16 NOTE — Patient Instructions (Signed)

## 2017-08-17 ENCOUNTER — Telehealth: Payer: Self-pay

## 2017-08-17 LAB — CBC WITH DIFFERENTIAL/PLATELET
Basophils Absolute: 0 10*3/uL (ref 0.0–0.2)
Basos: 0 %
EOS (ABSOLUTE): 0.4 10*3/uL (ref 0.0–0.4)
Eos: 6 %
HEMOGLOBIN: 13.1 g/dL (ref 11.1–15.9)
Hematocrit: 38.4 % (ref 34.0–46.6)
IMMATURE GRANS (ABS): 0 10*3/uL (ref 0.0–0.1)
IMMATURE GRANULOCYTES: 0 %
LYMPHS: 30 %
Lymphocytes Absolute: 1.8 10*3/uL (ref 0.7–3.1)
MCH: 31.3 pg (ref 26.6–33.0)
MCHC: 34.1 g/dL (ref 31.5–35.7)
MCV: 92 fL (ref 79–97)
MONOCYTES: 10 %
Monocytes Absolute: 0.6 10*3/uL (ref 0.1–0.9)
NEUTROS PCT: 54 %
Neutrophils Absolute: 3.2 10*3/uL (ref 1.4–7.0)
PLATELETS: 267 10*3/uL (ref 150–450)
RBC: 4.19 x10E6/uL (ref 3.77–5.28)
RDW: 15.2 % (ref 12.3–15.4)
WBC: 6 10*3/uL (ref 3.4–10.8)

## 2017-08-17 NOTE — Telephone Encounter (Signed)
Pt returned call about getting results. Pt stated that she had already gotten the results but if you still need to speak with her please call her at home. Please advise. Thanks TNP

## 2017-08-17 NOTE — Telephone Encounter (Signed)
-----   Message from Margaretann LovelessJennifer M Burnette, PA-C sent at 08/17/2017  8:10 AM EDT ----- Hemoglobin is now in normal range. May discontinue ferrous sulfate if desired.

## 2017-08-17 NOTE — Telephone Encounter (Signed)
lmtcb

## 2017-08-17 NOTE — Telephone Encounter (Signed)
No need to speak patient. We just wanted to let her know about her labs results but I saw that you already viewed them. Viewed by Margaree MackintoshPatricia R [name] on 08/17/2017 8:22 AM

## 2017-09-07 ENCOUNTER — Other Ambulatory Visit: Payer: Self-pay | Admitting: Physician Assistant

## 2017-09-07 DIAGNOSIS — N959 Unspecified menopausal and perimenopausal disorder: Secondary | ICD-10-CM

## 2017-10-11 ENCOUNTER — Other Ambulatory Visit: Payer: Self-pay | Admitting: Physician Assistant

## 2017-10-11 DIAGNOSIS — H35363 Drusen (degenerative) of macula, bilateral: Secondary | ICD-10-CM | POA: Diagnosis not present

## 2017-10-11 DIAGNOSIS — H35373 Puckering of macula, bilateral: Secondary | ICD-10-CM | POA: Diagnosis not present

## 2017-10-13 DIAGNOSIS — H43823 Vitreomacular adhesion, bilateral: Secondary | ICD-10-CM | POA: Diagnosis not present

## 2017-10-13 DIAGNOSIS — H3530 Unspecified macular degeneration: Secondary | ICD-10-CM | POA: Diagnosis not present

## 2017-11-18 ENCOUNTER — Other Ambulatory Visit: Payer: Self-pay | Admitting: Physician Assistant

## 2017-11-18 DIAGNOSIS — G8929 Other chronic pain: Secondary | ICD-10-CM

## 2017-11-18 DIAGNOSIS — M5442 Lumbago with sciatica, left side: Principal | ICD-10-CM

## 2017-12-14 DIAGNOSIS — H43823 Vitreomacular adhesion, bilateral: Secondary | ICD-10-CM | POA: Diagnosis not present

## 2017-12-15 ENCOUNTER — Ambulatory Visit (INDEPENDENT_AMBULATORY_CARE_PROVIDER_SITE_OTHER): Payer: PPO

## 2017-12-15 DIAGNOSIS — Z23 Encounter for immunization: Secondary | ICD-10-CM | POA: Diagnosis not present

## 2018-01-27 ENCOUNTER — Ambulatory Visit (INDEPENDENT_AMBULATORY_CARE_PROVIDER_SITE_OTHER): Payer: PPO

## 2018-01-27 VITALS — BP 128/60 | HR 71 | Temp 97.8°F | Ht 66.0 in | Wt 142.4 lb

## 2018-01-27 DIAGNOSIS — Z0000 Encounter for general adult medical examination without abnormal findings: Secondary | ICD-10-CM

## 2018-01-27 NOTE — Patient Instructions (Signed)
Jamie [name] , Thank you for taking time to come for your Medicare Wellness Visit. I appreciate your ongoing commitment to your health goals. Please review the following plan we discussed and let me know if I can assist you in the future.   Screening recommendations/referrals: Colonoscopy: No longer required.  Mammogram: No longer required.  Bone Density: Up to date, due 02/2022 Recommended yearly ophthalmology/optometry visit for glaucoma screening and checkup Recommended yearly dental visit for hygiene and checkup  Vaccinations: Influenza vaccine: Up to date Pneumococcal vaccine: Completed series Tdap vaccine: Pt declines today.  Shingles vaccine: Pt declines today.     Advanced directives: Please bring a copy of your POA (Power of Attorney) and/or Living Will to your next appointment.   Conditions/risks identified: Continue trying to increase water intake to 6-8 8 oz glasses a day.   Next appointment: 02/02/18 @ 9:00 AM with Joycelyn ManJennifer Burnette.    Preventive Care 1665 Years and Older, Female Preventive care refers to lifestyle choices and visits with your health care provider that can promote health and wellness. What does preventive care include?  A yearly physical exam. This is also called an annual well check.  Dental exams once or twice a year.  Routine eye exams. Ask your health care provider how often you should have your eyes checked.  Personal lifestyle choices, including:  Daily care of your teeth and gums.  Regular physical activity.  Eating a healthy diet.  Avoiding tobacco and drug use.  Limiting alcohol use.  Practicing safe sex.  Taking low-dose aspirin every day.  Taking vitamin and mineral supplements as recommended by your health care provider. What happens during an annual well check? The services and screenings done by your health care provider during your annual well check will depend on your age, overall health, lifestyle risk factors, and family  history of disease. Counseling  Your health care provider may ask you questions about your:  Alcohol use.  Tobacco use.  Drug use.  Emotional well-being.  Home and relationship well-being.  Sexual activity.  Eating habits.  History of falls.  Memory and ability to understand (cognition).  Work and work Astronomerenvironment.  Reproductive health. Screening  You may have the following tests or measurements:  Height, weight, and BMI.  Blood pressure.  Lipid and cholesterol levels. These may be checked every 5 years, or more frequently if you are over 76 years old.  Skin check.  Lung cancer screening. You may have this screening every year starting at age 76 if you have a 30-pack-year history of smoking and currently smoke or have quit within the past 15 years.  Fecal occult blood test (FOBT) of the stool. You may have this test every year starting at age 76.  Flexible sigmoidoscopy or colonoscopy. You may have a sigmoidoscopy every 5 years or a colonoscopy every 10 years starting at age 76.  Hepatitis C blood test.  Hepatitis B blood test.  Sexually transmitted disease (STD) testing.  Diabetes screening. This is done by checking your blood sugar (glucose) after you have not eaten for a while (fasting). You may have this done every 1-3 years.  Bone density scan. This is done to screen for osteoporosis. You may have this done starting at age 76.  Mammogram. This may be done every 1-2 years. Talk to your health care provider about how often you should have regular mammograms. Talk with your health care provider about your test results, treatment options, and if necessary, the need for more  tests. Vaccines  Your health care provider may recommend certain vaccines, such as:  Influenza vaccine. This is recommended every year.  Tetanus, diphtheria, and acellular pertussis (Tdap, Td) vaccine. You may need a Td booster every 10 years.  Zoster vaccine. You may need this after  age 33.  Pneumococcal 13-valent conjugate (PCV13) vaccine. One dose is recommended after age 76.  Pneumococcal polysaccharide (PPSV23) vaccine. One dose is recommended after age 65. Talk to your health care provider about which screenings and vaccines you need and how often you need them. This information is not intended to replace advice given to you by your health care provider. Make sure you discuss any questions you have with your health care provider. Document Released: 03/08/2015 Document Revised: 10/30/2015 Document Reviewed: 12/11/2014 Elsevier Interactive Patient Education  2017 [employer] Prevention in the Home Falls can cause injuries. They can happen to people of all ages. There are many things you can do to make your home safe and to help prevent falls. What can I do on the outside of my home?  Regularly fix the edges of walkways and driveways and fix any cracks.  Remove anything that might make you trip as you walk through a door, such as a raised step or threshold.  Trim any bushes or trees on the path to your home.  Use bright outdoor lighting.  Clear any walking paths of anything that might make someone trip, such as rocks or tools.  Regularly check to see if handrails are loose or broken. Make sure that both sides of any steps have handrails.  Any raised decks and porches should have guardrails on the edges.  Have any leaves, snow, or ice cleared regularly.  Use sand or salt on walking paths during winter.  Clean up any spills in your garage right away. This includes oil or grease spills. What can I do in the bathroom?  Use night lights.  Install grab bars by the toilet and in the tub and shower. Do not use towel bars as grab bars.  Use non-skid mats or decals in the tub or shower.  If you need to sit down in the shower, use a plastic, non-slip stool.  Keep the floor dry. Clean up any water that spills on the floor as soon as it  happens.  Remove soap buildup in the tub or shower regularly.  Attach bath mats securely with double-sided non-slip rug tape.  Do not have throw rugs and other things on the floor that can make you trip. What can I do in the bedroom?  Use night lights.  Make sure that you have a light by your bed that is easy to reach.  Do not use any sheets or blankets that are too big for your bed. They should not hang down onto the floor.  Have a firm chair that has side arms. You can use this for support while you get dressed.  Do not have throw rugs and other things on the floor that can make you trip. What can I do in the kitchen?  Clean up any spills right away.  Avoid walking on wet floors.  Keep items that you use a lot in easy-to-reach places.  If you need to reach something above you, use a strong step stool that has a grab bar.  Keep electrical cords out of the way.  Do not use floor polish or wax that makes floors slippery. If you must use wax, use non-skid floor  wax.  Do not have throw rugs and other things on the floor that can make you trip. What can I do with my stairs?  Do not leave any items on the stairs.  Make sure that there are handrails on both sides of the stairs and use them. Fix handrails that are broken or loose. Make sure that handrails are as long as the stairways.  Check any carpeting to make sure that it is firmly attached to the stairs. Fix any carpet that is loose or worn.  Avoid having throw rugs at the top or bottom of the stairs. If you do have throw rugs, attach them to the floor with carpet tape.  Make sure that you have a light switch at the top of the stairs and the bottom of the stairs. If you do not have them, ask someone to add them for you. What else can I do to help prevent falls?  Wear shoes that:  Do not have high heels.  Have rubber bottoms.  Are comfortable and fit you well.  Are closed at the toe. Do not wear sandals.  If you  use a stepladder:  Make sure that it is fully opened. Do not climb a closed stepladder.  Make sure that both sides of the stepladder are locked into place.  Ask someone to hold it for you, if possible.  Clearly mark and make sure that you can see:  Any grab bars or handrails.  First and last steps.  Where the edge of each step is.  Use tools that help you move around (mobility aids) if they are needed. These include:  Canes.  Walkers.  Scooters.  Crutches.  Turn on the lights when you go into a dark area. Replace any light bulbs as soon as they burn out.  Set up your furniture so you have a clear path. Avoid moving your furniture around.  If any of your floors are uneven, fix them.  If there are any pets around you, be aware of where they are.  Review your medicines with your doctor. Some medicines can make you feel dizzy. This can increase your chance of falling. Ask your doctor what other things that you can do to help prevent falls. This information is not intended to replace advice given to you by your health care provider. Make sure you discuss any questions you have with your health care provider. Document Released: 12/06/2008 Document Revised: 07/18/2015 Document Reviewed: 03/16/2014 Elsevier Interactive Patient Education  2017 [employer].

## 2018-01-27 NOTE — Progress Notes (Signed)
Subjective:   Jamie [name] is a 77 y.o. female who presents for Medicare Annual (Subsequent) preventive examination.  Review of Systems:  N/A  Cardiac Risk Factors include: advanced age (>4155men, 67>65 women);dyslipidemia;hypertension     Objective:     Vitals: BP 128/60 (BP Location: Right Arm)   Pulse 71   Temp 97.8 F (36.6 C) (Oral)   Ht 5\' 6"  (1.676 m)   Wt 142 lb 6.4 oz (64.6 kg)   BMI 22.98 kg/m   Body mass index is 22.98 kg/m.  Advanced Directives 01/27/2018 01/13/2017 03/22/2015 12/25/2014 11/20/2014  Does Patient Have a Medical Advance Directive? Yes Yes Yes Yes Yes  Type of Estate agentAdvance Directive Healthcare Power of GarretsonAttorney;Living will Healthcare Power of RankinAttorney;Living will Healthcare Power of Attorney Living will;Healthcare Power of Attorney Living will;Healthcare Power of Attorney  Copy of Healthcare Power of Attorney in Chart? No - copy requested No - copy requested No - copy requested - -    Tobacco Social History   Tobacco Use  Smoking Status Never Smoker  Smokeless Tobacco Never Used     Counseling given: Not Answered   Clinical Intake:  Pre-visit preparation completed: No  Pain : No/denies pain Pain Score: 0-No pain     Nutritional Status: BMI of 19-24  Normal Nutritional Risks: None Diabetes: No  How often do you need to have someone help you when you read instructions, pamphlets, or other written materials from your doctor or pharmacy?: 1 - Never  Interpreter Needed?: No  Information entered by :: MMarkoski, LPN  Past Medical History:  Diagnosis Date  . Anemia   . Anxiety   . Arthritis   . GERD (gastroesophageal reflux disease)   . Headache   . Hypertension   . Hypothyroidism   . Macular degeneration    Past Surgical History:  Procedure Laterality Date  . ABDOMINAL HYSTERECTOMY  1980  . APPENDECTOMY    . CATARACT EXTRACTION Bilateral 2014  . CHOLECYSTECTOMY    . COLONOSCOPY WITH PROPOFOL N/A 03/22/2015   Procedure:  COLONOSCOPY WITH PROPOFOL;  Surgeon: Scot Junobert T Elliott, MD;  [facility];  Service: Endoscopy;  Laterality: N/A;  . TUBAL LIGATION     Family History  Problem Relation Age of Onset  . Hyperlipidemia Mother   . Heart attack Father   . Emphysema Father   . Healthy Sister   . Diabetes Brother   . Lymphoma Brother   . Stroke Maternal Grandmother   . Breast cancer Neg Hx    Social History   Socioeconomic History  . Marital status: Married    Spouse name: Not on file  . Number of children: 2  . Years of education: Not on file  . Highest education level: High school graduate  Occupational History  . Occupation: retired  Engineer, productionocial Needs  . Financial resource strain: Not hard at all  . Food insecurity:    Worry: Never true    Inability: Never true  . Transportation needs:    Medical: No    Non-medical: No  Tobacco Use  . Smoking status: Never Smoker  . Smokeless tobacco: Never Used  Substance and Sexual Activity  . Alcohol use: No  . Drug use: No  . Sexual activity: Not on file  Lifestyle  . Physical activity:    Days per week: 6 days    Minutes per session: 20 min  . Stress: Not at all  Relationships  . Social connections:    Talks on phone:  Patient refused    Gets together: Patient refused    Attends religious service: Patient refused    Active member of club or organization: Patient refused    Attends meetings of clubs or organizations: Patient refused    Relationship status: Patient refused  Other Topics Concern  . Not on file  Social History Narrative  . Not on file    Outpatient Encounter Medications as of 01/27/2018  Medication Sig  . ALPRAZolam (XANAX) 0.25 MG tablet TAKE 1/2 TABLET BY MOUTH DAILY AS NEEDED  . Calcium Carbonate (CALCIUM 600 PO) Take 1 tablet by mouth daily.   Marland Kitchen esomeprazole (NEXIUM) 40 MG capsule Take 40 mg by mouth daily at 12 noon.   Marland Kitchen estradiol (ESTRACE) 0.5 MG tablet TAKE 1 TABLET BY MOUTH EVERY DAY  . meloxicam (MOBIC) 7.5  MG tablet TAKE 1 TABLET BY MOUTH EVERY DAY AS NEEDED  . MULTIPLE VITAMIN PO Take 1 tablet by mouth daily.   . nortriptyline (PAMELOR) 50 MG capsule TAKE 1 CAPSULE (50 MG TOTAL) BY MOUTH DAILY.  . SYNTHROID 125 MCG tablet TAKE 1 TABLET BY MOUTH EVERY DAY  . tiZANidine (ZANAFLEX) 4 MG tablet Take 0.5-1 tablets (2-4 mg total) by mouth every 6 (six) hours as needed for muscle spasms.  . traMADol (ULTRAM) 50 MG tablet TAKE 1 TABLET BY MOUTH EVERY EVENING AS NEEDED  . triamterene-hydrochlorothiazide (MAXZIDE-25) 37.5-25 MG tablet TAKE 1/2 TABLET BY MOUTH EVERYDAY AS NEEDED  . Vitamin D, Cholecalciferol, 1000 UNITS CAPS Take 1 capsule by mouth daily.   . vitamin E 400 UNIT capsule Take 400 Units by mouth daily.   No facility-administered encounter medications on file as of 01/27/2018.     Activities of Daily Living In your present state of health, do you have any difficulty performing the following activities: 01/27/2018  Hearing? N  Vision? Y  Comment Has the early stages of MD.  Difficulty concentrating or making decisions? N  Walking or climbing stairs? N  Dressing or bathing? N  Doing errands, shopping? N  Preparing Food and eating ? N  Using the Toilet? N  In the past six months, have you accidently leaked urine? N  Do you have problems with loss of bowel control? N  Managing your Medications? N  Managing your Finances? N  Housekeeping or managing your Housekeeping? N  Some recent data might be hidden    Patient Care Team: Margaretann Loveless, PA-C as PCP - General (Family Medicine) Gibson Ramp, MD as Consulting Physician (Ophthalmology) Dedra Skeens, PA-C (Orthopedic Surgery)    Assessment:   This is a routine wellness examination for [name].  Exercise Activities and Dietary recommendations Current Exercise Habits: Home exercise routine, Type of exercise: walking, Time (Minutes): 35, Frequency (Times/Week): 6, Weekly Exercise (Minutes/Week): 210, Intensity: Mild,  Exercise limited by: None identified  Goals    . DIET - INCREASE WATER INTAKE     Recommend increasing water intake to 6-8 glasses a day.        Fall Risk Fall Risk  01/27/2018 01/13/2017 01/13/2016 12/25/2014  Falls in the past year? 0 No No No   FALL RISK PREVENTION PERTAINING TO THE HOME:  Any stairs in or around the home WITH handrails? No  Home free of loose throw rugs in walkways, pet beds, electrical cords, etc? Yes  Adequate lighting in your home to reduce risk of falls? Yes   ASSISTIVE DEVICES UTILIZED TO PREVENT FALLS:  Life alert? No  Use of a cane, walker  or w/c? No  Grab bars in the bathroom? Yes  Shower chair or bench in shower? No  Elevated toilet seat or a handicapped toilet? Yes    TIMED UP AND GO:  Was the test performed? No .     Depression Screen PHQ 2/9 Scores 01/27/2018 01/27/2018 01/13/2017 01/13/2016  PHQ - 2 Score 0 0 0 0  PHQ- 9 Score 0 - - -     Cognitive Function: Declined today.         Immunization History  Administered Date(s) Administered  . Influenza, High Dose Seasonal PF 01/03/2015, 12/12/2015, 12/03/2016, 12/15/2017  . Influenza-Unspecified 10/24/2013  . Pneumococcal Conjugate-13 01/10/2014  . Pneumococcal Polysaccharide-23 05/15/2010    Qualifies for Shingles Vaccine? Yes . Due for Shingrix. Education has been provided regarding the importance of this vaccine. Pt has been advised to call insurance company to determine out of pocket expense. Advised may also receive vaccine at local pharmacy or Health Dept. Verbalized acceptance and understanding.  Tdap: Although this vaccine is not a covered service during a Wellness Exam, does the patient still wish to receive this vaccine today?  No .  Education has been provided regarding the importance of this vaccine. Advised may receive this vaccine at local pharmacy or Health Dept. Aware to provide a copy of the vaccination record if obtained from local pharmacy or Health Dept. Verbalized  acceptance and understanding.  Flu Vaccine: Up to date  Pneumococcal Vaccine: Up to date   Screening Tests Health Maintenance  Topic Date Due  . TETANUS/TDAP  01/17/1960  . DEXA SCAN  03/11/2022  . INFLUENZA VACCINE  Completed  . PNA vac Low Risk Adult  Completed    Cancer Screenings:  Colorectal Screening: No longer required.   Mammogram: No longer required.   Bone Density: Completed 03/11/17. Results reflect OSTEOPENIA. Repeat every 5 years.   Lung Cancer Screening: (Low Dose CT Chest recommended if Age 8-80 years, 30 pack-year currently smoking OR have quit w/in 15years.) does not qualify.    Additional Screening:  Vision Screening: Recommended annual ophthalmology exams for early detection of glaucoma and other disorders of the eye.  Dental Screening: Recommended annual dental exams for proper oral hygiene  Community Resource Referral:  CRR required this visit?  No       Plan:  I have personally reviewed and addressed the Medicare Annual Wellness questionnaire and have noted the following in the patient's chart:  A. Medical and social history B. Use of alcohol, tobacco or illicit drugs  C. Current medications and supplements D. Functional ability and status E.  Nutritional status F.  Physical activity G. Advance directives H. List of other physicians I.  Hospitalizations, surgeries, and ER visits in previous 12 months J.  Vitals K. Screenings such as hearing and vision if needed, cognitive and depression L. Referrals and appointments - none  In addition, I have reviewed and discussed with patient certain preventive protocols, quality metrics, and best practice recommendations. A written personalized care plan for preventive services as well as general preventive health recommendations were provided to patient.  See attached scanned questionnaire for additional information.   Signed,  Hyacinth Meeker, LPN Nurse Health Advisor   Nurse  Recommendations: Pt declined the tetanus vaccine today.

## 2018-01-31 ENCOUNTER — Ambulatory Visit: Payer: Self-pay

## 2018-01-31 ENCOUNTER — Encounter: Payer: Self-pay | Admitting: Physician Assistant

## 2018-02-02 ENCOUNTER — Ambulatory Visit (INDEPENDENT_AMBULATORY_CARE_PROVIDER_SITE_OTHER): Payer: PPO | Admitting: Physician Assistant

## 2018-02-02 ENCOUNTER — Encounter: Payer: Self-pay | Admitting: Physician Assistant

## 2018-02-02 VITALS — BP 142/75 | HR 85 | Temp 97.6°F | Resp 16 | Ht 66.0 in | Wt 142.0 lb

## 2018-02-02 DIAGNOSIS — Z1239 Encounter for other screening for malignant neoplasm of breast: Secondary | ICD-10-CM | POA: Diagnosis not present

## 2018-02-02 DIAGNOSIS — E039 Hypothyroidism, unspecified: Secondary | ICD-10-CM | POA: Diagnosis not present

## 2018-02-02 DIAGNOSIS — Z0000 Encounter for general adult medical examination without abnormal findings: Secondary | ICD-10-CM

## 2018-02-02 DIAGNOSIS — H6123 Impacted cerumen, bilateral: Secondary | ICD-10-CM | POA: Diagnosis not present

## 2018-02-02 DIAGNOSIS — I10 Essential (primary) hypertension: Secondary | ICD-10-CM | POA: Diagnosis not present

## 2018-02-02 DIAGNOSIS — E7800 Pure hypercholesterolemia, unspecified: Secondary | ICD-10-CM | POA: Diagnosis not present

## 2018-02-02 MED ORDER — TRIAMTERENE-HCTZ 37.5-25 MG PO TABS: ORAL_TABLET | ORAL | 1 refills | Status: DC

## 2018-02-02 NOTE — Patient Instructions (Signed)
Health Maintenance for Postmenopausal Women Menopause is a normal process in which your reproductive ability comes to an end. This process happens gradually over a span of months to years, usually between the ages of 22 and 9. Menopause is complete when you have missed 12 consecutive menstrual periods. It is important to talk with your health care provider about some of the most common conditions that affect postmenopausal women, such as heart disease, cancer, and bone loss (osteoporosis). Adopting a healthy lifestyle and getting preventive care can help to promote your health and wellness. Those actions can also lower your chances of developing some of these common conditions. What should I know about menopause? During menopause, you may experience a number of symptoms, such as:  Moderate-to-severe hot flashes.  Night sweats.  Decrease in sex drive.  Mood swings.  Headaches.  Tiredness.  Irritability.  Memory problems.  Insomnia.  Choosing to treat or not to treat menopausal changes is an individual decision that you make with your health care provider. What should I know about hormone replacement therapy and supplements? Hormone therapy products are effective for treating symptoms that are associated with menopause, such as hot flashes and night sweats. Hormone replacement carries certain risks, especially as you become older. If you are thinking about using estrogen or estrogen with progestin treatments, discuss the benefits and risks with your health care provider. What should I know about heart disease and stroke? Heart disease, heart attack, and stroke become more likely as you age. This may be due, in part, to the hormonal changes that your body experiences during menopause. These can affect how your body processes dietary fats, triglycerides, and cholesterol. Heart attack and stroke are both medical emergencies. There are many things that you can do to help prevent heart disease  and stroke:  Have your blood pressure checked at least every 1-2 years. High blood pressure causes heart disease and increases the risk of stroke.  If you are 53-22 years old, ask your health care provider if you should take aspirin to prevent a heart attack or a stroke.  Do not use any tobacco products, including cigarettes, chewing tobacco, or electronic cigarettes. If you need help quitting, ask your health care provider.  It is important to eat a healthy diet and maintain a healthy weight. ? Be sure to include plenty of vegetables, fruits, low-fat dairy products, and lean protein. ? Avoid eating foods that are high in solid fats, added sugars, or salt (sodium).  Get regular exercise. This is one of the most important things that you can do for your health. ? Try to exercise for at least 150 minutes each week. The type of exercise that you do should increase your heart rate and make you sweat. This is known as moderate-intensity exercise. ? Try to do strengthening exercises at least twice each week. Do these in addition to the moderate-intensity exercise.  Know your numbers.Ask your health care provider to check your cholesterol and your blood glucose. Continue to have your blood tested as directed by your health care provider.  What should I know about cancer screening? There are several types of cancer. Take the following steps to reduce your risk and to catch any cancer development as early as possible. Breast Cancer  Practice breast self-awareness. ? This means understanding how your breasts normally appear and feel. ? It also means doing regular breast self-exams. Let your health care provider know about any changes, no matter how small.  If you are 40  or older, have a clinician do a breast exam (clinical breast exam or CBE) every year. Depending on your age, family history, and medical history, it may be recommended that you also have a yearly breast X-ray (mammogram).  If you  have a family history of breast cancer, talk with your health care provider about genetic screening.  If you are at high risk for breast cancer, talk with your health care provider about having an MRI and a mammogram every year.  Breast cancer (BRCA) gene test is recommended for women who have family members with BRCA-related cancers. Results of the assessment will determine the need for genetic counseling and BRCA1 and for BRCA2 testing. BRCA-related cancers include these types: ? Breast. This occurs in males or females. ? Ovarian. ? Tubal. This may also be called fallopian tube cancer. ? Cancer of the abdominal or pelvic lining (peritoneal cancer). ? Prostate. ? Pancreatic.  Cervical, Uterine, and Ovarian Cancer Your health care provider may recommend that you be screened regularly for cancer of the pelvic organs. These include your ovaries, uterus, and vagina. This screening involves a pelvic exam, which includes checking for microscopic changes to the surface of your cervix (Pap test).  For women ages 21-65, health care providers may recommend a pelvic exam and a Pap test every three years. For women ages 10-65, they may recommend the Pap test and pelvic exam, combined with testing for human papilloma virus (HPV), every five years. Some types of HPV increase your risk of cervical cancer. Testing for HPV may also be done on women of any age who have unclear Pap test results.  Other health care providers may not recommend any screening for nonpregnant women who are considered low risk for pelvic cancer and have no symptoms. Ask your health care provider if a screening pelvic exam is right for you.  If you have had past treatment for cervical cancer or a condition that could lead to cancer, you need Pap tests and screening for cancer for at least 20 years after your treatment. If Pap tests have been discontinued for you, your risk factors (such as having a new sexual partner) need to be  reassessed to determine if you should start having screenings again. Some women have medical problems that increase the chance of getting cervical cancer. In these cases, your health care provider may recommend that you have screening and Pap tests more often.  If you have a family history of uterine cancer or ovarian cancer, talk with your health care provider about genetic screening.  If you have vaginal bleeding after reaching menopause, tell your health care provider.  There are currently no reliable tests available to screen for ovarian cancer.  Lung Cancer Lung cancer screening is recommended for adults 89-26 years old who are at high risk for lung cancer because of a history of smoking. A yearly low-dose CT scan of the lungs is recommended if you:  Currently smoke.  Have a history of at least 30 pack-years of smoking and you currently smoke or have quit within the past 15 years. A pack-year is smoking an average of one pack of cigarettes per day for one year.  Yearly screening should:  Continue until it has been 15 years since you quit.  Stop if you develop a health problem that would prevent you from having lung cancer treatment.  Colorectal Cancer  This type of cancer can be detected and can often be prevented.  Routine colorectal cancer screening usually begins at  age 42 and continues through age 45.  If you have risk factors for colon cancer, your health care provider may recommend that you be screened at an earlier age.  If you have a family history of colorectal cancer, talk with your health care provider about genetic screening.  Your health care provider may also recommend using home test kits to check for hidden blood in your stool.  A small camera at the end of a tube can be used to examine your colon directly (sigmoidoscopy or colonoscopy). This is done to check for the earliest forms of colorectal cancer.  Direct examination of the colon should be repeated every  5-10 years until age 71. However, if early forms of precancerous polyps or small growths are found or if you have a family history or genetic risk for colorectal cancer, you may need to be screened more often.  Skin Cancer  Check your skin from head to toe regularly.  Monitor any moles. Be sure to tell your health care provider: ? About any new moles or changes in moles, especially if there is a change in a mole's shape or color. ? If you have a mole that is larger than the size of a pencil eraser.  If any of your family members has a history of skin cancer, especially at a young age, talk with your health care provider about genetic screening.  Always use sunscreen. Apply sunscreen liberally and repeatedly throughout the day.  Whenever you are outside, protect yourself by wearing long sleeves, pants, a wide-brimmed hat, and sunglasses.  What should I know about osteoporosis? Osteoporosis is a condition in which bone destruction happens more quickly than new bone creation. After menopause, you may be at an increased risk for osteoporosis. To help prevent osteoporosis or the bone fractures that can happen because of osteoporosis, the following is recommended:  If you are 46-71 years old, get at least 1,000 mg of calcium and at least 600 mg of vitamin D per day.  If you are older than age 55 but younger than age 65, get at least 1,200 mg of calcium and at least 600 mg of vitamin D per day.  If you are older than age 54, get at least 1,200 mg of calcium and at least 800 mg of vitamin D per day.  Smoking and excessive alcohol intake increase the risk of osteoporosis. Eat foods that are rich in calcium and vitamin D, and do weight-bearing exercises several times each week as directed by your health care provider. What should I know about how menopause affects my mental health? Depression may occur at any age, but it is more common as you become older. Common symptoms of depression  include:  Low or sad mood.  Changes in sleep patterns.  Changes in appetite or eating patterns.  Feeling an overall lack of motivation or enjoyment of activities that you previously enjoyed.  Frequent crying spells.  Talk with your health care provider if you think that you are experiencing depression. What should I know about immunizations? It is important that you get and maintain your immunizations. These include:  Tetanus, diphtheria, and pertussis (Tdap) booster vaccine.  Influenza every year before the flu season begins.  Pneumonia vaccine.  Shingles vaccine.  Your health care provider may also recommend other immunizations. This information is not intended to replace advice given to you by your health care provider. Make sure you discuss any questions you have with your health care provider. Document Released: 04/03/2005  Document Revised: 08/30/2015 Document Reviewed: 11/13/2014 Elsevier Interactive Patient Education  2018 [employer]  

## 2018-02-02 NOTE — Progress Notes (Signed)
Patient: Jamie [name], Female    DOB: 04-Mar-1940, 77 y.o.   MRN: [id number] Visit Date: 02/02/2018  Today's Provider: Margaretann Loveless, PA-C   Chief Complaint  Patient presents with  . Medicare Wellness   Subjective:     Complete Physical Jamie [name] is a 77 y.o. female. She feels well. She reports exercising 5 days a week. She reports she is sleeping well. 01/27/18 Medicare visit with [facility] 03/11/17 Mammogram-BI-RADS 1 03/11/17 BMD-Osteopenia 03/22/15 Colonoscopy-WNL -----------------------------------------------------------   Review of Systems  Constitutional: Negative.   HENT: Positive for ear pain.   Eyes: Negative.   Respiratory: Negative.   Cardiovascular: Negative.   Gastrointestinal: Negative.   Endocrine: Positive for cold intolerance.  Genitourinary: Negative.   Musculoskeletal: Positive for back pain.  Skin: Negative.   Allergic/Immunologic: Negative.   Neurological: Negative.   Hematological: Negative.   Psychiatric/Behavioral: Negative.     Social History   Socioeconomic History  . Marital status: Married    Spouse name: Not on file  . Number of children: 2  . Years of education: Not on file  . Highest education level: High school graduate  Occupational History  . Occupation: retired  Engineer, production  . Financial resource strain: Not hard at all  . Food insecurity:    Worry: Never true    Inability: Never true  . Transportation needs:    Medical: No    Non-medical: No  Tobacco Use  . Smoking status: Never Smoker  . Smokeless tobacco: Never Used  Substance and Sexual Activity  . Alcohol use: No  . Drug use: No  . Sexual activity: Not on file  Lifestyle  . Physical activity:    Days per week: 6 days    Minutes per session: 20 min  . Stress: Not at all  Relationships  . Social connections:    Talks on phone: Patient refused    Gets together: Patient refused    Attends religious service: Patient refused    Active  member of club or organization: Patient refused    Attends meetings of clubs or organizations: Patient refused    Relationship status: Patient refused  . Intimate partner violence:    Fear of current or ex partner: Patient refused    Emotionally abused: Patient refused    Physically abused: Patient refused    Forced sexual activity: Patient refused  Other Topics Concern  . Not on file  Social History Narrative  . Not on file    Past Medical History:  Diagnosis Date  . Anemia   . Anxiety   . Arthritis   . GERD (gastroesophageal reflux disease)   . Headache   . Hypertension   . Hypothyroidism   . Macular degeneration      Patient Active Problem List   Diagnosis Date Noted  . Cyst of ovary 05/03/2015  . Foot pain 11/22/2014  . Dysfunction of eustachian tube 11/20/2014  . Hypercholesteremia 11/20/2014  . Cardiac murmur 11/20/2014  . Temporary cerebral vascular dysfunction 11/20/2014  . Gout attack 11/20/2014  . Shortness of breath 11/20/2014  . Essential (primary) hypertension 11/20/2014  . L-S radiculopathy 09/27/2014  . Migraine 07/31/2014  . Hypothyroidism 07/31/2014  . Colon polyp 12/24/2009  . LBP (low back pain) 06/28/2009  . Anxiety state 10/26/2008  . Esophageal stenosis 10/26/2008  . Hemorrhoids, internal 10/26/2008  . Menopausal and perimenopausal disorder 10/26/2008  . Gastro-esophageal reflux disease without esophagitis 10/26/2008  . Edema 10/26/2008  .  Allergic rhinitis 07/13/2006    Past Surgical History:  Procedure Laterality Date  . ABDOMINAL HYSTERECTOMY  1980  . APPENDECTOMY    . CATARACT EXTRACTION Bilateral 2014  . CHOLECYSTECTOMY    . COLONOSCOPY WITH PROPOFOL N/A 03/22/2015   Procedure: COLONOSCOPY WITH PROPOFOL;  Surgeon: Scot Jun, MD;  Location: [facility] ENDOSCOPY;  Service: Endoscopy;  Laterality: N/A;  . TUBAL LIGATION      Her family history includes Diabetes in her brother; Emphysema in her father; Healthy in her sister; Heart  attack in her father; Hyperlipidemia in her mother; Lymphoma in her brother; Stroke in her maternal grandmother. There is no history of Breast cancer.      Current Outpatient Medications:  .  ALPRAZolam (XANAX) 0.25 MG tablet, TAKE 1/2 TABLET BY MOUTH DAILY AS NEEDED, Disp: 45 tablet, Rfl: 1 .  Calcium Carbonate (CALCIUM 600 PO), Take 1 tablet by mouth daily. , Disp: , Rfl:  .  esomeprazole (NEXIUM) 40 MG capsule, Take 40 mg by mouth daily at 12 noon. , Disp: , Rfl:  .  estradiol (ESTRACE) 0.5 MG tablet, TAKE 1 TABLET BY MOUTH EVERY DAY, Disp: 90 tablet, Rfl: 1 .  meloxicam (MOBIC) 7.5 MG tablet, TAKE 1 TABLET BY MOUTH EVERY DAY AS NEEDED, Disp: 90 tablet, Rfl: 1 .  MULTIPLE VITAMIN PO, Take 1 tablet by mouth daily. , Disp: , Rfl:  .  nortriptyline (PAMELOR) 50 MG capsule, TAKE 1 CAPSULE (50 MG TOTAL) BY MOUTH DAILY., Disp: 90 capsule, Rfl: 3 .  SYNTHROID 125 MCG tablet, TAKE 1 TABLET BY MOUTH EVERY DAY, Disp: 90 tablet, Rfl: 1 .  tiZANidine (ZANAFLEX) 4 MG tablet, Take 0.5-1 tablets (2-4 mg total) by mouth every 6 (six) hours as needed for muscle spasms., Disp: 360 tablet, Rfl: 1 .  traMADol (ULTRAM) 50 MG tablet, TAKE 1 TABLET BY MOUTH EVERY EVENING AS NEEDED, Disp: 90 tablet, Rfl: 1 .  triamterene-hydrochlorothiazide (MAXZIDE-25) 37.5-25 MG tablet, TAKE 1/2 TABLET BY MOUTH EVERYDAY AS NEEDED, Disp: 45 tablet, Rfl: 1 .  Vitamin D, Cholecalciferol, 1000 UNITS CAPS, Take 1 capsule by mouth daily. , Disp: , Rfl:  .  vitamin E 400 UNIT capsule, Take 400 Units by mouth daily., Disp: , Rfl:   Patient Care Team: Margaretann Loveless, PA-C as PCP - General (Family Medicine) Gibson Ramp, MD as Consulting Physician (Ophthalmology) Dedra Skeens, PA-C (Orthopedic Surgery)     Objective:   Vitals: BP (!) 142/75 (BP Location: Left Arm, Patient Position: Sitting, Cuff Size: Normal)   Pulse 85   Temp 97.6 F (36.4 C) (Oral)   Resp 16   Ht 5\' 6"  (1.676 m)   Wt 142 lb (64.4 kg)   BMI 22.92  kg/m   Physical Exam  Constitutional: She is oriented to person, place, and time. She appears well-developed and well-nourished. No distress.  HENT:  Head: Normocephalic and atraumatic.  Right Ear: External ear and ear canal normal.  Left Ear: External ear and ear canal normal.  Nose: Nose normal.  Mouth/Throat: Oropharynx is clear and moist. No oropharyngeal exudate.  Cerumen impaction noted bilaterally  Eyes: Pupils are equal, round, and reactive to light. Conjunctivae and EOM are normal. Right eye exhibits no discharge. Left eye exhibits no discharge. No scleral icterus.  Neck: Normal range of motion. Neck supple. No JVD present. Carotid bruit is not present. No tracheal deviation present. No thyromegaly present.  Cardiovascular: Normal rate, regular rhythm, normal heart sounds and intact distal pulses. Exam reveals no  gallop and no friction rub.  No murmur heard. Pulmonary/Chest: Effort normal and breath sounds normal. No respiratory distress. She has no wheezes. She has no rales. She exhibits no tenderness.  Abdominal: Soft. Bowel sounds are normal. She exhibits no distension and no mass. There is no tenderness. There is no rebound and no guarding.  Musculoskeletal: Normal range of motion. She exhibits no edema or tenderness.  Lymphadenopathy:    She has no cervical adenopathy.  Neurological: She is alert and oriented to person, place, and time.  Skin: Skin is warm and dry. No rash noted. She is not diaphoretic.  Psychiatric: She has a normal mood and affect. Her behavior is normal. Judgment and thought content normal.  Vitals reviewed.   Activities of Daily Living In your present state of health, do you have any difficulty performing the following activities: 01/27/2018  Hearing? N  Vision? Y  Comment Has the early stages of MD.  Difficulty concentrating or making decisions? N  Walking or climbing stairs? N  Dressing or bathing? N  Doing errands, shopping? N  Preparing Food  and eating ? N  Using the Toilet? N  In the past six months, have you accidently leaked urine? N  Do you have problems with loss of bowel control? N  Managing your Medications? N  Managing your Finances? N  Housekeeping or managing your Housekeeping? N  Some recent data might be hidden    Fall Risk Assessment Fall Risk  01/27/2018 01/13/2017 01/13/2016 12/25/2014  Falls in the past year? 0 No No No     Depression Screen PHQ 2/9 Scores 01/27/2018 01/27/2018 01/13/2017 01/13/2016  PHQ - 2 Score 0 0 0 0  PHQ- 9 Score 0 - - -    No flowsheet data found.    Assessment & Plan:    Annual Physical Reviewed patient's Family Medical History Reviewed and updated list of patient's medical providers Assessment of cognitive impairment was done Assessed patient's functional ability Established a written schedule for health screening services Health Risk Assessent Completed and Reviewed  Exercise Activities and Dietary recommendations Goals    . DIET - INCREASE WATER INTAKE     Recommend increasing water intake to 6-8 glasses a day.        Immunization History  Administered Date(s) Administered  . Influenza, High Dose Seasonal PF 01/03/2015, 12/12/2015, 12/03/2016, 12/15/2017  . Influenza-Unspecified 10/24/2013  . Pneumococcal Conjugate-13 01/10/2014  . Pneumococcal Polysaccharide-23 05/15/2010    Health Maintenance  Topic Date Due  . TETANUS/TDAP  01/17/1961  . DEXA SCAN  03/11/2022  . INFLUENZA VACCINE  Completed  . PNA vac Low Risk Adult  Completed     Discussed health benefits of physical activity, and encouraged her to engage in regular exercise appropriate for her age and condition.    1. Annual physical exam Normal physical exam today. Will check labs as below and f/u pending lab results. If labs are stable and WNL she will not need to have these rechecked for one year at her next annual physical exam. She is to call the office in the meantime if she has any acute  issue, questions or concerns.  2. Breast cancer screening There is no family history of breast cancer. She does perform regular self breast exams. Mammogram was ordered as below. [facility] given [facility] patient so she may schedule her mammogram at her convenience. - MM 3D SCREEN BREAST BILATERAL; Future  3. Essential (primary) hypertension Stable. Diagnosis pulled for  medication refill. Continue current medical treatment plan. Will check labs as below and f/u pending results. - CBC with Differential/Platelet - Comprehensive metabolic panel - triamterene-hydrochlorothiazide (MAXZIDE-25) 37.5-25 MG tablet; TAKE 1/2 TABLET BY MOUTH EVERYDAY AS NEEDED  Dispense: 45 tablet; Refill: 1  4. Hypothyroidism, unspecified type Stable on levothyroxine . Will check labs as below and f/u pending results. - TSH  5. Hypercholesterolemia Diet controlled. Will check labs as below and f/u pending results. - CBC with Differential/Platelet - Comprehensive metabolic panel - Lipid Panel With LDL/HDL Ratio  6. Bilateral impacted cerumen Patient declines lavage here. States she will see Dr. Jenne Campus for lavage.  ------------------------------------------------------------------------------------------------------------    Margaretann Loveless, PA-C  [facility]

## 2018-02-07 DIAGNOSIS — H43823 Vitreomacular adhesion, bilateral: Secondary | ICD-10-CM | POA: Diagnosis not present

## 2018-02-07 DIAGNOSIS — H353132 Nonexudative age-related macular degeneration, bilateral, intermediate dry stage: Secondary | ICD-10-CM | POA: Diagnosis not present

## 2018-02-10 ENCOUNTER — Other Ambulatory Visit: Payer: Self-pay | Admitting: Physician Assistant

## 2018-02-10 DIAGNOSIS — F411 Generalized anxiety disorder: Secondary | ICD-10-CM

## 2018-03-13 ENCOUNTER — Other Ambulatory Visit: Payer: Self-pay | Admitting: Physician Assistant

## 2018-03-13 DIAGNOSIS — N959 Unspecified menopausal and perimenopausal disorder: Secondary | ICD-10-CM

## 2018-03-25 ENCOUNTER — Ambulatory Visit (INDEPENDENT_AMBULATORY_CARE_PROVIDER_SITE_OTHER): Payer: PPO | Admitting: Physician Assistant

## 2018-03-25 ENCOUNTER — Encounter: Payer: Self-pay | Admitting: Physician Assistant

## 2018-03-25 VITALS — BP 138/80 | HR 81 | Temp 97.7°F | Resp 16 | Wt 142.0 lb

## 2018-03-25 DIAGNOSIS — J0140 Acute pansinusitis, unspecified: Secondary | ICD-10-CM | POA: Diagnosis not present

## 2018-03-25 DIAGNOSIS — R05 Cough: Secondary | ICD-10-CM | POA: Diagnosis not present

## 2018-03-25 DIAGNOSIS — R059 Cough, unspecified: Secondary | ICD-10-CM

## 2018-03-25 MED ORDER — BENZONATATE 200 MG PO CAPS: 200.0000 mg | ORAL_CAPSULE | Freq: Three times a day (TID) | ORAL | 0 refills | Status: DC | PRN

## 2018-03-25 MED ORDER — AZITHROMYCIN 250 MG PO TABS: ORAL_TABLET | ORAL | 0 refills | Status: DC

## 2018-03-25 NOTE — Patient Instructions (Signed)

## 2018-03-25 NOTE — Progress Notes (Signed)
Patient: Jamie Mackintoshatricia R Melberg Female    DOB: 1940-09-08   78 y.o.   MRN: [id number] Visit Date: 03/25/2018  Today's Provider: Margaretann LovelessJennifer M Zahriah Roes, PA-C   Chief Complaint  Patient presents with  . Sinusitis   Subjective:     Sinusitis  This is a new problem. The current episode started in the past 7 days. The problem has been gradually worsening since onset. There has been no fever. Associated symptoms include chills, congestion, coughing, headaches, sinus pressure, sneezing and a sore throat. Pertinent negatives include no diaphoresis, ear pain, hoarse voice, neck pain, shortness of breath or swollen glands. Treatments tried: coricidin. The treatment provided mild relief.   Patient states she has had sinus congestion and cough for 1 week. Patient also has symptoms of chills, headaches, sore throat, sinus pressure and pain. Patient has been taking otc coricidin with mild relief.   Allergies  Allergen Reactions  . Codeine   . Penicillins Rash     Current Outpatient Medications:  .  ALPRAZolam (XANAX) 0.25 MG tablet, TAKE 1/2 TABLET BY MOUTH DAILY AS NEEDED, Disp: 45 tablet, Rfl: 1 .  Calcium Carbonate (CALCIUM 600 PO), Take 1 tablet by mouth daily. , Disp: , Rfl:  .  esomeprazole (NEXIUM) 40 MG capsule, Take 40 mg by mouth daily at 12 noon. , Disp: , Rfl:  .  estradiol (ESTRACE) 0.5 MG tablet, TAKE 1 TABLET BY MOUTH EVERY DAY, Disp: 90 tablet, Rfl: 1 .  meloxicam (MOBIC) 7.5 MG tablet, TAKE 1 TABLET BY MOUTH EVERY DAY AS NEEDED, Disp: 90 tablet, Rfl: 1 .  MULTIPLE VITAMIN PO, Take 1 tablet by mouth daily. , Disp: , Rfl:  .  nortriptyline (PAMELOR) 50 MG capsule, TAKE 1 CAPSULE (50 MG TOTAL) BY MOUTH DAILY., Disp: 90 capsule, Rfl: 3 .  SYNTHROID 125 MCG tablet, TAKE 1 TABLET BY MOUTH EVERY DAY, Disp: 90 tablet, Rfl: 1 .  tiZANidine (ZANAFLEX) 4 MG tablet, Take 0.5-1 tablets (2-4 mg total) by mouth every 6 (six) hours as needed for muscle spasms., Disp: 360 tablet, Rfl: 1 .   triamterene-hydrochlorothiazide (MAXZIDE-25) 37.5-25 MG tablet, TAKE 1/2 TABLET BY MOUTH EVERYDAY AS NEEDED, Disp: 45 tablet, Rfl: 1 .  Vitamin D, Cholecalciferol, 1000 UNITS CAPS, Take 1 capsule by mouth daily. , Disp: , Rfl:  .  vitamin E 400 UNIT capsule, Take 400 Units by mouth daily., Disp: , Rfl:   Review of Systems  Constitutional: Positive for chills. Negative for diaphoresis.  HENT: Positive for congestion, sinus pressure, sinus pain, sneezing and sore throat. Negative for ear pain and hoarse voice.   Respiratory: Positive for cough. Negative for shortness of breath and wheezing.   Musculoskeletal: Negative for neck pain.  Neurological: Positive for headaches.    Social History   Tobacco Use  . Smoking status: Never Smoker  . Smokeless tobacco: Never Used  Substance Use Topics  . Alcohol use: No      Objective:   BP 138/80 (BP Location: Left Arm, Patient Position: Sitting, Cuff Size: Normal)   Pulse 81   Temp 97.7 F (36.5 C) (Oral)   Resp 16   Wt 142 lb (64.4 kg)   SpO2 98%   BMI 22.92 kg/m  Vitals:   03/25/18 1135  BP: 138/80  Pulse: 81  Resp: 16  Temp: 97.7 F (36.5 C)  TempSrc: Oral  SpO2: 98%  Weight: 142 lb (64.4 kg)     Physical Exam Vitals signs reviewed.  Constitutional:  General: She is not in acute distress.    Appearance: She is well-developed. She is not diaphoretic.  HENT:     Head: Normocephalic and atraumatic.     Right Ear: Hearing, tympanic membrane, ear canal and external ear normal.     Left Ear: Hearing, tympanic membrane, ear canal and external ear normal.     Nose:     Right Sinus: Maxillary sinus tenderness and frontal sinus tenderness present.     Left Sinus: Maxillary sinus tenderness and frontal sinus tenderness present.     Mouth/Throat:     Pharynx: Uvula midline. Posterior oropharyngeal erythema present. No oropharyngeal exudate.  Neck:     Musculoskeletal: Normal range of motion and neck supple.     Thyroid: No  thyromegaly.     Trachea: No tracheal deviation.  Cardiovascular:     Rate and Rhythm: Normal rate and regular rhythm.     Heart sounds: Normal heart sounds. No murmur. No friction rub. No gallop.   Pulmonary:     Effort: Pulmonary effort is normal. No respiratory distress.     Breath sounds: Normal breath sounds. No stridor. No wheezing or rales.  Lymphadenopathy:     Cervical: No cervical adenopathy.        Assessment & Plan    1. Acute non-recurrent pansinusitis Worsening symptoms that have not responded to OTC medications. Will give azithromycin as below. Continue allergy medications. Stay well hydrated and get plenty of rest. Call if no symptom improvement or if symptoms worsen. - azithromycin (ZITHROMAX) 250 MG tablet; Take 2 tablets PO on day one, and one tablet PO daily thereafter until completed.  Dispense: 6 tablet; Refill: 0  2. Cough Tessalon for cough. Push fluids.  - benzonatate (TESSALON) 200 MG capsule; Take 1 capsule (200 mg total) by mouth 3 (three) times daily as needed for cough.  Dispense: 30 capsule; Refill: 0     Margaretann Loveless, PA-C  [facility]

## 2018-04-04 ENCOUNTER — Other Ambulatory Visit: Payer: Self-pay | Admitting: Physician Assistant

## 2018-04-04 NOTE — Telephone Encounter (Signed)
It looks like patient didn't get her labs done.

## 2018-04-14 ENCOUNTER — Telehealth: Payer: Self-pay | Admitting: Physician Assistant

## 2018-04-14 DIAGNOSIS — I10 Essential (primary) hypertension: Secondary | ICD-10-CM

## 2018-04-14 NOTE — Telephone Encounter (Signed)
Pt called asking if you can change her fluid pill.  Dr Orland Jarred things the one she is on is causing gout in her feet.  She said she is on HCTZ  CB# [phone]  [facility]  Thanks Barth Kirks

## 2018-04-14 NOTE — Telephone Encounter (Signed)
Please review and advise. KW 

## 2018-04-15 MED ORDER — BUMETANIDE 0.5 MG PO TABS: 0.5000 mg | ORAL_TABLET | Freq: Every day | ORAL | 0 refills | Status: DC

## 2018-04-15 NOTE — Telephone Encounter (Signed)
Will stop maxzide. Most other diuretics can cause gout worse, except bumetandine. I will send this in and start at lowest dose. May need to see her in 4-6 weeks to see how her BP is tolerating and recheck labs.

## 2018-04-15 NOTE — Telephone Encounter (Signed)
Please Review

## 2018-04-15 NOTE — Telephone Encounter (Signed)
Patient advised and scheduled.  

## 2018-04-26 ENCOUNTER — Ambulatory Visit
Admission: RE | Admit: 2018-04-26 | Discharge: 2018-04-26 | Disposition: A | Discharge status: Home / Self Care | Payer: PPO | Source: Ambulatory Visit | Attending: Physician Assistant | Admitting: Physician Assistant

## 2018-04-26 ENCOUNTER — Telehealth: Payer: Self-pay

## 2018-04-26 DIAGNOSIS — Z1231 Encounter for screening mammogram for malignant neoplasm of breast: Secondary | ICD-10-CM | POA: Insufficient documentation

## 2018-04-26 DIAGNOSIS — Z1239 Encounter for other screening for malignant neoplasm of breast: Secondary | ICD-10-CM | POA: Diagnosis present

## 2018-04-26 DIAGNOSIS — M5431 Sciatica, right side: Secondary | ICD-10-CM

## 2018-04-26 DIAGNOSIS — M5432 Sciatica, left side: Principal | ICD-10-CM

## 2018-04-26 MED ORDER — TRAMADOL HCL 50 MG PO TABS: 50.0000 mg | ORAL_TABLET | Freq: Three times a day (TID) | ORAL | 0 refills | Status: DC | PRN

## 2018-04-26 NOTE — Telephone Encounter (Signed)
Sent in

## 2018-04-26 NOTE — Telephone Encounter (Signed)
Patient is requesting a refill on a prescription that Dr. Elease Hashimoto gave her in the past for sciatica. She would like a refill on Tramadol 50mg  1 tablet every evening as needed. She states that she usually gets this refilled every 1-2 years. This medication is not listed in her active medication list. Please advise. [facility]

## 2018-04-27 ENCOUNTER — Telehealth: Payer: Self-pay | Admitting: *Deleted

## 2018-04-27 NOTE — Telephone Encounter (Signed)
No answer and no vm. Will try again later.  

## 2018-04-27 NOTE — Telephone Encounter (Signed)
Patient advised as below.  

## 2018-04-27 NOTE — Telephone Encounter (Signed)
-----   Message from Margaretann Loveless, [place] sent at 04/26/2018  5:37 PM EST ----- Normal mammogram. Repeat screening in one year.

## 2018-04-28 ENCOUNTER — Other Ambulatory Visit: Payer: Self-pay | Admitting: Physician Assistant

## 2018-04-28 DIAGNOSIS — I10 Essential (primary) hypertension: Secondary | ICD-10-CM

## 2018-04-28 MED ORDER — BUMETANIDE 0.5 MG PO TABS: 0.5000 mg | ORAL_TABLET | Freq: Every day | ORAL | 1 refills | Status: DC

## 2018-04-28 NOTE — Addendum Note (Signed)
Addended by: Margaretann Loveless on: 04/28/2018 10:23 AM   Modules accepted: Orders

## 2018-05-06 ENCOUNTER — Ambulatory Visit: Payer: Self-pay | Admitting: Physician Assistant

## 2018-05-22 ENCOUNTER — Other Ambulatory Visit: Payer: Self-pay | Admitting: Physician Assistant

## 2018-05-22 DIAGNOSIS — M5442 Lumbago with sciatica, left side: Principal | ICD-10-CM

## 2018-05-22 DIAGNOSIS — G8929 Other chronic pain: Secondary | ICD-10-CM

## 2018-05-23 DIAGNOSIS — N83292 Other ovarian cyst, left side: Secondary | ICD-10-CM | POA: Diagnosis not present

## 2018-07-22 ENCOUNTER — Other Ambulatory Visit: Payer: Self-pay | Admitting: Physician Assistant

## 2018-08-03 ENCOUNTER — Other Ambulatory Visit: Payer: Self-pay | Admitting: Physician Assistant

## 2018-08-03 DIAGNOSIS — I10 Essential (primary) hypertension: Secondary | ICD-10-CM

## 2018-08-05 DIAGNOSIS — H61031 Chondritis of right external ear: Secondary | ICD-10-CM | POA: Diagnosis not present

## 2018-08-05 DIAGNOSIS — L821 Other seborrheic keratosis: Secondary | ICD-10-CM | POA: Diagnosis not present

## 2018-08-05 DIAGNOSIS — I872 Venous insufficiency (chronic) (peripheral): Secondary | ICD-10-CM | POA: Diagnosis not present

## 2018-08-06 ENCOUNTER — Other Ambulatory Visit: Payer: Self-pay | Admitting: Physician Assistant

## 2018-08-06 DIAGNOSIS — F411 Generalized anxiety disorder: Secondary | ICD-10-CM

## 2018-08-10 DIAGNOSIS — H43823 Vitreomacular adhesion, bilateral: Secondary | ICD-10-CM | POA: Diagnosis not present

## 2018-08-10 DIAGNOSIS — H353132 Nonexudative age-related macular degeneration, bilateral, intermediate dry stage: Secondary | ICD-10-CM | POA: Diagnosis not present

## 2018-09-06 ENCOUNTER — Other Ambulatory Visit: Payer: Self-pay | Admitting: Physician Assistant

## 2018-09-06 DIAGNOSIS — N959 Unspecified menopausal and perimenopausal disorder: Secondary | ICD-10-CM

## 2018-09-28 ENCOUNTER — Other Ambulatory Visit: Payer: Self-pay | Admitting: Physician Assistant

## 2018-10-21 ENCOUNTER — Other Ambulatory Visit: Payer: Self-pay | Admitting: Physician Assistant

## 2018-10-21 DIAGNOSIS — G8929 Other chronic pain: Secondary | ICD-10-CM

## 2018-10-21 DIAGNOSIS — M5442 Lumbago with sciatica, left side: Secondary | ICD-10-CM

## 2018-11-06 ENCOUNTER — Other Ambulatory Visit: Payer: Self-pay | Admitting: Physician Assistant

## 2018-11-06 DIAGNOSIS — N959 Unspecified menopausal and perimenopausal disorder: Secondary | ICD-10-CM

## 2018-11-08 DIAGNOSIS — H353132 Nonexudative age-related macular degeneration, bilateral, intermediate dry stage: Secondary | ICD-10-CM | POA: Diagnosis not present

## 2018-11-11 DIAGNOSIS — Z23 Encounter for immunization: Secondary | ICD-10-CM | POA: Diagnosis not present

## 2018-11-23 DIAGNOSIS — H353211 Exudative age-related macular degeneration, right eye, with active choroidal neovascularization: Secondary | ICD-10-CM | POA: Diagnosis not present

## 2018-12-25 ENCOUNTER — Other Ambulatory Visit: Payer: Self-pay | Admitting: Physician Assistant

## 2018-12-25 DIAGNOSIS — M62838 Other muscle spasm: Secondary | ICD-10-CM

## 2018-12-28 DIAGNOSIS — H353211 Exudative age-related macular degeneration, right eye, with active choroidal neovascularization: Secondary | ICD-10-CM | POA: Diagnosis not present

## 2019-01-18 ENCOUNTER — Other Ambulatory Visit: Payer: Self-pay

## 2019-01-29 ENCOUNTER — Other Ambulatory Visit: Payer: Self-pay | Admitting: Physician Assistant

## 2019-01-29 DIAGNOSIS — F411 Generalized anxiety disorder: Secondary | ICD-10-CM

## 2019-01-30 NOTE — Telephone Encounter (Signed)
Requested medication (s) are due for refill today: yes  Requested medication (s) are on the active medication list: yes  Last refill: 11/03/2018  Future visit scheduled: no  Notes to clinic:  Not delegated    Requested Prescriptions  Pending Prescriptions Disp Refills   ALPRAZolam (XANAX) 0.25 MG tablet [Pharmacy Med Name: ALPRAZOLAM 0.25 MG TABLET] 45 tablet 1    Sig: TAKE 1/2 TABLET BY MOUTH DAILY AS NEEDED     Not Delegated - Psychiatry:  Anxiolytics/Hypnotics Failed - 01/29/2019 10:27 AM      Failed - This refill cannot be delegated      Failed - Urine Drug Screen completed in last 360 days.      Failed - Valid encounter within last 6 months    Recent Outpatient Visits          10 months ago Acute non-recurrent pansinusitis   [facility] [place], [place]   12 months ago Annual physical exam   [place], [place], [place]   1 year ago Iron deficiency anemia secondary to inadequate dietary iron intake   [facility], [place], [place]   2 years ago Annual physical exam   [facility] [place], [place]   2 years ago Chronic bilateral low back pain with left-sided sciatica   [place], PA-C              SYNTHROID 125 MCG tablet [Pharmacy Med Name: SYNTHROID 125 MCG TABLET] 90 tablet 1    Sig: TAKE 1 TABLET BY MOUTH EVERY DAY     Endocrinology:  Hypothyroid Agents Failed - 01/29/2019 10:27 AM      Failed - TSH needs to be rechecked within 3 months after an abnormal result. Refill until TSH is due.      Failed - TSH in normal range and within 360 days    TSH  Date Value Ref Range Status  02/18/2017 2.56 0.40 - 4.50 mIU/L Final         Passed - Valid encounter within last 12 months    Recent Outpatient Visits          10 months ago Acute non-recurrent pansinusitis   [facility] [place], [place]   12 months  ago Annual physical exam   [place], [place], [place]   1 year ago Iron deficiency anemia secondary to inadequate dietary iron intake   [facility], [place], [place]   2 years ago Annual physical exam   [place], [place]   2 years ago Chronic bilateral low back pain with left-sided sciatica   [facility], [place], [place]

## 2019-01-30 NOTE — Telephone Encounter (Signed)
Patient due for CPE 

## 2019-01-31 DIAGNOSIS — N83202 Unspecified ovarian cyst, left side: Secondary | ICD-10-CM | POA: Diagnosis not present

## 2019-02-09 DIAGNOSIS — H353211 Exudative age-related macular degeneration, right eye, with active choroidal neovascularization: Secondary | ICD-10-CM | POA: Diagnosis not present

## 2019-02-10 ENCOUNTER — Other Ambulatory Visit: Payer: Self-pay | Admitting: Physician Assistant

## 2019-02-10 DIAGNOSIS — M62838 Other muscle spasm: Secondary | ICD-10-CM

## 2019-02-10 NOTE — Telephone Encounter (Signed)
Requested medication (s) are due for refill today: yes  Requested medication (s) are on the active medication list: yes  Last refill:  01/18/2019  Future visit scheduled: no  Notes to clinic:  refill cannot be delegated    Requested Prescriptions  Pending Prescriptions Disp Refills   tiZANidine (ZANAFLEX) 4 MG tablet [Pharmacy Med Name: TIZANIDINE HCL 4 MG TABLET] 90 tablet 1    Sig: TAKE 0.5-1 TABLETS (2-4 MG TOTAL) BY MOUTH EVERY 6 (SIX) HOURS AS NEEDED FOR MUSCLE SPASMS.      Not Delegated - Cardiovascular:  Alpha-2 Agonists - tizanidine Failed - 02/10/2019  3:29 AM      Failed - This refill cannot be delegated      Failed - Valid encounter within last 6 months    Recent Outpatient Visits           10 months ago Acute non-recurrent pansinusitis   [facility], [place], [place]   1 year ago Annual physical exam   [place], [place], [place]   1 year ago Iron deficiency anemia secondary to inadequate dietary iron intake   [facility], [place], [place]   2 years ago Annual physical exam   [facility] [place], [place]   2 years ago Chronic bilateral low back pain with left-sided sciatica   [facility], [place], [place]

## 2019-02-24 DIAGNOSIS — N83202 Unspecified ovarian cyst, left side: Secondary | ICD-10-CM

## 2019-02-24 HISTORY — DX: Unspecified ovarian cyst, left side: N83.202

## 2019-03-30 DIAGNOSIS — H353211 Exudative age-related macular degeneration, right eye, with active choroidal neovascularization: Secondary | ICD-10-CM | POA: Diagnosis not present

## 2019-04-02 ENCOUNTER — Other Ambulatory Visit: Payer: Self-pay | Admitting: Physician Assistant

## 2019-04-02 DIAGNOSIS — M62838 Other muscle spasm: Secondary | ICD-10-CM

## 2019-04-02 NOTE — Telephone Encounter (Signed)
Requested medication (s) are due for refill today: yes  Requested medication (s) are on the active medication list: yes  Last refill:  02/10/19  Future visit scheduled: no  Notes to clinic:  medication not delegated to NT to refill   Requested Prescriptions  Pending Prescriptions Disp Refills   tiZANidine (ZANAFLEX) 4 MG tablet [Pharmacy Med Name: TIZANIDINE HCL 4 MG TABLET] 90 tablet 1    Sig: TAKE 0.5-1 TABLETS (2-4 MG TOTAL) BY MOUTH EVERY 6 (SIX) HOURS AS NEEDED FOR MUSCLE SPASMS.      Not Delegated - Cardiovascular:  Alpha-2 Agonists - tizanidine Failed - 04/02/2019 12:57 AM      Failed - This refill cannot be delegated      Failed - Valid encounter within last 6 months    Recent Outpatient Visits           1 year ago Acute non-recurrent pansinusitis   [facility] [place], [place], [place]   1 year ago Annual physical exam   [facility] [place], [place], [place]   1 year ago Iron deficiency anemia secondary to inadequate dietary iron intake   [facility], [place], [place]   2 years ago Annual physical exam   [facility] [place], [place]   2 years ago Chronic bilateral low back pain with left-sided sciatica   [facility], [place], [place]

## 2019-04-21 ENCOUNTER — Other Ambulatory Visit: Payer: Self-pay | Admitting: Physician Assistant

## 2019-04-21 DIAGNOSIS — G8929 Other chronic pain: Secondary | ICD-10-CM

## 2019-05-19 ENCOUNTER — Ambulatory Visit (INDEPENDENT_AMBULATORY_CARE_PROVIDER_SITE_OTHER): Payer: PPO | Admitting: Physician Assistant

## 2019-05-19 ENCOUNTER — Other Ambulatory Visit: Payer: Self-pay

## 2019-05-19 ENCOUNTER — Encounter: Payer: Self-pay | Admitting: Physician Assistant

## 2019-05-19 VITALS — BP 128/71 | HR 83 | Temp 96.9°F | Resp 16 | Wt 145.0 lb

## 2019-05-19 DIAGNOSIS — I10 Essential (primary) hypertension: Secondary | ICD-10-CM

## 2019-05-19 DIAGNOSIS — L608 Other nail disorders: Secondary | ICD-10-CM

## 2019-05-19 DIAGNOSIS — M5442 Lumbago with sciatica, left side: Secondary | ICD-10-CM | POA: Diagnosis not present

## 2019-05-19 DIAGNOSIS — G8929 Other chronic pain: Secondary | ICD-10-CM | POA: Diagnosis not present

## 2019-05-19 DIAGNOSIS — Z0000 Encounter for general adult medical examination without abnormal findings: Secondary | ICD-10-CM

## 2019-05-19 DIAGNOSIS — Z1239 Encounter for other screening for malignant neoplasm of breast: Secondary | ICD-10-CM | POA: Diagnosis not present

## 2019-05-19 DIAGNOSIS — E034 Atrophy of thyroid (acquired): Secondary | ICD-10-CM

## 2019-05-19 MED ORDER — MELOXICAM 7.5 MG PO TABS: 7.5000 mg | ORAL_TABLET | Freq: Every day | ORAL | 1 refills | Status: DC | PRN

## 2019-05-19 NOTE — Progress Notes (Signed)
Patient: Jamie [name] Female    DOB: [birth year]   79 y.o.   MRN: [id number] Visit Date: 05/19/2019  Today's Provider: Margaretann Loveless, PA-C   No chief complaint on file.  Subjective:     HPI  Jamie [name] is a 79 yr old female that presents today for her annual physical exam and medication refill. She reports she is feeling well. She exercises occasionally. Sleeping well. Received her second covid vaccine today.    Hypothyroid, follow-up:  TSH  Date Value Ref Range Status  02/18/2017 2.56 0.40 - 4.50 mIU/L Final  02/10/2016 2.890 0.450 - 4.500 uIU/mL Final  11/20/2014 0.227 (L) 0.450 - 4.500 uIU/mL Final   Wt Readings from Last 3 Encounters:  05/19/19 145 lb (65.8 kg)  03/25/18 142 lb (64.4 kg)  02/02/18 142 lb (64.4 kg)   She reports excellent compliance with treatment. She is not having side effects.  She is exercising. She is experiencing heat / cold intolerance and brittle nails and hair falls out She denies change in energy level, diarrhea, nervousness, palpitations and weight changes Weight trend: stable  ------------------------------------------------------------------------   Wt Readings from Last 3 Encounters:  05/19/19 145 lb (65.8 kg)  03/25/18 142 lb (64.4 kg)  02/02/18 142 lb (64.4 kg)   BP Readings from Last 3 Encounters:  05/19/19 128/71  03/25/18 138/80  02/02/18 (!) 142/75    Allergies  Allergen Reactions  . Codeine   . Penicillins Rash     Current Outpatient Medications:  .  ALPRAZolam (XANAX) 0.25 MG tablet, TAKE 1/2 TABLET BY MOUTH DAILY AS NEEDED, Disp: 45 tablet, Rfl: 1 .  Calcium Carbonate (CALCIUM 600 PO), Take 1 tablet by mouth daily. , Disp: , Rfl:  .  esomeprazole (NEXIUM) 40 MG capsule, Take 40 mg by mouth daily at 12 noon. , Disp: , Rfl:  .  estradiol (ESTRACE) 0.5 MG tablet, TAKE 1 TABLET BY MOUTH EVERY DAY, Disp: 90 tablet, Rfl: 2 .  meloxicam (MOBIC) 7.5 MG tablet, TAKE 1 TABLET BY MOUTH EVERY DAY AS  NEEDED, Disp: 90 tablet, Rfl: 1 .  MULTIPLE VITAMIN PO, Take 1 tablet by mouth daily. , Disp: , Rfl:  .  nortriptyline (PAMELOR) 50 MG capsule, TAKE 1 CAPSULE (50 MG TOTAL) BY MOUTH DAILY., Disp: 90 capsule, Rfl: 3 .  SYNTHROID 125 MCG tablet, TAKE 1 TABLET BY MOUTH EVERY DAY, Disp: 90 tablet, Rfl: 1 .  tiZANidine (ZANAFLEX) 4 MG tablet, TAKE 0.5-1 TABLETS (2-4 MG TOTAL) BY MOUTH EVERY 6 (SIX) HOURS AS NEEDED FOR MUSCLE SPASMS., Disp: 90 tablet, Rfl: 1 .  triamterene-hydrochlorothiazide (MAXZIDE-25) 37.5-25 MG tablet, TAKE 1/2 TABLET BY MOUTH EVERYDAY AS NEEDED, Disp: 45 tablet, Rfl: 2 .  Vitamin D, Cholecalciferol, 1000 UNITS CAPS, Take 1 capsule by mouth daily. , Disp: , Rfl:  .  vitamin E 400 UNIT capsule, Take 400 Units by mouth daily., Disp: , Rfl:  .  azithromycin (ZITHROMAX) 250 MG tablet, Take 2 tablets PO on day one, and one tablet PO daily thereafter until completed., Disp: 6 tablet, Rfl: 0 .  benzonatate (TESSALON) 200 MG capsule, Take 1 capsule (200 mg total) by mouth 3 (three) times daily as needed for cough., Disp: 30 capsule, Rfl: 0 .  bumetanide (BUMEX) 0.5 MG tablet, Take 1 tablet (0.5 mg total) by mouth daily., Disp: 90 tablet, Rfl: 1 .  traMADol (ULTRAM) 50 MG tablet, Take 1 tablet (50 mg total) by mouth every 8 (eight) hours as  needed., Disp: 30 tablet, Rfl: 0  Review of Systems  Constitutional: Negative.   HENT: Negative.   Eyes: Negative.   Respiratory: Negative.   Cardiovascular: Negative.   Gastrointestinal: Negative.   Endocrine: Negative.   Genitourinary: Negative.   Musculoskeletal: Negative.   Skin: Negative.   Allergic/Immunologic: Negative.   Neurological: Negative.   Hematological: Negative.   Psychiatric/Behavioral: Negative.     Social History   Tobacco Use  . Smoking status: Never Smoker  . Smokeless tobacco: Never Used  Substance Use Topics  . Alcohol use: No      Objective:   BP 128/71 (BP Location: Left Arm, Patient Position: Sitting, Cuff  Size: Large)   Pulse 83   Temp (!) 96.9 F (36.1 C) (Temporal)   Resp 16   Wt 145 lb (65.8 kg)   BMI 23.40 kg/m  Vitals:   05/19/19 1634  BP: 128/71  Pulse: 83  Resp: 16  Temp: (!) 96.9 F (36.1 C)  TempSrc: Temporal  Weight: 145 lb (65.8 kg)  Body mass index is 23.4 kg/m.   Physical Exam Vitals reviewed.  Constitutional:      General: She is not in acute distress.    Appearance: Normal appearance. She is well-developed and normal weight. She is not ill-appearing or diaphoretic.  HENT:     Head: Normocephalic and atraumatic.     Right Ear: Tympanic membrane, ear canal and external ear normal.     Left Ear: Tympanic membrane, ear canal and external ear normal.  Eyes:     General: No scleral icterus.       Right eye: No discharge.        Left eye: No discharge.     Extraocular Movements: Extraocular movements intact.     Conjunctiva/sclera: Conjunctivae normal.     Pupils: Pupils are equal, round, and reactive to light.  Neck:     Thyroid: No thyromegaly.     Vascular: No carotid bruit or JVD.     Trachea: No tracheal deviation.  Cardiovascular:     Rate and Rhythm: Normal rate and regular rhythm.     Pulses: Normal pulses.     Heart sounds: Normal heart sounds. No murmur. No friction rub. No gallop.   Pulmonary:     Effort: Pulmonary effort is normal. No respiratory distress.     Breath sounds: Normal breath sounds. No wheezing or rales.  Chest:     Chest wall: No tenderness.  Abdominal:     General: Abdomen is flat. Bowel sounds are normal. There is no distension.     Palpations: Abdomen is soft. There is no mass.     Tenderness: There is no abdominal tenderness. There is no guarding or rebound.  Musculoskeletal:        General: No tenderness. Normal range of motion.     Cervical back: Normal range of motion and neck supple.     Right lower leg: No edema.     Left lower leg: No edema.  Lymphadenopathy:     Cervical: No cervical adenopathy.  Skin:     General: Skin is warm and dry.     Capillary Refill: Capillary refill takes less than 2 seconds.     Findings: No rash.     Comments: Flattening and ridges of nails, nails more brittle than normal.   Neurological:     General: No focal deficit present.     Mental Status: She is alert and oriented to person, place, and time.  Mental status is at baseline.  Psychiatric:        Mood and Affect: Mood normal.        Behavior: Behavior normal.        Thought Content: Thought content normal.        Judgment: Judgment normal.      No results found for any visits on 05/19/19.     Assessment & Plan    1. Annual physical exam Normal exam. Up to date on screenings and vaccinations.   2. Encounter for breast cancer screening using non-mammogram modality Breast exam today was normal. There is no family history of breast cancer. She does perform regular self breast exams. Mammogram was ordered as below. Information for [facility] was given to patient so she may schedule her mammogram at her convenience. - MM 3D SCREEN BREAST BILATERAL; Future  3. Chronic bilateral low back pain with left-sided sciatica Stable. Diagnosis pulled for medication refill. Continue current medical treatment plan. Will check labs as below and f/u pending results. - CBC w/Diff/Platelet - Comprehensive Metabolic Panel (CMET) - meloxicam (MOBIC) 7.5 MG tablet; Take 1 tablet (7.5 mg total) by mouth daily as needed.  Dispense: 90 tablet; Refill: 1  4. Essential (primary) hypertension Stable. Continue Maxzide 37.5-25mg  daily. Will check labs as below and f/u pending results. - CBC w/Diff/Platelet - Comprehensive Metabolic Panel (CMET) - Lipid Panel With LDL/HDL Ratio  5. Hypothyroidism due to acquired atrophy of thyroid Stable on Synthroid (brand) . Will check labs as below and f/u pending results. - CBC w/Diff/Platelet - Comprehensive Metabolic Panel (CMET) - T4 AND TSH  6. Change in nail  appearance Over the last few months has noticed changes in the texture and shape of her nails. Will check labs as below to r/o vit deficiency and f/u pending results. - T4 AND TSH - Vitamin D (25 hydroxy) - Fe+TIBC+Fer     Margaretann Loveless, PA-C  [facility]

## 2019-05-19 NOTE — Patient Instructions (Signed)
[facility] [address] [place],  [place]  [address] Main: [phone]   Health Maintenance After Age 78 After age 78, you are at a higher risk for certain long-term diseases and infections as well as injuries from falls. Falls are a major cause of broken bones and head injuries in people who are older than age 78. Getting regular preventive care can help to keep you healthy and well. Preventive care includes getting regular testing and making lifestyle changes as recommended by your health care provider. Talk with your health care provider about:  Which screenings and tests you should have. A screening is a test that checks for a disease when you have no symptoms.  A diet and exercise plan that is right for you. What should I know about screenings and tests to prevent falls? Screening and testing are the best ways to find a health problem early. Early diagnosis and treatment give you the best chance of managing medical conditions that are common after age 78. Certain conditions and lifestyle choices may make you more likely to have a fall. Your health care provider may recommend:  Regular vision checks. Poor vision and conditions such as cataracts can make you more likely to have a fall. If you wear glasses, make sure to get your prescription updated if your vision changes.  Medicine review. Work with your health care provider to regularly review all of the medicines you are taking, including over-the-counter medicines. Ask your health care provider about any side effects that may make you more likely to have a fall. Tell your health care provider if any medicines that you take make you feel dizzy or sleepy.  Osteoporosis screening. Osteoporosis is a condition that causes the bones to get weaker. This can make the bones weak and cause them to break more easily.  Blood pressure screening. Blood pressure changes and medicines to control blood pressure can  make you feel dizzy.  Strength and balance checks. Your health care provider may recommend certain tests to check your strength and balance while standing, walking, or changing positions.  Foot health exam. Foot pain and numbness, as well as not wearing proper footwear, can make you more likely to have a fall.  Depression screening. You may be more likely to have a fall if you have a fear of falling, feel emotionally low, or feel unable to do activities that you used to do.  Alcohol use screening. Using too much alcohol can affect your balance and may make you more likely to have a fall. What actions can I take to lower my risk of falls? General instructions  Talk with your health care provider about your risks for falling. Tell your health care provider if: ? You fall. Be sure to tell your health care provider about all falls, even ones that seem minor. ? You feel dizzy, sleepy, or off-balance.  Take over-the-counter and prescription medicines only as told by your health care provider. These include any supplements.  Eat a healthy diet and maintain a healthy weight. A healthy diet includes low-fat dairy products, low-fat (lean) meats, and fiber from whole grains, beans, and lots of fruits and vegetables. Home safety  Remove any tripping hazards, such as rugs, cords, and clutter.  Install safety equipment such as grab bars in bathrooms and safety rails on stairs.  Keep rooms and walkways well-lit. Activity   Follow a regular exercise program to stay fit. This will help you maintain your balance.   Ask your health care provider what types of exercise are appropriate for you.  If you need a cane or walker, use it as recommended by your health care provider.  Wear supportive shoes that have nonskid soles. Lifestyle  Do not drink alcohol if your health care provider tells you not to drink.  If you drink alcohol, limit how much you have: ? 0-1 drink a day for women. ? 0-2 drinks a  day for men.  Be aware of how much alcohol is in your drink. In the [place] one drink equals one typical bottle of beer (12 oz), one-half glass of wine (5 oz), or one shot of hard liquor (1 oz).  Do not use any products that contain nicotine or tobacco, such as cigarettes and e-cigarettes. If you need help quitting, ask your health care provider. Summary  Having a healthy lifestyle and getting preventive care can help to protect your health and wellness after age 78.  Screening and testing are the best way to find a health problem early and help you avoid having a fall. Early diagnosis and treatment give you the best chance for managing medical conditions that are more common for people who are older than age 78.  Falls are a major cause of broken bones and head injuries in people who are older than age 78. Take precautions to prevent a fall at home.  Work with your health care provider to learn what changes you can make to improve your health and wellness and to prevent falls. This information is not intended to replace advice given to you by your health care provider. Make sure you discuss any questions you have with your health care provider. Document Revised: 06/02/2018 Document Reviewed: 12/23/2016 Elsevier Patient Education  2020 Elsevier Inc.  

## 2019-05-25 ENCOUNTER — Telehealth: Payer: Self-pay | Admitting: Physician Assistant

## 2019-05-25 NOTE — Telephone Encounter (Signed)
Left message for patient to schedule Annual Wellness Visit.  Please schedule with Nurse Health Advisor Victoria Britt, RN at [facility] [place]  

## 2019-05-31 DIAGNOSIS — H353211 Exudative age-related macular degeneration, right eye, with active choroidal neovascularization: Secondary | ICD-10-CM | POA: Diagnosis not present

## 2019-05-31 DIAGNOSIS — H353122 Nonexudative age-related macular degeneration, left eye, intermediate dry stage: Secondary | ICD-10-CM | POA: Diagnosis not present

## 2019-05-31 DIAGNOSIS — H43823 Vitreomacular adhesion, bilateral: Secondary | ICD-10-CM | POA: Diagnosis not present

## 2019-06-08 DIAGNOSIS — G8929 Other chronic pain: Secondary | ICD-10-CM | POA: Diagnosis not present

## 2019-06-08 DIAGNOSIS — I10 Essential (primary) hypertension: Secondary | ICD-10-CM | POA: Diagnosis not present

## 2019-06-08 DIAGNOSIS — E034 Atrophy of thyroid (acquired): Secondary | ICD-10-CM | POA: Diagnosis not present

## 2019-06-08 DIAGNOSIS — M5442 Lumbago with sciatica, left side: Secondary | ICD-10-CM | POA: Diagnosis not present

## 2019-06-08 DIAGNOSIS — L608 Other nail disorders: Secondary | ICD-10-CM | POA: Diagnosis not present

## 2019-06-09 ENCOUNTER — Telehealth: Payer: Self-pay

## 2019-06-09 LAB — COMPREHENSIVE METABOLIC PANEL
ALT: 17 IU/L (ref 0–32)
AST: 23 IU/L (ref 0–40)
Albumin/Globulin Ratio: 1.8 (ref 1.2–2.2)
Albumin: 4.2 g/dL (ref 3.7–4.7)
Alkaline Phosphatase: 52 IU/L (ref 39–117)
BUN/Creatinine Ratio: 14 (ref 12–28)
BUN: 12 mg/dL (ref 8–27)
Bilirubin Total: 0.2 mg/dL (ref 0.0–1.2)
CO2: 27 mmol/L (ref 20–29)
Calcium: 9.5 mg/dL (ref 8.7–10.3)
Chloride: 102 mmol/L (ref 96–106)
Creatinine, Ser: 0.83 mg/dL (ref 0.57–1.00)
GFR calc Af Amer: 79 mL/min/{1.73_m2} (ref >59)
GFR calc non Af Amer: 68 mL/min/{1.73_m2} (ref >59)
Globulin, Total: 2.4 g/dL (ref 1.5–4.5)
Glucose: 89 mg/dL (ref 65–99)
Potassium: 4.4 mmol/L (ref 3.5–5.2)
Sodium: 139 mmol/L (ref 134–144)
Total Protein: 6.6 g/dL (ref 6.0–8.5)

## 2019-06-09 LAB — CBC WITH DIFFERENTIAL/PLATELET
Basophils Absolute: 0 10*3/uL (ref 0.0–0.2)
Basos: 1 %
EOS (ABSOLUTE): 0.4 10*3/uL (ref 0.0–0.4)
Eos: 7 %
Hematocrit: 40.5 % (ref 34.0–46.6)
Hemoglobin: 13.4 g/dL (ref 11.1–15.9)
Immature Grans (Abs): 0 10*3/uL (ref 0.0–0.1)
Immature Granulocytes: 0 %
Lymphocytes Absolute: 1.6 10*3/uL (ref 0.7–3.1)
Lymphs: 24 %
MCH: 30.9 pg (ref 26.6–33.0)
MCHC: 33.1 g/dL (ref 31.5–35.7)
MCV: 94 fL (ref 79–97)
Monocytes Absolute: 0.7 10*3/uL (ref 0.1–0.9)
Monocytes: 10 %
Neutrophils Absolute: 3.9 10*3/uL (ref 1.4–7.0)
Neutrophils: 58 %
Platelets: 289 10*3/uL (ref 150–450)
RBC: 4.33 x10E6/uL (ref 3.77–5.28)
RDW: 12.2 % (ref 11.7–15.4)
WBC: 6.6 10*3/uL (ref 3.4–10.8)

## 2019-06-09 LAB — LIPID PANEL WITH LDL/HDL RATIO
Cholesterol, Total: 185 mg/dL (ref 100–199)
HDL: 75 mg/dL (ref >39)
LDL Chol Calc (NIH): 86 mg/dL (ref 0–99)
LDL/HDL Ratio: 1.1 ratio (ref 0.0–3.2)
Triglycerides: 142 mg/dL (ref 0–149)
VLDL Cholesterol Cal: 24 mg/dL (ref 5–40)

## 2019-06-09 LAB — IRON,TIBC AND FERRITIN PANEL
Ferritin: 25 ng/mL (ref 15–150)
Iron Saturation: 19 % (ref 15–55)
Iron: 68 ug/dL (ref 27–139)
Total Iron Binding Capacity: 360 ug/dL (ref 250–450)
UIBC: 292 ug/dL (ref 118–369)

## 2019-06-09 LAB — T4 AND TSH
T4, Total: 5.9 ug/dL (ref 4.5–12.0)
TSH: 3.76 u[IU]/mL (ref 0.450–4.500)

## 2019-06-09 LAB — VITAMIN D 25 HYDROXY (VIT D DEFICIENCY, FRACTURES): Vit D, 25-Hydroxy: 34.7 ng/mL (ref 30.0–100.0)

## 2019-06-09 NOTE — Telephone Encounter (Signed)
Patient advised.

## 2019-06-09 NOTE — Telephone Encounter (Signed)
-----   Message from Margaretann Loveless, [place] sent at 06/09/2019  8:10 AM EDT ----- Blood count is normal. Kidney and liver function is normal. Sodium, potassium, and calcium are normal. Thyroid is normal. Cholesterol is normal. Vit D is normal. Iron levels are normal.

## 2019-06-09 NOTE — Telephone Encounter (Signed)
Patient returning call to Joseline.

## 2019-06-09 NOTE — Telephone Encounter (Signed)
Call cannot be completed at this time. Will try later 

## 2019-06-14 ENCOUNTER — Other Ambulatory Visit: Payer: Self-pay | Admitting: Physician Assistant

## 2019-06-14 DIAGNOSIS — N959 Unspecified menopausal and perimenopausal disorder: Secondary | ICD-10-CM

## 2019-06-21 ENCOUNTER — Ambulatory Visit
Admission: RE | Admit: 2019-06-21 | Discharge: 2019-06-21 | Disposition: A | Discharge status: Home / Self Care | Payer: PPO | Source: Ambulatory Visit | Attending: Physician Assistant | Admitting: Physician Assistant

## 2019-06-21 DIAGNOSIS — Z1231 Encounter for screening mammogram for malignant neoplasm of breast: Secondary | ICD-10-CM | POA: Insufficient documentation

## 2019-06-21 DIAGNOSIS — Z1239 Encounter for other screening for malignant neoplasm of breast: Secondary | ICD-10-CM

## 2019-08-01 DIAGNOSIS — N83202 Unspecified ovarian cyst, left side: Secondary | ICD-10-CM | POA: Diagnosis not present

## 2019-08-06 ENCOUNTER — Other Ambulatory Visit: Payer: Self-pay | Admitting: Physician Assistant

## 2019-08-06 DIAGNOSIS — F411 Generalized anxiety disorder: Secondary | ICD-10-CM

## 2019-08-07 ENCOUNTER — Telehealth: Payer: Self-pay

## 2019-08-07 DIAGNOSIS — N83292 Other ovarian cyst, left side: Secondary | ICD-10-CM | POA: Diagnosis not present

## 2019-08-07 NOTE — Telephone Encounter (Signed)
Requested medication (s) are due for refill today: yes  Requested medication (s) are on the active medication list: yes  Last refill:  05/07/2019  Future visit scheduled: no  Notes to clinic:   This refill cannot be delegated  Requested Prescriptions  Pending Prescriptions Disp Refills   ALPRAZolam (XANAX) 0.25 MG tablet [Pharmacy Med Name: ALPRAZOLAM 0.25 MG TABLET] 45 tablet     Sig: TAKE 1/2 TABLET BY MOUTH DAILY AS NEEDED      Not Delegated - Psychiatry:  Anxiolytics/Hypnotics Failed - 08/06/2019  7:34 PM      Failed - This refill cannot be delegated      Failed - Urine Drug Screen completed in last 360 days.      Passed - Valid encounter within last 6 months    Recent Outpatient Visits           2 months ago Annual physical exam   [facility] [place], Alessandra Bevels, [place]   1 year ago Acute non-recurrent pansinusitis   [facility] [place], Alessandra Bevels, [place]   1 year ago Annual physical exam   [facility] [place], [place], [place]   1 year ago Iron deficiency anemia secondary to inadequate dietary iron intake   [facility], Alessandra Bevel[place], [place]   2 years ago Annual physical exam   [facility] Margaretann Loveless, [place]

## 2019-08-07 NOTE — Telephone Encounter (Signed)
Patient was contacted and was scheduled for 08/30/2019 @ 10:40 AM w/ Antony Contras.

## 2019-08-07 NOTE — Telephone Encounter (Signed)
Ok to schedule in near future. Patient surgery is not until 09/08/19.

## 2019-08-07 NOTE — Telephone Encounter (Signed)
Patient needs appointment for surg clearance  Copied from CRM [id number]. Topic: General - Other >> Aug 07, 2019  4:01 PM Randol Kern wrote: Reason for CRM: Request for medical clearance, pt has surgery scheduled.  Best contact: [phone] Fax: [phone] [facility]

## 2019-08-09 DIAGNOSIS — H353211 Exudative age-related macular degeneration, right eye, with active choroidal neovascularization: Secondary | ICD-10-CM | POA: Diagnosis not present

## 2019-08-14 ENCOUNTER — Other Ambulatory Visit: Payer: Self-pay | Admitting: Obstetrics and Gynecology

## 2019-08-30 ENCOUNTER — Other Ambulatory Visit: Payer: PPO

## 2019-08-30 ENCOUNTER — Ambulatory Visit (INDEPENDENT_AMBULATORY_CARE_PROVIDER_SITE_OTHER): Payer: PPO | Admitting: Physician Assistant

## 2019-08-30 ENCOUNTER — Other Ambulatory Visit: Payer: Self-pay

## 2019-08-30 ENCOUNTER — Encounter: Payer: Self-pay | Admitting: Physician Assistant

## 2019-08-30 VITALS — BP 127/75 | HR 74 | Temp 98.4°F | Resp 16 | Wt 146.0 lb

## 2019-08-30 DIAGNOSIS — I10 Essential (primary) hypertension: Secondary | ICD-10-CM

## 2019-08-30 DIAGNOSIS — Z01818 Encounter for other preprocedural examination: Secondary | ICD-10-CM

## 2019-08-30 LAB — POCT URINALYSIS DIPSTICK
Bilirubin, UA: NEGATIVE
Blood, UA: NEGATIVE
Glucose, UA: NEGATIVE
Ketones, UA: NEGATIVE
Leukocytes, UA: NEGATIVE
Nitrite, UA: NEGATIVE
Protein, UA: NEGATIVE
Spec Grav, UA: 1.01 (ref 1.010–1.025)
Urobilinogen, UA: 0.2 E.U./dL
pH, UA: 6 (ref 5.0–8.0)

## 2019-08-30 NOTE — H&P (Signed)
Jamie [name] is a 79 y.o. female here for L/S BSO . Pt has been followed by me for a asymptomatic complex left ovarian cyst .  Recent labs show  An abnormal elevation in HE-4 level . I have recommend removal of both ovaries and tubes  Hysterectomy  No free fluid seen  Rt ovary appears wnl  Lt complex septated ovarian cyst=5.8cm; septations=0.64cm & 0.13cm; bright echogenic nodule within=0.49 x 0.36 x 0.39cm  Lt cyst volume=59.77ml   CA -125= 7.9  HE-4 = 102 ( upper normal 96)  Past Medical History:  has a past medical history of Colon polyp (12/2009), Esophageal stricture (2010), GERD (gastroesophageal reflux disease), History of chickenpox, Hypertension, Hypothyroidism, Macular degeneration, and Murmur, unspecified.  Past Surgical History:  has a past surgical history that includes Cholecystectomy; Left foot surgery; egd (01/03/2010); Colonoscopy (12/27/1999); Colonoscopy (01/03/2010); Hysterectomy (1980); [facility]; and Colonoscopy (03/22/2015). Family History: family history includes Emphysema in her father; Melanoma in her brother; Myocardial Infarction (Heart attack) in her father; Stroke in her mother. Social History:  reports that she has never smoked. She has never used smokeless tobacco. She reports that she does not drink alcohol and does not use drugs. OB/GYN History:          OB History    Gravida  2   Para  2   Term  2   Preterm      AB      Living  2     SAB      TAB      Ectopic      Molar      Multiple      Live Births             Allergies: is allergic to codeine sulfate and penicillin v potassium. Medications:  Current Outpatient Medications:  .  ALPRAZolam (XANAX) 0.25 MG tablet, Take 0.125 mg by mouth 2 (two) times daily as needed for Sleep. , Disp: , Rfl:  .  calcium carbonate-vitamin D3 (CALCIUM 600 + D,3,) 600 mg(1,500mg ) -400 unit tablet, Take 1 tablet by mouth once daily., Disp: , Rfl:  .  cetirizine (ZYRTEC) 10 mg capsule, Take  1 capsule (10 mg total) by mouth once daily., Disp: 30 capsule, Rfl: 0 .  esomeprazole (NEXIUM) 20 MG DR capsule, Take 40 mg by mouth once daily. , Disp: , Rfl:  .  meloxicam (MOBIC) 7.5 MG tablet, Take by mouth, Disp: , Rfl:  .  multivitamin tablet, Take 1 tablet by mouth once daily., Disp: , Rfl:  .  nortriptyline (PAMELOR) 50 MG capsule, Take 50 mg by mouth nightly., Disp: , Rfl:  .  SYNTHROID 125 mcg tablet, Take 125 mcg by mouth once daily.  , Disp: , Rfl:  .  meloxicam (MOBIC) 7.5 MG tablet, Take 1 tablet (7.5 mg total) by mouth once daily. (Patient not taking: Reported on 08/07/2019  ), Disp: 90 tablet, Rfl: 2 .  traMADol (ULTRAM) 50 mg tablet, Take by mouth every 6 (six) hours as needed.  , Disp: , Rfl:  .  triamterene-hydrochlorothiazide (DYAZIDE) 37.5-25 mg capsule, Take 0.5 capsules by mouth once daily.  , Disp: , Rfl:   Review of Systems: General:                      No fatigue or weight loss Eyes:  No vision changes Ears:                            No hearing difficulty Respiratory:                No cough or shortness of breath Pulmonary:                  No asthma or shortness of breath Cardiovascular:           No chest pain, palpitations, dyspnea on exertion Gastrointestinal:          No abdominal bloating, chronic diarrhea, constipations, masses, pain or hematochezia Genitourinary:             No hematuria, dysuria, abnormal vaginal discharge, pelvic pain, Menometrorrhagia Lymphatic:                   No swollen lymph nodes Musculoskeletal:         No muscle weakness Neurologic:                  No extremity weakness, syncope, seizure disorder Psychiatric:                  No history of depression, delusions or suicidal/homicidal ideation    Exam:      Vitals:   08/31/19 1502  BP: 140/67  Pulse: 84    Body mass index is 24.3 kg/m.  WDWN white/ female in NAD   Lungs: CTA  CV : RRR without murmur   Neck:  no thyromegaly Abdomen:  soft , no mass, normal active bowel sounds,  non-tender, no rebound tenderness Pelvic: tanner stage 5 ,  External genitalia: vulva /labia no lesions Urethra: no prolapse Vagina: normal physiologic d/c Cervix: no lesions, no cervical motion tenderness   Uterus: normal size shape and contour, non-tender Adnexa: no mass,  non-tender   Rectovaginal:  Impression:   The encounter diagnosis was Complex cyst of left ovary.  Now with abnornal HE-4 test , concern is for possible early development of ovarian cancer   Plan:   I have recommended L/S BSO  Reviewed the procedure with her  Benefits and risks to surgery: The proposed benefit of the surgery has been discussed with the patient. The possible risks include, but are not limited to: organ injury to the bowel , bladder, ureters, and major blood vessels and nerves. There is a possibility of additional surgeries resulting from these injuries. There is also the risk of blood transfusion and the need to receive blood products during or after the procedure which may rarely lead to HIV or Hepatitis C infection. There is a risk of developing a deep venous thrombosis or a pulmonary embolism . There is the possibility of wound infection and also anesthetic complications, even the rare possibility of death. The patient understands these risks and wishes to proceed. All questions have been answered and the consent has been signed.   recommend preop clearance with her PCP       Vilma Prader, MD

## 2019-08-30 NOTE — Progress Notes (Signed)
Established patient visit   Patient: Jamie [name]   DOB: 08-Feb-1941   79 y.o. Female  MRN: [id number] Visit Date: 08/30/2019  Today's healthcare provider: Margaretann Loveless, PA-C   Chief Complaint  Patient presents with  . Medical Clearance   Subjective     Subjective:  Jamie [name] is a 79 y.o. female who presents to the office today for a preoperative consultation at the request of surgeon Dr. Jennell Corner who plans on performing laparoscopic bilateral salpingo oophorectomy on July 16.   Patient fells generally well today. She has had general anesthesia in the past and has had no previous complications. Does NOT have dentures or partial implants.   Patient Active Problem List   Diagnosis Date Noted  . Cyst of ovary 05/03/2015  . Foot pain 11/22/2014  . Dysfunction of eustachian tube 11/20/2014  . Hypercholesteremia 11/20/2014  . Cardiac murmur 11/20/2014  . Temporary cerebral vascular dysfunction 11/20/2014  . Gout attack 11/20/2014  . Shortness of breath 11/20/2014  . Essential (primary) hypertension 11/20/2014  . L-S radiculopathy 09/27/2014  . Migraine 07/31/2014  . Hypothyroidism 07/31/2014  . Colon polyp 12/24/2009  . LBP (low back pain) 06/28/2009  . Anxiety state 10/26/2008  . Esophageal stenosis 10/26/2008  . Hemorrhoids, internal 10/26/2008  . Menopausal and perimenopausal disorder 10/26/2008  . Gastro-esophageal reflux disease without esophagitis 10/26/2008  . Edema 10/26/2008  . Allergic rhinitis 07/13/2006   Past Medical History:  Diagnosis Date  . Anemia   . Anxiety   . Arthritis   . GERD (gastroesophageal reflux disease)   . Headache   . Hypertension   . Hypothyroidism   . Macular degeneration        Medications: Outpatient Medications Prior to Visit  Medication Sig  . Acetaminophen-Caffeine (TENSION HEADACHE) 500-65 MG TABS Take 2 tablets by mouth daily as needed (headaches).  . ALPRAZolam (XANAX) 0.25 MG tablet TAKE  1/2 TABLET BY MOUTH DAILY AS NEEDED (Patient taking differently: Take 0.125 mg by mouth at bedtime. )  . b complex vitamins tablet Take 1 tablet by mouth daily.  . cetirizine (ZYRTEC) 10 MG tablet Take 10 mg by mouth daily.  . Cholecalciferol (VITAMIN D) 50 MCG (2000 UT) tablet Take 2,000 Units by mouth daily.  Marland Kitchen esomeprazole (NEXIUM) 20 MG capsule Take 20 mg by mouth daily.  Marland Kitchen estradiol (ESTRACE) 0.5 MG tablet TAKE 1 TABLET BY MOUTH EVERY DAY (Patient taking differently: Take 0.5 mg by mouth daily. )  . meloxicam (MOBIC) 7.5 MG tablet Take 1 tablet (7.5 mg total) by mouth daily as needed. (Patient taking differently: Take 7.5 mg by mouth daily as needed for pain. )  . MULTIPLE VITAMIN PO Take 1 tablet by mouth daily.   . nortriptyline (PAMELOR) 50 MG capsule TAKE 1 CAPSULE BY MOUTH EVERY DAY (Patient taking differently: Take 50 mg by mouth at bedtime. )  . Polyvinyl Alcohol-Povidone (REFRESH OP) Place 1 drop into both eyes daily as needed (dry eyes).  . SYNTHROID 125 MCG tablet TAKE 1 TABLET BY MOUTH EVERY DAY (Patient taking differently: Take 125 mcg by mouth daily. )  . tiZANidine (ZANAFLEX) 4 MG tablet TAKE 0.5-1 TABLETS (2-4 MG TOTAL) BY MOUTH EVERY 6 (SIX) HOURS AS NEEDED FOR MUSCLE SPASMS.  . vitamin E 1000 UNIT capsule Take 1,000 Units by mouth daily.  Marland Kitchen triamterene-hydrochlorothiazide (MAXZIDE-25) 37.5-25 MG tablet TAKE 1/2 TABLET BY MOUTH EVERYDAY AS NEEDED (Patient not taking: Reported on 08/21/2019)   No facility-administered medications  prior to visit.    Review of Systems  Constitutional: Negative for appetite change, chills, fatigue and fever.  Respiratory: Negative for chest tightness and shortness of breath.   Cardiovascular: Negative for chest pain and palpitations.  Gastrointestinal: Negative for abdominal pain, nausea and vomiting.  Neurological: Negative for dizziness, weakness, light-headedness, numbness and headaches.    Last CBC Lab Results  Component Value Date    WBC 6.6 06/08/2019   HGB 13.4 06/08/2019   HCT 40.5 06/08/2019   MCV 94 06/08/2019   MCH 30.9 06/08/2019   RDW 12.2 06/08/2019   PLT 289 06/08/2019   Last metabolic panel Lab Results  Component Value Date   GLUCOSE 89 06/08/2019   NA 139 06/08/2019   K 4.4 06/08/2019   CL 102 06/08/2019   CO2 27 06/08/2019   BUN 12 06/08/2019   CREATININE 0.83 06/08/2019   GFRNONAA 68 06/08/2019   GFRAA 79 06/08/2019   CALCIUM 9.5 06/08/2019   PROT 6.6 06/08/2019   ALBUMIN 4.2 06/08/2019   LABGLOB 2.4 06/08/2019   AGRATIO 1.8 06/08/2019   BILITOT 0.2 06/08/2019   ALKPHOS 52 06/08/2019   AST 23 06/08/2019   ALT 17 06/08/2019      Objective    BP 127/75 (BP Location: Left Arm, Patient Position: Sitting, Cuff Size: Normal)   Pulse 74   Temp 98.4 F (36.9 C) (Oral)   Resp 16   Wt 146 lb (66.2 kg)   BMI 23.57 kg/m  BP Readings from Last 3 Encounters:  08/30/19 127/75  05/19/19 128/71  03/25/18 138/80   Wt Readings from Last 3 Encounters:  08/30/19 146 lb (66.2 kg)  05/19/19 145 lb (65.8 kg)  03/25/18 142 lb (64.4 kg)      Physical Exam Vitals reviewed.  Constitutional:      General: She is not in acute distress.    Appearance: Normal appearance. She is well-developed. She is not ill-appearing or diaphoretic.  HENT:     Head: Normocephalic and atraumatic.     Right Ear: Tympanic membrane, ear canal and external ear normal.     Left Ear: Tympanic membrane, ear canal and external ear normal.  Eyes:     General: No scleral icterus.       Right eye: No discharge.        Left eye: No discharge.     Extraocular Movements: Extraocular movements intact.     Conjunctiva/sclera: Conjunctivae normal.     Pupils: Pupils are equal, round, and reactive to light.  Neck:     Thyroid: No thyromegaly.     Vascular: No JVD.     Trachea: No tracheal deviation.  Cardiovascular:     Rate and Rhythm: Normal rate and regular rhythm.     Pulses: Normal pulses.     Heart sounds: Normal  heart sounds. No murmur heard.  No friction rub. No gallop.   Pulmonary:     Effort: Pulmonary effort is normal. No respiratory distress.     Breath sounds: Normal breath sounds. No wheezing or rales.  Chest:     Chest wall: No tenderness.  Abdominal:     General: Abdomen is flat. Bowel sounds are normal. There is no distension.     Palpations: Abdomen is soft. There is no mass.     Tenderness: There is no abdominal tenderness. There is no guarding or rebound.  Musculoskeletal:        General: No tenderness. Normal range of motion.  Cervical back: Normal range of motion and neck supple.     Right lower leg: No edema.     Left lower leg: No edema.  Lymphadenopathy:     Cervical: No cervical adenopathy.  Skin:    General: Skin is warm and dry.     Capillary Refill: Capillary refill takes less than 2 seconds.     Findings: No rash.  Neurological:     General: No focal deficit present.     Mental Status: She is alert and oriented to person, place, and time.  Psychiatric:        Mood and Affect: Mood normal.        Behavior: Behavior normal.        Thought Content: Thought content normal.        Judgment: Judgment normal.      Results for orders placed or performed in visit on 08/30/19  POCT Urinalysis Dipstick  Result Value Ref Range   Color, UA yellow    Clarity, UA clear    Glucose, UA Negative Negative   Bilirubin, UA negative    Ketones, UA negative    Spec Grav, UA 1.010 1.010 - 1.025   Blood, UA negative    pH, UA 6.0 5.0 - 8.0   Protein, UA Negative Negative   Urobilinogen, UA 0.2 0.2 or 1.0 E.U./dL   Nitrite, UA negative    Leukocytes, UA Negative Negative   Appearance     Odor      Assessment & Plan     1. Encounter for preoperative examination for general surgical procedure UA normal. EKG today shows NSR rate of 71 with no ST segment changes personally reviewed by me. Labs from 05/2019 were completely normal. Chales Abrahams cardiac risk calculator states patient  is low risk for cardiac issue during and post operatively with a score of 0.2%. She is also low risk for post op pneumonia using the [facility] pneumonia category with a risk of 0.4%. Overall patient is considered low risk.  - POCT Urinalysis Dipstick  2. Essential (primary) hypertension See above medical treatment plan. - EKG 12-Lead   No follow-ups on file.      Delmer Islam, PA-C, have reviewed all documentation for this visit. The documentation on 08/30/19 for the exam, diagnosis, procedures, and orders are all accurate and complete.   Reine Just  [facility] [phone] (phone) [phone] (fax)  [facility]

## 2019-08-31 ENCOUNTER — Other Ambulatory Visit: Payer: Self-pay

## 2019-08-31 ENCOUNTER — Encounter
Admission: RE | Admit: 2019-08-31 | Discharge: 2019-08-31 | Disposition: A | Discharge status: Home / Self Care | Payer: PPO | Source: Ambulatory Visit | Attending: Obstetrics and Gynecology | Admitting: Obstetrics and Gynecology

## 2019-08-31 HISTORY — DX: Other specified postprocedural states: Z98.890

## 2019-08-31 HISTORY — DX: Nausea with vomiting, unspecified: R11.2

## 2019-08-31 NOTE — Patient Instructions (Addendum)
COVID TESTING Date: Wednesday, July 14 Testing site:  [facility] [address] Thru Hours:  5:46 am - 1:00 pm Once you are tested, you are asked to stay quarantined (avoiding public places) until after your surgery.   Your procedure is scheduled on: Friday, July 16 Report to Day Surgery on the 2nd floor of the [facility] To find out your arrival time, please call [phone] between 1PM - 3PM on: Thursday, July 15  REMEMBER: Instructions that are not followed completely may result in serious medical risk, up to and including death; or upon the discretion of your surgeon and anesthesiologist your surgery may need to be rescheduled.  Do not eat food after midnight the night before surgery.  No gum chewing, lozengers or hard candies.  You may however, drink CLEAR liquids up to 2 hours before you are scheduled to arrive for your surgery. Do not drink anything within 2 hours of your scheduled arrival time.  Clear liquids include: - water  - apple juice without pulp - gatorade (not RED) - black coffee or tea (Do NOT add milk or creamers to the coffee or tea) Do NOT drink anything that is not on this list.  ENSURE PRE-SURGERY CARBOHYDRATE DRINK:  Complete drinking 2 hours prior to scheduled arrival time.  TAKE THESE MEDICATIONS THE MORNING OF SURGERY WITH A SIP OF WATER:  1.  Nexium - (take one the night before and one on the morning of surgery - helps to prevent nausea after surgery.) 2.  Synthroid  Stop Anti-inflammatories (NSAIDS) such as MELOXICAM, Advil, Aleve, Ibuprofen, Motrin, Naproxen, Naprosyn and Aspirin based products such as Excedrin, Goodys Powder, BC Powder. (May take Tylenol or Acetaminophen if needed.)  Stop ANY OVER THE COUNTER supplements until after surgery. STOP PRESERVISION AREDS, VITAMIN E (May continue Vitamin D, Vitamin B, and multivitamin.)  On the morning of surgery brush your teeth with toothpaste and water, you may  rinse your mouth with mouthwash if you wish. Do not swallow any toothpaste or mouthwash.  Do not wear jewelry, make-up, hairpins, clips or nail polish.  Do not wear lotions, powders, or perfumes.   Do not shave 48 hours prior to surgery.   Do not bring valuables to the hospital. [facility] is not responsible for any missing/lost belongings or valuables.   Use CHG Soap as directed on instruction sheet.  Notify your doctor if there is any change in your medical condition (cold, fever, infection).  Wear comfortable clothing (specific to your surgery type) to the hospital.  Plan for stool softeners for home use; pain medications have a tendency to cause constipation. You can also help prevent constipation by eating foods high in fiber such as fruits and vegetables and drinking plenty of fluids as your diet allows.  After surgery, you can help prevent lung complications by doing breathing exercises.  Take deep breaths and cough every 1-2 hours. Your doctor may order a device called an Incentive Spirometer to help you take deep breaths. When coughing or sneezing, hold a pillow firmly against your incision with both hands. This is called "splinting." Doing this helps protect your incision. It also decreases belly discomfort.  If you are being discharged the day of surgery, you will not be allowed to drive home. You will need a responsible adult (18 years or older) to drive you home and stay with you that night.   If you are taking public transportation, you will need to have a responsible adult (  18 years or older) with you. Please confirm with your physician that it is acceptable to use public transportation.   Please call the Pre-admissions Testing Dept. at [phone] if you have any questions about these instructions.  Visitation Policy:  Patients undergoing a surgery or procedure may have one family member or support person with them as long as that person is not COVID-19 positive  or experiencing its symptoms.  That person may remain in the waiting area during the procedure.  As a reminder, masks are still required for all [facility] team members, patients and visitors in all [facility] facilities.   Systemwide, no visitors 17 or younger.

## 2019-09-06 ENCOUNTER — Other Ambulatory Visit: Payer: Self-pay

## 2019-09-06 ENCOUNTER — Other Ambulatory Visit
Admission: RE | Admit: 2019-09-06 | Discharge: 2019-09-06 | Disposition: A | Discharge status: Home / Self Care | Payer: PPO | Source: Ambulatory Visit | Attending: Obstetrics and Gynecology | Admitting: Obstetrics and Gynecology

## 2019-09-06 DIAGNOSIS — Z01812 Encounter for preprocedural laboratory examination: Secondary | ICD-10-CM | POA: Insufficient documentation

## 2019-09-06 DIAGNOSIS — Z20822 Contact with and (suspected) exposure to covid-19: Secondary | ICD-10-CM | POA: Insufficient documentation

## 2019-09-06 LAB — BASIC METABOLIC PANEL
Anion gap: 9 (ref 5–15)
BUN: 19 mg/dL (ref 8–23)
CO2: 25 mmol/L (ref 22–32)
Calcium: 9.2 mg/dL (ref 8.9–10.3)
Chloride: 105 mmol/L (ref 98–111)
Creatinine, Ser: 0.97 mg/dL (ref 0.44–1.00)
GFR calc Af Amer: >60 mL/min (ref >60)
GFR calc non Af Amer: 56 mL/min — ABNORMAL LOW (ref >60)
Glucose, Bld: 104 mg/dL — ABNORMAL HIGH (ref 70–99)
Potassium: 3.9 mmol/L (ref 3.5–5.1)
Sodium: 139 mmol/L (ref 135–145)

## 2019-09-06 LAB — TYPE AND SCREEN
ABO/RH(D): A POS
Antibody Screen: NEGATIVE

## 2019-09-06 LAB — CBC
HCT: 40 % (ref 36.0–46.0)
Hemoglobin: 13.3 g/dL (ref 12.0–15.0)
MCH: 31.2 pg (ref 26.0–34.0)
MCHC: 33.3 g/dL (ref 30.0–36.0)
MCV: 93.9 fL (ref 80.0–100.0)
Platelets: 289 10*3/uL (ref 150–400)
RBC: 4.26 MIL/uL (ref 3.87–5.11)
RDW: 13.5 % (ref 11.5–15.5)
WBC: 7.4 10*3/uL (ref 4.0–10.5)
nRBC: 0 % (ref 0.0–0.2)

## 2019-09-06 LAB — SARS CORONAVIRUS 2 (TAT 6-24 HRS): SARS Coronavirus 2: NEGATIVE

## 2019-09-09 ENCOUNTER — Other Ambulatory Visit: Payer: Self-pay | Admitting: Physician Assistant

## 2019-09-11 ENCOUNTER — Ambulatory Visit
Admission: RE | Admit: 2019-09-11 | Discharge: 2019-09-11 | Disposition: A | Discharge status: Home / Self Care | Payer: PPO | Attending: Obstetrics and Gynecology | Admitting: Obstetrics and Gynecology

## 2019-09-11 ENCOUNTER — Ambulatory Visit: Payer: PPO | Admitting: Anesthesiology

## 2019-09-11 ENCOUNTER — Encounter
Admission: RE | Disposition: A | Discharge status: Home / Self Care | Payer: Self-pay | Source: Home / Self Care | Attending: Obstetrics and Gynecology

## 2019-09-11 ENCOUNTER — Encounter: Payer: Self-pay | Admitting: Obstetrics and Gynecology

## 2019-09-11 DIAGNOSIS — Z880 Allergy status to penicillin: Secondary | ICD-10-CM | POA: Diagnosis not present

## 2019-09-11 DIAGNOSIS — Z79890 Hormone replacement therapy (postmenopausal): Secondary | ICD-10-CM | POA: Insufficient documentation

## 2019-09-11 DIAGNOSIS — I10 Essential (primary) hypertension: Secondary | ICD-10-CM | POA: Diagnosis not present

## 2019-09-11 DIAGNOSIS — N736 Female pelvic peritoneal adhesions (postinfective): Secondary | ICD-10-CM | POA: Diagnosis not present

## 2019-09-11 DIAGNOSIS — K219 Gastro-esophageal reflux disease without esophagitis: Secondary | ICD-10-CM | POA: Insufficient documentation

## 2019-09-11 DIAGNOSIS — F419 Anxiety disorder, unspecified: Secondary | ICD-10-CM | POA: Insufficient documentation

## 2019-09-11 DIAGNOSIS — Z79899 Other long term (current) drug therapy: Secondary | ICD-10-CM | POA: Diagnosis not present

## 2019-09-11 DIAGNOSIS — Z885 Allergy status to narcotic agent status: Secondary | ICD-10-CM | POA: Insufficient documentation

## 2019-09-11 DIAGNOSIS — N838 Other noninflammatory disorders of ovary, fallopian tube and broad ligament: Secondary | ICD-10-CM | POA: Insufficient documentation

## 2019-09-11 DIAGNOSIS — N83292 Other ovarian cyst, left side: Secondary | ICD-10-CM | POA: Insufficient documentation

## 2019-09-11 DIAGNOSIS — Z8249 Family history of ischemic heart disease and other diseases of the circulatory system: Secondary | ICD-10-CM | POA: Insufficient documentation

## 2019-09-11 DIAGNOSIS — E7800 Pure hypercholesterolemia, unspecified: Secondary | ICD-10-CM | POA: Diagnosis not present

## 2019-09-11 DIAGNOSIS — Z791 Long term (current) use of non-steroidal anti-inflammatories (NSAID): Secondary | ICD-10-CM | POA: Insufficient documentation

## 2019-09-11 DIAGNOSIS — M1991 Primary osteoarthritis, unspecified site: Secondary | ICD-10-CM | POA: Insufficient documentation

## 2019-09-11 DIAGNOSIS — G709 Myoneural disorder, unspecified: Secondary | ICD-10-CM | POA: Diagnosis not present

## 2019-09-11 DIAGNOSIS — N83291 Other ovarian cyst, right side: Secondary | ICD-10-CM | POA: Diagnosis not present

## 2019-09-11 DIAGNOSIS — N83209 Unspecified ovarian cyst, unspecified side: Secondary | ICD-10-CM | POA: Diagnosis not present

## 2019-09-11 DIAGNOSIS — K660 Peritoneal adhesions (postprocedural) (postinfection): Secondary | ICD-10-CM | POA: Diagnosis not present

## 2019-09-11 DIAGNOSIS — E039 Hypothyroidism, unspecified: Secondary | ICD-10-CM | POA: Insufficient documentation

## 2019-09-11 HISTORY — PX: LAPAROSCOPIC BILATERAL SALPINGO OOPHERECTOMY: SHX5890

## 2019-09-11 LAB — ABO/RH: ABO/RH(D): A POS

## 2019-09-11 SURGERY — SALPINGO-OOPHORECTOMY, BILATERAL, LAPAROSCOPIC
Anesthesia: General | Site: Abdomen | Laterality: Bilateral

## 2019-09-11 MED ORDER — ORAL CARE MOUTH RINSE: 15.0000 mL | Freq: Once | OROMUCOSAL | Status: AC

## 2019-09-11 MED ORDER — LIDOCAINE HCL (CARDIAC) PF 100 MG/5ML IV SOSY
PREFILLED_SYRINGE | INTRAVENOUS | Status: DC | PRN
  Administered 2019-09-11: 60 mg via INTRAVENOUS

## 2019-09-11 MED ORDER — FENTANYL CITRATE (PF) 250 MCG/5ML IJ SOLN
INTRAMUSCULAR | Status: AC
  Filled 2019-09-11: qty 5

## 2019-09-11 MED ORDER — SUGAMMADEX SODIUM 500 MG/5ML IV SOLN
INTRAVENOUS | Status: DC | PRN
  Administered 2019-09-11: 200 mg via INTRAVENOUS

## 2019-09-11 MED ORDER — ONDANSETRON HCL 4 MG/2ML IJ SOLN: 4.0000 mg | Freq: Once | INTRAMUSCULAR | Status: DC | PRN

## 2019-09-11 MED ORDER — OXYCODONE HCL 5 MG PO TABS
ORAL_TABLET | ORAL | Status: AC
  Filled 2019-09-11: qty 1

## 2019-09-11 MED ORDER — PROPOFOL 10 MG/ML IV BOLUS
INTRAVENOUS | Status: DC | PRN
  Administered 2019-09-11: 120 mg via INTRAVENOUS

## 2019-09-11 MED ORDER — FENTANYL CITRATE (PF) 100 MCG/2ML IJ SOLN
INTRAMUSCULAR | Status: DC | PRN
  Administered 2019-09-11 (×2): 50 ug via INTRAVENOUS
  Administered 2019-09-11: 100 ug via INTRAVENOUS
  Administered 2019-09-11: 50 ug via INTRAVENOUS

## 2019-09-11 MED ORDER — DEXAMETHASONE SODIUM PHOSPHATE 10 MG/ML IJ SOLN
INTRAMUSCULAR | Status: AC
  Filled 2019-09-11: qty 1

## 2019-09-11 MED ORDER — PROPOFOL 500 MG/50ML IV EMUL
INTRAVENOUS | Status: AC
  Filled 2019-09-11: qty 50

## 2019-09-11 MED ORDER — DEXAMETHASONE SODIUM PHOSPHATE 10 MG/ML IJ SOLN
INTRAMUSCULAR | Status: DC | PRN
  Administered 2019-09-11: 5 mg via INTRAVENOUS

## 2019-09-11 MED ORDER — LACTATED RINGERS IV SOLN: INTRAVENOUS | Status: DC

## 2019-09-11 MED ORDER — ROCURONIUM BROMIDE 100 MG/10ML IV SOLN
INTRAVENOUS | Status: DC | PRN
  Administered 2019-09-11: 10 mg via INTRAVENOUS
  Administered 2019-09-11: 40 mg via INTRAVENOUS

## 2019-09-11 MED ORDER — OXYCODONE HCL 5 MG/5ML PO SOLN: 5.0000 mg | Freq: Once | ORAL | Status: AC | PRN

## 2019-09-11 MED ORDER — EPHEDRINE SULFATE 50 MG/ML IJ SOLN
INTRAMUSCULAR | Status: DC | PRN
  Administered 2019-09-11: 5 mg via INTRAVENOUS

## 2019-09-11 MED ORDER — CHLORHEXIDINE GLUCONATE 0.12 % MT SOLN: 15.0000 mL | Freq: Once | OROMUCOSAL | Status: AC

## 2019-09-11 MED ORDER — ONDANSETRON HCL 4 MG/2ML IJ SOLN
INTRAMUSCULAR | Status: AC
  Filled 2019-09-11: qty 2

## 2019-09-11 MED ORDER — ACETAMINOPHEN 500 MG PO TABS
ORAL_TABLET | ORAL | Status: AC
  Administered 2019-09-11: 1000 mg via ORAL
  Filled 2019-09-11: qty 2

## 2019-09-11 MED ORDER — BUPIVACAINE HCL (PF) 0.5 % IJ SOLN
INTRAMUSCULAR | Status: AC
  Filled 2019-09-11: qty 30

## 2019-09-11 MED ORDER — PHENYLEPHRINE HCL (PRESSORS) 10 MG/ML IV SOLN
INTRAVENOUS | Status: DC | PRN
  Administered 2019-09-11: 100 ug via INTRAVENOUS

## 2019-09-11 MED ORDER — GABAPENTIN 300 MG PO CAPS: 300.0000 mg | ORAL_CAPSULE | ORAL | Status: AC

## 2019-09-11 MED ORDER — BUPIVACAINE HCL 0.5 % IJ SOLN
INTRAMUSCULAR | Status: DC | PRN
  Administered 2019-09-11: 10 mL

## 2019-09-11 MED ORDER — ONDANSETRON HCL 4 MG/2ML IJ SOLN
INTRAMUSCULAR | Status: DC | PRN
  Administered 2019-09-11: 4 mg via INTRAVENOUS

## 2019-09-11 MED ORDER — FENTANYL CITRATE (PF) 100 MCG/2ML IJ SOLN: 25.0000 ug | INTRAMUSCULAR | Status: DC | PRN

## 2019-09-11 MED ORDER — OXYCODONE HCL 5 MG PO TABS
5.0000 mg | ORAL_TABLET | Freq: Once | ORAL | Status: AC | PRN
  Administered 2019-09-11: 5 mg via ORAL

## 2019-09-11 MED ORDER — ACETAMINOPHEN 500 MG PO TABS: 1000.0000 mg | ORAL_TABLET | ORAL | Status: AC

## 2019-09-11 MED ORDER — LIDOCAINE HCL (PF) 2 % IJ SOLN
INTRAMUSCULAR | Status: AC
  Filled 2019-09-11: qty 5

## 2019-09-11 MED ORDER — PROPOFOL 500 MG/50ML IV EMUL
INTRAVENOUS | Status: DC | PRN
  Administered 2019-09-11: 100 ug/kg/min via INTRAVENOUS

## 2019-09-11 MED ORDER — GABAPENTIN 300 MG PO CAPS
ORAL_CAPSULE | ORAL | Status: AC
  Administered 2019-09-11: 300 mg via ORAL
  Filled 2019-09-11: qty 1

## 2019-09-11 MED ORDER — CHLORHEXIDINE GLUCONATE 0.12 % MT SOLN
OROMUCOSAL | Status: AC
  Administered 2019-09-11: 15 mL via OROMUCOSAL
  Filled 2019-09-11: qty 15

## 2019-09-11 MED ORDER — ROCURONIUM BROMIDE 10 MG/ML (PF) SYRINGE
PREFILLED_SYRINGE | INTRAVENOUS | Status: AC
  Filled 2019-09-11: qty 10

## 2019-09-11 MED ORDER — SODIUM CHLORIDE 0.9 % IV SOLN
INTRAVENOUS | Status: DC | PRN
  Administered 2019-09-11: 30 ug/min via INTRAVENOUS

## 2019-09-11 MED ORDER — KETOROLAC TROMETHAMINE 30 MG/ML IJ SOLN
INTRAMUSCULAR | Status: DC | PRN
  Administered 2019-09-11: 15 mg via INTRAVENOUS

## 2019-09-11 MED ORDER — POVIDONE-IODINE 10 % EX SWAB: 2.0000 "application " | Freq: Once | CUTANEOUS | Status: DC

## 2019-09-11 SURGICAL SUPPLY — 49 items
APPLICATOR ARISTA FLEXITIP XL (MISCELLANEOUS) ×3 IMPLANT
BAG URINE DRAIN 2000ML AR STRL (UROLOGICAL SUPPLIES) ×3 IMPLANT
BLADE SURG SZ11 CARB STEEL (BLADE) ×3 IMPLANT
CANISTER SUCT 1200ML W/VALVE (MISCELLANEOUS) ×3 IMPLANT
CATH ROBINSON RED A/P 16FR (CATHETERS) ×3 IMPLANT
CHLORAPREP W/TINT 26 (MISCELLANEOUS) ×3 IMPLANT
CLOSURE WOUND 1/4X4 (GAUZE/BANDAGES/DRESSINGS)
CNTNR SPEC 2.5X3XGRAD LEK (MISCELLANEOUS) ×1
CONT SPEC 4OZ STER OR WHT (MISCELLANEOUS) ×2
CONTAINER SPEC 2.5X3XGRAD LEK (MISCELLANEOUS) ×1 IMPLANT
COVER WAND RF STERILE (DRAPES) ×3 IMPLANT
DERMABOND ADVANCED (GAUZE/BANDAGES/DRESSINGS) ×2
DERMABOND ADVANCED .7 DNX12 (GAUZE/BANDAGES/DRESSINGS) ×1 IMPLANT
DRSG TEGADERM 2-3/8X2-3/4 SM (GAUZE/BANDAGES/DRESSINGS) ×9 IMPLANT
DRSG TEGADERM 4X4.75 (GAUZE/BANDAGES/DRESSINGS) ×3 IMPLANT
GLOVE SURG SYN 8.0 (GLOVE) ×3 IMPLANT
GOWN STRL REUS W/ TWL LRG LVL3 (GOWN DISPOSABLE) ×2 IMPLANT
GOWN STRL REUS W/ TWL XL LVL3 (GOWN DISPOSABLE) ×1 IMPLANT
GOWN STRL REUS W/TWL LRG LVL3 (GOWN DISPOSABLE) ×4
GOWN STRL REUS W/TWL XL LVL3 (GOWN DISPOSABLE) ×2
GRASPER SUT TROCAR 14GX15 (MISCELLANEOUS) ×3 IMPLANT
HEMOSTAT ARISTA ABSORB 3G PWDR (HEMOSTASIS) ×3 IMPLANT
IRRIGATION STRYKERFLOW (MISCELLANEOUS) IMPLANT
IRRIGATOR STRYKERFLOW (MISCELLANEOUS)
IV NS 1000ML (IV SOLUTION) ×2
IV NS 1000ML BAXH (IV SOLUTION) ×1 IMPLANT
KIT TURNOVER CYSTO (KITS) ×3 IMPLANT
LABEL OR SOLS (LABEL) ×3 IMPLANT
NS IRRIG 500ML POUR BTL (IV SOLUTION) ×3 IMPLANT
PACK GYN LAPAROSCOPIC (MISCELLANEOUS) ×3 IMPLANT
PAD OB MATERNITY 4.3X12.25 (PERSONAL CARE ITEMS) ×3 IMPLANT
PAD PREP 24X41 OB/GYN DISP (PERSONAL CARE ITEMS) ×3 IMPLANT
POUCH SPECIMEN RETRIEVAL 10MM (ENDOMECHANICALS) ×3 IMPLANT
SET TUBE SMOKE EVAC HIGH FLOW (TUBING) ×3 IMPLANT
SHEARS HARMONIC ACE PLUS 36CM (ENDOMECHANICALS) ×3 IMPLANT
SLEEVE ENDOPATH XCEL 5M (ENDOMECHANICALS) ×3 IMPLANT
SPONGE GAUZE 2X2 8PLY STER LF (GAUZE/BANDAGES/DRESSINGS) ×3
SPONGE GAUZE 2X2 8PLY STRL LF (GAUZE/BANDAGES/DRESSINGS) ×6 IMPLANT
STRIP CLOSURE SKIN 1/4X4 (GAUZE/BANDAGES/DRESSINGS) IMPLANT
SUT MNCRL 3-0 UNDYED SH (SUTURE) ×1 IMPLANT
SUT MONOCRYL 3-0 UNDYED (SUTURE) ×2
SUT VIC AB 0 CT1 36 (SUTURE) ×3 IMPLANT
SUT VIC AB 2-0 UR6 27 (SUTURE) ×3 IMPLANT
SUT VIC AB 4-0 SH 27 (SUTURE) ×2
SUT VIC AB 4-0 SH 27XANBCTRL (SUTURE) ×1 IMPLANT
SWABSTK COMLB BENZOIN TINCTURE (MISCELLANEOUS) ×3 IMPLANT
SYR 50ML LL SCALE MARK (SYRINGE) ×3 IMPLANT
TROCAR ENDO BLADELESS 11MM (ENDOMECHANICALS) ×3 IMPLANT
TROCAR XCEL NON-BLD 5MMX100MML (ENDOMECHANICALS) ×3 IMPLANT

## 2019-09-11 NOTE — Transfer of Care (Signed)
Immediate Anesthesia Transfer of Care Note  Patient: Jamie [name]  Procedure(s) Performed: LAPAROSCOPIC BILATERAL SALPINGO OOPHORECTOMY (Bilateral Abdomen)  Patient Location: PACU  Anesthesia Type:General  Level of Consciousness: awake and alert   Airway & Oxygen Therapy: Patient Spontanous Breathing  Post-op Assessment: Report given to RN and Post -op Vital signs reviewed and stable  Post vital signs: Reviewed and stable  Last Vitals:  Vitals Value Taken Time  BP 115/54 09/11/19 1302  Temp 35.9 C 09/11/19 1302  Pulse 75 09/11/19 1314  Resp 16 09/11/19 1314  SpO2 97 % 09/11/19 1314  Vitals shown include unvalidated device data.  Last Pain:  Vitals:   09/11/19 1302  TempSrc:   PainSc: Asleep         Complications: No complications documented.

## 2019-09-11 NOTE — Anesthesia Preprocedure Evaluation (Signed)
Anesthesia Evaluation  Patient identified by MRN, date of birth, ID band Patient awake    Reviewed: Allergy & Precautions, H&P , NPO status , Patient's Chart, lab work & pertinent test results  History of Anesthesia Complications (+) PONV and history of anesthetic complications  Airway Mallampati: III  TM Distance: <3 FB Neck ROM: limited    Dental  (+) Chipped   Pulmonary neg shortness of breath,    Pulmonary exam normal        Cardiovascular Exercise Tolerance: Good hypertension, Normal cardiovascular exam     Neuro/Psych  Headaches, PSYCHIATRIC DISORDERS  Neuromuscular disease    GI/Hepatic Neg liver ROS, GERD  Medicated and Controlled,  Endo/Other  Hypothyroidism   Renal/GU      Musculoskeletal  (+) Arthritis ,   Abdominal   Peds  Hematology negative hematology ROS (+)   Anesthesia Other Findings Past Medical History: No date: Anemia No date: Anxiety No date: Arthritis No date: GERD (gastroesophageal reflux disease) No date: Headache No date: Hypertension     Comment:  patient denies No date: Hypothyroidism No date: Macular degeneration 2021: Ovarian cyst, left No date: PONV (postoperative nausea and vomiting)  Past Surgical History: 1980: ABDOMINAL HYSTERECTOMY No date: APPENDECTOMY 2014: CATARACT EXTRACTION; Bilateral No date: CHOLECYSTECTOMY 03/22/2015: COLONOSCOPY WITH PROPOFOL; N/A     Comment:  Procedure: COLONOSCOPY WITH PROPOFOL;  Surgeon: Scot Jun, MD;  Location: [facility] ENDOSCOPY;  Service:               Endoscopy;  Laterality: N/A; No date: EYE SURGERY No date: TUBAL LIGATION  BMI    Body Mass Index: 23.30 kg/m      Reproductive/Obstetrics negative OB ROS                             Anesthesia Physical Anesthesia Plan  ASA: III  Anesthesia Plan: General ETT   Post-op Pain Management:    Induction: Intravenous  PONV Risk Score  and Plan: Ondansetron, Dexamethasone, Midazolam and Treatment may vary due to age or medical condition  Airway Management Planned: Oral ETT  Additional Equipment:   Intra-op Plan:   Post-operative Plan: Extubation in OR  Informed Consent: I have reviewed the patients History and Physical, chart, labs and discussed the procedure including the risks, benefits and alternatives for the proposed anesthesia with the patient or authorized representative who has indicated his/her understanding and acceptance.     Dental Advisory Given  Plan Discussed with: Anesthesiologist, CRNA and Surgeon  Anesthesia Plan Comments: (Patient consented for risks of anesthesia including but not limited to:  - adverse reactions to medications - damage to eyes, teeth, lips or other oral mucosa - nerve damage due to positioning  - sore throat or hoarseness - Damage to heart, brain, nerves, lungs, other parts of body or loss of life  Patient voiced understanding.)        Anesthesia Quick Evaluation

## 2019-09-11 NOTE — Discharge Instructions (Addendum)
YOU HAD ONE PAIN PILL (OXYCODONE) GIVEN AT THE HOSPITAL AT 2:04 PM.     AMBULATORY SURGERY  DISCHARGE INSTRUCTIONS   1) The drugs that you were given will stay in your system until tomorrow so for the next 24 hours you should not:  A) Drive an automobile B) Make any legal decisions C) Drink any alcoholic beverage   2) You may resume regular meals tomorrow.  Today it is better to start with liquids and gradually work up to solid foods.  You may eat anything you prefer, but it is better to start with liquids, then soup and crackers, and gradually work up to solid foods.   3) Please notify your doctor immediately if you have any unusual bleeding, trouble breathing, redness and pain at the surgery site, drainage, fever, or pain not relieved by medication.    4) Additional Instructions:        Please contact your physician with any problems or Same Day Surgery at [phone], Monday through Friday 6 am to 4 pm, or [place] at [facility] number at [phone].

## 2019-09-11 NOTE — Anesthesia Procedure Notes (Signed)
Procedure Name: Intubation Date/Time: 09/11/2019 11:20 AM Performed by: Arita Miss, MD Pre-anesthesia Checklist: Patient identified, Patient being monitored, Timeout performed, Emergency Drugs available and Suction available Patient Re-evaluated:Patient Re-evaluated prior to induction Oxygen Delivery Method: Circle system utilized Preoxygenation: Pre-oxygenation with 100% oxygen Induction Type: IV induction Ventilation: Mask ventilation without difficulty Laryngoscope Size: 3 and McGraph Grade View: Grade I Tube type: Oral Tube size: 6.5 mm Number of attempts: 2 Placement Confirmation: ETT inserted through vocal cords under direct vision,  positive ETCO2 and breath sounds checked- equal and bilateral Secured at: 20 cm Tube secured with: Tape Dental Injury: Teeth and Oropharynx as per pre-operative assessment  Difficulty Due To: Difficulty was anticipated Comments: Small chin, large overbite and prominent incisors; Grade 3 view obtained with Mac 3 blade. Switched to DIRECTV 3, blade still large for patient's mouth but able to obtain Grade 1 View.

## 2019-09-11 NOTE — Brief Op Note (Signed)
09/11/2019  12:50 PM  PATIENT:  Jamie [name]  79 y.o. female  PRE-OPERATIVE DIAGNOSIS:  COMPLEX LEFT OVARIAN CYST  POST-OPERATIVE DIAGNOSIS:  COMPLEX LEFT OVARIAN CYST abdomino-pelvic adhesions PROCEDURE:  Procedure(s): LAPAROSCOPIC BILATERAL SALPINGO OOPHORECTOMY (Bilateral) Lysis of adhesions  SURGEON:  Surgeon(s) and Role:    * Vaishnavi Dalby, Ihor Austin, MD - Primary    Christeen Douglas, MD  PHYSICIAN ASSISTANT:   ASSISTANTS: none   ANESTHESIA:   general  EBL:  10 mL IOF 1000 cc UO 300 cc  BLOOD ADMINISTERED:none  DRAINS: none   LOCAL MEDICATIONS USED:  MARCAINE     SPECIMEN:  Source of Specimen:  right and left ovary and tubes   DISPOSITION OF SPECIMEN:  PATHOLOGY  COUNTS:  YES  TOURNIQUET:  * No tourniquets in log *  DICTATION: .Other Dictation: Dictation Number verbal   PLAN OF CARE: Discharge to home after PACU  PATIENT DISPOSITION:  PACU - hemodynamically stable.   Delay start of Pharmacological VTE agent (>24hrs) due to surgical blood loss or risk of bleeding: not applicable

## 2019-09-11 NOTE — Op Note (Signed)
NAME: Jamie [name], Jamie [name] MEDICAL RECORD [id number] ACCOUNT [id number] DATE OF BIRTH:[birth year] FACILITY: [facility] LOCATION: [facility] PHYSICIAN:Ja Pistole Cloyde Reams, MD  OPERATIVE REPORT  DATE OF PROCEDURE:  09/11/2019  PREOPERATIVE DIAGNOSES:  Complex left ovarian cyst.  POSTOPERATIVE DIAGNOSES: 1.  Complex left ovarian cyst. 2.  Significant abdominopelvic adhesions.  PROCEDURES: 1.  Laparoscopic bilateral salpingo-oophorectomy. 2.  Cell washings. 3.  Pelvic and abdominal adhesiolysis incorporating greater than 50% of operating time.  SURGEON:  Jennell Corner, MD.  FIRST ASSISTANT:  Christeen Douglas, MD   ANESTHESIA:  General endotracheal anesthesia.  INDICATIONS:  A 79 year old female who has been followed in the clinic for a complex left ovarian cyst, which had demonstrated minimal change in size.  However, recent cancer antigen test specifically HE-4 was abnormal, prompting surgical intervention.  DESCRIPTION OF PROCEDURE:  After adequate general endotracheal anesthesia, the patient was placed in dorsal supine position, legs in the Perry stirrups.  The patient's abdomen, perineum and vagina were prepped and draped in normal sterile fashion.  A  timeout was performed.  A Foley catheter was placed into the bladder and was kept in for the case.  A sponge stick was placed in the vagina to be used for pelvic manipulation during the procedure.  Gloves were changed, gown was changed.  Attention was  directed to the patient's abdomen where a 5 mm infraumbilical incision was made after injecting with 0.5% Marcaine.  A 5 mm laparoscope was advanced into the abdominal cavity under direct visualization.  Initial impression revealed multiple adhesions in  the pelvis and right mid to upper abdomen.  There was a large multiloculated left ovarian cyst.  There was no free fluid in the posterior cul-de-sac.  A second port site was placed in the left lower quadrant 3 cm medial to the left  anterior iliac spine.   An 11 mm trocar was advanced under direct visualization.  Likewise, a third port was placed in the right lower side 3 cm medial to the right anterior iliac spine and a 5 mm trocar was advanced under direct visualization.  Pelvic cell washings were  performed and attention was directed to the patient's left ovary where the left infundibulopelvic ligament was cauterized with the Kleppinger cautery and then dissected and transected with a Harmonic scalpel.  This left ovary was somewhat adherent and  scarred to the left sidewall and dissection occurred with the Harmonic scalpel and countertraction.  Ultimately, the left tube and ovary were dissected free.  The right ovary was then grasped and the right infundibulopelvic ligament was cauterized,  transected and the right fallopian tube and ovary were dissected free.  For the next 25 minutes of the case, adhesiolysis occurred, which was greater than 50% of total operating time.  Multiple adhesions were removed from prior cholecystectomy.  This  resulted in freeing the adhesions.Arista used on omentum to control oozing .  Good hemostasis was noted.  An Endobag was placed into the patient's abdomen and both fallopian tubes and ovaries were placed within the bag.  The fascia was then extended with blunt traction and the ovaries and  fallopian tubes were removed through the left lower port site intact.  The fascia was then closed on the left lower port site with a running 2-0 Vicryl suture.  Subcutaneous tissues were reapproximated with 4-0 Monocryl and 4-0 Vicryl was used to close  the left lower port site skin in a subcuticular fashion.  Two interrupted 4-0 Vicryls were used to close the other port sites.  Sterile dressings were applied.  Sponge stick was removed, as well as the Foley catheter.  COMPLICATIONS:  There were no complications.  ESTIMATED BLOOD LOSS:  Minimal.  INTRAOPERATIVE FLUIDS:  1000 mL.  URINE OUTPUT:  300  mL.  DISPOSITION:  The patient was taken to recovery room in good condition.  VN/NUANCE  D:09/11/2019 T:09/11/2019 JOB:[id number]

## 2019-09-11 NOTE — Progress Notes (Signed)
Ready for L/S BSO . LAbs reviewed . NPO . All questions answered . Proceed

## 2019-09-12 ENCOUNTER — Encounter: Payer: Self-pay | Admitting: Obstetrics and Gynecology

## 2019-09-12 NOTE — Anesthesia Postprocedure Evaluation (Signed)
Anesthesia Post Note  Patient: GIANNA CALEF  Procedure(s) Performed: LAPAROSCOPIC BILATERAL SALPINGO OOPHORECTOMY (Bilateral Abdomen)  Patient location during evaluation: PACU Anesthesia Type: General Level of consciousness: awake and alert Pain management: pain level controlled Vital Signs Assessment: post-procedure vital signs reviewed and stable Respiratory status: spontaneous breathing, nonlabored ventilation, respiratory function stable and patient connected to nasal cannula oxygen Cardiovascular status: blood pressure returned to baseline and stable Postop Assessment: no apparent nausea or vomiting Anesthetic complications: no   No complications documented.   Last Vitals:  Vitals:   09/11/19 1424 09/11/19 1445  BP: (!) 142/66 (!) 146/55  Pulse: 76 78  Resp: 16 14  Temp: (!) 36.2 C   SpO2: 95% 95%    Last Pain:  Vitals:   09/12/19 0826  TempSrc:   PainSc: 5                  Corinda Gubler

## 2019-09-13 LAB — SURGICAL PATHOLOGY

## 2019-09-13 LAB — CYTOLOGY - NON PAP

## 2019-09-21 ENCOUNTER — Other Ambulatory Visit: Payer: Self-pay | Admitting: Physician Assistant

## 2019-09-22 ENCOUNTER — Telehealth: Payer: Self-pay

## 2019-09-22 NOTE — Telephone Encounter (Signed)
Copied from CRM [id number]. Topic: General - Call Back - No Documentation >> Sep 22, 2019  2:33 PM Lyn Hollingshead D wrote: PT returning call

## 2019-09-22 NOTE — Telephone Encounter (Signed)
Did you call the patient? I didn't. But I see she is scheduled 08/04 with you. Maybe the automated system?

## 2019-09-25 NOTE — Telephone Encounter (Signed)
Spoke with pt and advised her I did not call her and that it was probably the automated system. Pt agreed. Verified telephonic AWV for 09/27/19 @ 2:00 PM.

## 2019-09-26 NOTE — Progress Notes (Signed)
Subjective:   Jamie [name] is a 79 y.o. female who presents for Medicare Annual (Subsequent) preventive examination.  I connected with Fransisco Beauatricia Danko today by telephone and verified that I am speaking with the correct person using two identifiers. Location patient: home Location provider: work Persons participating in the virtual visit: patient, provider.   I discussed the limitations, risks, security and privacy concerns of performing an evaluation and management service by telephone and the availability of in person appointments. I also discussed with the patient that there may be a patient responsible charge related to this service. The patient expressed understanding and verbally consented to this telephonic visit.    Interactive audio and video telecommunications were attempted between this provider and patient, however failed, due to patient having technical difficulties OR patient did not have access to video capability.  We continued and completed visit with audio only.   Review of Systems    N/A  Cardiac Risk Factors include: advanced age (>1555men, 30>65 women)     Objective:    There were no vitals filed for this visit. There is no height or weight on file to calculate BMI.  Advanced Directives 09/27/2019 09/11/2019 08/31/2019 01/27/2018 01/13/2017 03/22/2015 12/25/2014  Does Patient Have a Medical Advance Directive? Yes Yes Yes Yes Yes Yes Yes  Type of Estate agentAdvance Directive Healthcare Power of [place];Living will Healthcare Power of [place]orney;Living will Healthcare Power of [place]orney;Living will Healthcare Power of [place]orney;Living will Healthcare Power of [place]orney;Living will Healthcare Power of Attorney Living will;Healthcare Power of Attorney  Does patient want to make changes to medical advance directive? - No - Patient declined No - Patient declined - - - -  Copy of Healthcare Power of Attorney in Chart? No - copy requested No - copy requested No - copy requested No - copy requested  No - copy requested No - copy requested -    Current Medications (verified) Outpatient Encounter Medications as of 09/27/2019  Medication Sig  . Acetaminophen-Caffeine (TENSION HEADACHE) 500-65 MG TABS Take 2 tablets by mouth daily as needed (headaches).  . ALPRAZolam (XANAX) 0.25 MG tablet TAKE 1/2 TABLET BY MOUTH DAILY AS NEEDED (Patient taking differently: Take 0.125 mg by mouth at bedtime. )  . b complex vitamins tablet Take 1 tablet by mouth daily.  . cetirizine (ZYRTEC) 10 MG tablet Take 10 mg by mouth daily. As needed  . Cholecalciferol (VITAMIN D) 50 MCG (2000 UT) tablet Take 2,000 Units by mouth daily.  Marland Kitchen. esomeprazole (NEXIUM) 20 MG capsule Take 20 mg by mouth daily.  Marland Kitchen. estradiol (ESTRACE) 0.5 MG tablet TAKE 1 TABLET BY MOUTH EVERY DAY (Patient taking differently: Take 0.5 mg by mouth daily. )  . meloxicam (MOBIC) 7.5 MG tablet Take 1 tablet (7.5 mg total) by mouth daily as needed. (Patient taking differently: Take 7.5 mg by mouth daily as needed for pain. )  . MULTIPLE VITAMIN PO Take 1 tablet by mouth daily.   . Multiple Vitamins-Minerals (PRESERVISION AREDS 2 PO) Take 1 tablet by mouth 2 (two) times daily.  . nortriptyline (PAMELOR) 50 MG capsule TAKE 1 CAPSULE BY MOUTH EVERY DAY  . Polyvinyl Alcohol-Povidone (REFRESH OP) Place 1 drop into both eyes daily as needed (dry eyes).  . SYNTHROID 125 MCG tablet TAKE 1 TABLET BY MOUTH EVERY DAY  . tiZANidine (ZANAFLEX) 4 MG tablet TAKE 0.5-1 TABLETS (2-4 MG TOTAL) BY MOUTH EVERY 6 (SIX) HOURS AS NEEDED FOR MUSCLE SPASMS.  . vitamin E 1000 UNIT capsule Take 1,000 Units by  mouth daily.  . [DISCONTINUED] tiZANidine (ZANAFLEX) 4 MG tablet Take 0.5-1 tablets (2-4 mg total) by mouth every 6 (six) hours as needed for muscle spasms.   No facility-administered encounter medications on file as of 09/27/2019.    Allergies (verified) Codeine and Penicillins   History: Past Medical History:  Diagnosis Date  . Anemia   . Anxiety   . Arthritis     . GERD (gastroesophageal reflux disease)   . Headache   . Hypertension    patient denies  . Hypothyroidism   . Macular degeneration   . Ovarian cyst, left 2021  . PONV (postoperative nausea and vomiting)    Past Surgical History:  Procedure Laterality Date  . ABDOMINAL HYSTERECTOMY  1980  . APPENDECTOMY    . CATARACT EXTRACTION Bilateral 2014  . CHOLECYSTECTOMY    . COLONOSCOPY WITH PROPOFOL N/A 03/22/2015   Procedure: COLONOSCOPY WITH PROPOFOL;  Surgeon: Scot Jun, MD;  Location: [facility] ENDOSCOPY;  Service: Endoscopy;  Laterality: N/A;  . EYE SURGERY    . LAPAROSCOPIC BILATERAL SALPINGO OOPHERECTOMY Bilateral 09/11/2019   Procedure: LAPAROSCOPIC BILATERAL SALPINGO OOPHORECTOMY;  Surgeon: Schermerhorn, Ihor Austin, MD;  Location: [facility] ORS;  Service: Gynecology;  Laterality: Bilateral;  . TUBAL LIGATION     Family History  Problem Relation Age of Onset  . Hyperlipidemia Mother   . Heart attack Father   . Emphysema Father   . Healthy Sister   . Diabetes Brother   . Lymphoma Brother   . Stroke Maternal Grandmother   . Breast cancer Neg Hx    Social History   Socioeconomic History  . Marital status: Married    Spouse name: Not on file  . Number of children: 2  . Years of education: Not on file  . Highest education level: High school graduate  Occupational History  . Occupation: retired  Tobacco Use  . Smoking status: Never Smoker  . Smokeless tobacco: Never Used  Vaping Use  . Vaping Use: Never used  Substance and Sexual Activity  . Alcohol use: No  . Drug use: No  . Sexual activity: Not on file  Other Topics Concern  . Not on file  Social History Narrative  . Not on file   Social Determinants of Health   Financial Resource Strain: Low Risk   . Difficulty of Paying Living Expenses: Not hard at all  Food Insecurity: No Food Insecurity  . Worried About Programme researcher, broadcasting/film/video in the Last Year: Never true  . Ran Out of Food in the Last Year: Never true   Transportation Needs: No Transportation Needs  . Lack of Transportation (Medical): No  . Lack of Transportation (Non-Medical): No  Physical Activity: Sufficiently Active  . Days of Exercise per Week: 6 days  . Minutes of Exercise per Session: 30 min  Stress: No Stress Concern Present  . Feeling of Stress : Not at all  Social Connections: Moderately Integrated  . Frequency of Communication with Friends and Family: Three times a week  . Frequency of Social Gatherings with Friends and Family: Three times a week  . Attends Religious Services: More than 4 times per year  . Active Member of Clubs or Organizations: No  . Attends Banker Meetings: Never  . Marital Status: Married    Tobacco Counseling Counseling given: Not Answered   Clinical Intake:  Pre-visit preparation completed: Yes  Pain : No/denies pain     Nutritional Risks: None Diabetes: No  How often do you  need to have someone help you when you read instructions, pamphlets, or other written materials from your doctor or pharmacy?: 1 - Never  Diabetic? No  Interpreter Needed?: No  Information entered by :: [facility], LPN   Activities of Daily Living In your present state of health, do you have any difficulty performing the following activities: 09/27/2019 08/31/2019  Hearing? N N  Vision? N N  Difficulty concentrating or making decisions? N N  Walking or climbing stairs? N N  Dressing or bathing? N N  Doing errands, shopping? N N  Preparing Food and eating ? N -  Using the Toilet? N -  In the past six months, have you accidently leaked urine? Y -  Comment Occasionally  with urgency. -  Do you have problems with loss of bowel control? N -  Managing your Medications? N -  Managing your Finances? N -  Housekeeping or managing your Housekeeping? N -  Some recent data might be hidden    Patient Care Team: Margaretann Loveless, PA-C as PCP - General (Family Medicine) Pa, [facility]  Od  Indicate any recent Medical Services you may have received from other than [facility] providers in the past year (date may be approximate).     Assessment:   This is a routine wellness examination for Michaelina.  Hearing/Vision screen No exam data present  Dietary issues and exercise activities discussed: Current Exercise Habits: Home exercise routine, Type of exercise: walking, Time (Minutes): 30, Frequency (Times/Week): 4, Weekly Exercise (Minutes/Week): 120, Intensity: Mild, Exercise limited by: None identified  Goals    . DIET - INCREASE WATER INTAKE     Recommend increasing water intake to 6-8 glasses a day.       Depression Screen PHQ 2/9 Scores 08/30/2019 05/19/2019 01/27/2018 01/27/2018 01/13/2017 01/13/2016 12/25/2014  PHQ - 2 Score 0 - 0 0 0 0 0  PHQ- 9 Score - - 0 - - - -  Exception Documentation - Patient refusal - - - - -    Fall Risk Fall Risk  09/27/2019 05/19/2019 01/18/2019 01/27/2018 01/13/2017  Falls in the past year? 0 0 0 0 No  Comment - - Emmi Telephone Survey: data to providers prior to load - -  Number falls in past yr: 0 0 - - -  Injury with Fall? 0 0 - - -  Follow up - Falls evaluation completed - - -    Any stairs in or around the home? Yes  If so, are there any without handrails? No  Home free of loose throw rugs in walkways, pet beds, electrical cords, etc? Yes  Adequate lighting in your home to reduce risk of falls? Yes   ASSISTIVE DEVICES UTILIZED TO PREVENT FALLS:  Life alert? No  Use of a cane, walker or w/c? No  Grab bars in the bathroom? Yes  Shower chair or bench in shower? Yes  Elevated toilet seat or a handicapped toilet? Yes    Cognitive Function: Declined today.        Immunizations Immunization History  Administered Date(s) Administered  . Influenza, High Dose Seasonal PF 01/03/2015, 12/12/2015, 12/03/2016, 12/15/2017  . Influenza-Unspecified 10/24/2013, 11/11/2018  . PFIZER SARS-COV-2 Vaccination 04/26/2019, 05/17/2019  .  Pneumococcal Conjugate-13 01/10/2014  . Pneumococcal Polysaccharide-23 05/15/2010    TDAP status: Due, Education has been provided regarding the importance of this vaccine. Advised may receive this vaccine at local pharmacy or Health Dept. Aware to provide a copy of the vaccination record if obtained from  local pharmacy or Health Dept. Verbalized acceptance and understanding. Flu Vaccine status: Up to date Pneumococcal vaccine status: Up to date Covid-19 vaccine status: Completed vaccines  Qualifies for Shingles Vaccine? Yes   Zostavax completed No   Shingrix Completed?: No.    Education has been provided regarding the importance of this vaccine. Patient has been advised to call insurance company to determine out of pocket expense if they have not yet received this vaccine. Advised may also receive vaccine at local pharmacy or Health Dept. Verbalized acceptance and understanding.  Screening Tests Health Maintenance  Topic Date Due  . INFLUENZA VACCINE  09/24/2019  . TETANUS/TDAP  09/26/2020 (Originally 01/17/1960)  . DEXA SCAN  03/11/2022  . COVID-19 Vaccine  Completed  . PNA vac Low Risk Adult  Completed    Health Maintenance  Health Maintenance Due  Topic Date Due  . INFLUENZA VACCINE  09/24/2019    Colorectal cancer screening: No longer required.  Mammogram status: No longer required.  Bone Density status: Completed 03/11/17. Results reflect: Bone density results: OSTEOPENIA. Repeat every 5 years.  Lung Cancer Screening: (Low Dose CT Chest recommended if Age 34-80 years, 30 pack-year currently smoking OR have quit w/in 15years.) does not qualify.   Additional Screening:  Vision Screening: Recommended annual ophthalmology exams for early detection of glaucoma and other disorders of the eye. Is the patient up to date with their annual eye exam?  Yes  Who is the provider or what is the name of the office in which the patient attends annual eye exams? Dr Jean Rosenthal If pt is not  established with a provider, would they like to be referred to a provider to establish care? No .   Dental Screening: Recommended annual dental exams for proper oral hygiene  Community Resource Referral / Chronic Care Management: CRR required this visit?  No   CCM required this visit?  No      Plan:     I have personally reviewed and noted the following in the patient's chart:   . Medical and social history . Use of alcohol, tobacco or illicit drugs  . Current medications and supplements . Functional ability and status . Nutritional status . Physical activity . Advanced directives . List of other physicians . Hospitalizations, surgeries, and ER visits in previous 12 months . Vitals . Screenings to include cognitive, depression, and falls . Referrals and appointments  In addition, I have reviewed and discussed with patient certain preventive protocols, quality metrics, and best practice recommendations. A written personalized care plan for preventive services as well as general preventive health recommendations were provided to patient.     Yuji Walth [place], [place]   [id number]   Nurse Notes: None.

## 2019-09-27 ENCOUNTER — Other Ambulatory Visit: Payer: Self-pay

## 2019-09-27 ENCOUNTER — Ambulatory Visit (INDEPENDENT_AMBULATORY_CARE_PROVIDER_SITE_OTHER): Payer: PPO

## 2019-09-27 DIAGNOSIS — Z0000 Encounter for general adult medical examination without abnormal findings: Secondary | ICD-10-CM

## 2019-09-27 NOTE — Patient Instructions (Signed)
Jamie [name] , Thank you for taking time to come for your Medicare Wellness Visit. I appreciate your ongoing commitment to your health goals. Please review the following plan we discussed and let me know if I can assist you in the future.   Screening recommendations/referrals: Colonoscopy: No longer required.  Mammogram: No longer required.  Bone Density: Up to date, due 02/2022 Recommended yearly ophthalmology/optometry visit for glaucoma screening and checkup Recommended yearly dental visit for hygiene and checkup  Vaccinations: Influenza vaccine: Due fall 2021 Pneumococcal vaccine: Completed series Tdap vaccine: Currently due, declined at this time. Shingles vaccine: Shingrix discussed. Please contact your pharmacy for coverage information.     Advanced directives: Please bring a copy of your POA (Power of Attorney) and/or Living Will to your next appointment.   Conditions/risks identified: Recommend increasing water intake to 6-8 glasses a day.   Next appointment: None, declined scheduling a follow up with PCP or an AWV for 2022 at this time.   Preventive Care 78 Years and Older, Female Preventive care refers to lifestyle choices and visits with your health care provider that can promote health and wellness. What does preventive care include?  A yearly physical exam. This is also called an annual well check.  Dental exams once or twice a year.  Routine eye exams. Ask your health care provider how often you should have your eyes checked.  Personal lifestyle choices, including:  Daily care of your teeth and gums.  Regular physical activity.  Eating a healthy diet.  Avoiding tobacco and drug use.  Limiting alcohol use.  Practicing safe sex.  Taking low-dose aspirin every day.  Taking vitamin and mineral supplements as recommended by your health care provider. What happens during an annual well check? The services and screenings done by your health care provider during  your annual well check will depend on your age, overall health, lifestyle risk factors, and family history of disease. Counseling  Your health care provider may ask you questions about your:  Alcohol use.  Tobacco use.  Drug use.  Emotional well-being.  Home and relationship well-being.  Sexual activity.  Eating habits.  History of falls.  Memory and ability to understand (cognition).  Work and work Astronomer.  Reproductive health. Screening  You may have the following tests or measurements:  Height, weight, and BMI.  Blood pressure.  Lipid and cholesterol levels. These may be checked every 5 years, or more frequently if you are over 35 years old.  Skin check.  Lung cancer screening. You may have this screening every year starting at age 78 if you have a 30-pack-year history of smoking and currently smoke or have quit within the past 15 years.  Fecal occult blood test (FOBT) of the stool. You may have this test every year starting at age 78.  Flexible sigmoidoscopy or colonoscopy. You may have a sigmoidoscopy every 5 years or a colonoscopy every 10 years starting at age 78.  Hepatitis C blood test.  Hepatitis B blood test.  Sexually transmitted disease (STD) testing.  Diabetes screening. This is done by checking your blood sugar (glucose) after you have not eaten for a while (fasting). You may have this done every 1-3 years.  Bone density scan. This is done to screen for osteoporosis. You may have this done starting at age 78.  Mammogram. This may be done every 1-2 years. Talk to your health care provider about how often you should have regular mammograms. Talk with your health care provider about  your test results, treatment options, and if necessary, the need for more tests. Vaccines  Your health care provider may recommend certain vaccines, such as:  Influenza vaccine. This is recommended every year.  Tetanus, diphtheria, and acellular pertussis (Tdap,  Td) vaccine. You may need a Td booster every 10 years.  Zoster vaccine. You may need this after age 78.  Pneumococcal 13-valent conjugate (PCV13) vaccine. One dose is recommended after age 78.  Pneumococcal polysaccharide (PPSV23) vaccine. One dose is recommended after age 78. Talk to your health care provider about which screenings and vaccines you need and how often you need them. This information is not intended to replace advice given to you by your health care provider. Make sure you discuss any questions you have with your health care provider. Document Released: 03/08/2015 Document Revised: 10/30/2015 Document Reviewed: 12/11/2014 Elsevier Interactive Patient Education  2017 [place] Prevention in the Home Falls can cause injuries. They can happen to people of all ages. There are many things you can do to make your home safe and to help prevent falls. What can I do on the outside of my home?  Regularly fix the edges of walkways and driveways and fix any cracks.  Remove anything that might make you trip as you walk through a door, such as a raised step or threshold.  Trim any bushes or trees on the path to your home.  Use bright outdoor lighting.  Clear any walking paths of anything that might make someone trip, such as rocks or tools.  Regularly check to see if handrails are loose or broken. Make sure that both sides of any steps have handrails.  Any raised decks and porches should have guardrails on the edges.  Have any leaves, snow, or ice cleared regularly.  Use sand or salt on walking paths during winter.  Clean up any spills in your garage right away. This includes oil or grease spills. What can I do in the bathroom?  Use night lights.  Install grab bars by the toilet and in the tub and shower. Do not use towel bars as grab bars.  Use non-skid mats or decals in the tub or shower.  If you need to sit down in the shower, use a plastic, non-slip  stool.  Keep the floor dry. Clean up any water that spills on the floor as soon as it happens.  Remove soap buildup in the tub or shower regularly.  Attach bath mats securely with double-sided non-slip rug tape.  Do not have throw rugs and other things on the floor that can make you trip. What can I do in the bedroom?  Use night lights.  Make sure that you have a light by your bed that is easy to reach.  Do not use any sheets or blankets that are too big for your bed. They should not hang down onto the floor.  Have a firm chair that has side arms. You can use this for support while you get dressed.  Do not have throw rugs and other things on the floor that can make you trip. What can I do in the kitchen?  Clean up any spills right away.  Avoid walking on wet floors.  Keep items that you use a lot in easy-to-reach places.  If you need to reach something above you, use a strong step stool that has a grab bar.  Keep electrical cords out of the way.  Do not use floor polish or wax  that makes floors slippery. If you must use wax, use non-skid floor wax.  Do not have throw rugs and other things on the floor that can make you trip. What can I do with my stairs?  Do not leave any items on the stairs.  Make sure that there are handrails on both sides of the stairs and use them. Fix handrails that are broken or loose. Make sure that handrails are as long as the stairways.  Check any carpeting to make sure that it is firmly attached to the stairs. Fix any carpet that is loose or worn.  Avoid having throw rugs at the top or bottom of the stairs. If you do have throw rugs, attach them to the floor with carpet tape.  Make sure that you have a light switch at the top of the stairs and the bottom of the stairs. If you do not have them, ask someone to add them for you. What else can I do to help prevent falls?  Wear shoes that:  Do not have high heels.  Have rubber bottoms.  Are  comfortable and fit you well.  Are closed at the toe. Do not wear sandals.  If you use a stepladder:  Make sure that it is fully opened. Do not climb a closed stepladder.  Make sure that both sides of the stepladder are locked into place.  Ask someone to hold it for you, if possible.  Clearly mark and make sure that you can see:  Any grab bars or handrails.  First and last steps.  Where the edge of each step is.  Use tools that help you move around (mobility aids) if they are needed. These include:  Canes.  Walkers.  Scooters.  Crutches.  Turn on the lights when you go into a dark area. Replace any light bulbs as soon as they burn out.  Set up your furniture so you have a clear path. Avoid moving your furniture around.  If any of your floors are uneven, fix them.  If there are any pets around you, be aware of where they are.  Review your medicines with your doctor. Some medicines can make you feel dizzy. This can increase your chance of falling. Ask your doctor what other things that you can do to help prevent falls. This information is not intended to replace advice given to you by your health care provider. Make sure you discuss any questions you have with your health care provider. Document Released: 12/06/2008 Document Revised: 07/18/2015 Document Reviewed: 03/16/2014 Elsevier Interactive Patient Education  2017 [employer].

## 2019-10-05 ENCOUNTER — Other Ambulatory Visit: Payer: Self-pay | Admitting: Physician Assistant

## 2019-10-05 DIAGNOSIS — N959 Unspecified menopausal and perimenopausal disorder: Secondary | ICD-10-CM

## 2019-11-01 DIAGNOSIS — H353211 Exudative age-related macular degeneration, right eye, with active choroidal neovascularization: Secondary | ICD-10-CM | POA: Diagnosis not present

## 2019-11-22 ENCOUNTER — Other Ambulatory Visit: Payer: Self-pay | Admitting: Physician Assistant

## 2019-11-22 DIAGNOSIS — M5442 Lumbago with sciatica, left side: Secondary | ICD-10-CM

## 2019-11-22 NOTE — Telephone Encounter (Signed)
Requested Prescriptions  Pending Prescriptions Disp Refills  . meloxicam (MOBIC) 7.5 MG tablet [Pharmacy Med Name: MELOXICAM 7.5 MG TABLET] 90 tablet 1    Sig: TAKE 1 TABLET (7.5 MG TOTAL) BY MOUTH DAILY AS NEEDED.     Analgesics:  COX2 Inhibitors Passed - 11/22/2019  1:13 AM      Passed - HGB in normal range and within 360 days    Hemoglobin  Date Value Ref Range Status  09/06/2019 13.3 12.0 - 15.0 g/dL Final  58/85/0277 41.2 11.1 - 15.9 g/dL Final         Passed - Cr in normal range and within 360 days    Creat  Date Value Ref Range Status  02/18/2017 0.85 0.60 - 0.93 mg/dL Final    Comment:    For patients >30 years of age, the reference limit for Creatinine is approximately 13% higher for people identified as African-American. .    Creatinine, Ser  Date Value Ref Range Status  09/06/2019 0.97 0.44 - 1.00 mg/dL Final         Passed - Patient is not pregnant      Passed - Valid encounter within last 12 months    Recent Outpatient Visits          2 months ago Encounter for preoperative examination for general surgical procedure   [facility] [place], Alessandra Bevels, PA-C   6 months ago Annual physical exam   [facility], Alessandra Bevels, [place]   1 year ago Acute non-recurrent pansinusitis   [facility], Alessandra Bevels, [place]   1 year ago Annual physical exam   [facility] Joycelyn Man M, [place]   2 years ago Iron deficiency anemia secondary to inadequate dietary iron intake   [facility], [place], [place]

## 2019-11-23 ENCOUNTER — Other Ambulatory Visit: Payer: Self-pay | Admitting: Physician Assistant

## 2019-11-23 DIAGNOSIS — N959 Unspecified menopausal and perimenopausal disorder: Secondary | ICD-10-CM

## 2019-12-21 ENCOUNTER — Ambulatory Visit: Payer: PPO

## 2019-12-22 ENCOUNTER — Other Ambulatory Visit: Payer: Self-pay | Admitting: Physician Assistant

## 2019-12-23 ENCOUNTER — Other Ambulatory Visit: Payer: Self-pay | Admitting: Physician Assistant

## 2019-12-23 NOTE — Telephone Encounter (Signed)
Requested Prescriptions  Pending Prescriptions Disp Refills  . nortriptyline (PAMELOR) 50 MG capsule [Pharmacy Med Name: NORTRIPTYLINE HCL 50 MG CAP] 90 capsule 0    Sig: TAKE 1 CAPSULE BY MOUTH EVERY DAY     Psychiatry:  Antidepressants - Heterocyclics (TCAs) Passed - 12/23/2019 12:40 AM      Passed - Valid encounter within last 6 months    Recent Outpatient Visits          3 months ago Encounter for preoperative examination for general surgical procedure   [facility] [place], Alessandra Bevels, [place]   7 months ago Annual physical exam   [facility] [place], Alessandra Bevels, [place]   1 year ago Acute non-recurrent pansinusitis   [facility] [place], Alessandra Bevels, [place]   1 year ago Annual physical exam   [facility] [place], Alessandra Bevels, [place]   2 years ago Iron deficiency anemia secondary to inadequate dietary iron intake   [facility], Alessandra Bevels, [place]      Future Appointments            In 2 days Rosezetta Schlatter, Alessandra Bevels, PA-C [facility]

## 2019-12-25 ENCOUNTER — Other Ambulatory Visit: Payer: Self-pay

## 2019-12-25 ENCOUNTER — Ambulatory Visit (INDEPENDENT_AMBULATORY_CARE_PROVIDER_SITE_OTHER): Payer: PPO | Admitting: Physician Assistant

## 2019-12-25 ENCOUNTER — Encounter: Payer: Self-pay | Admitting: Physician Assistant

## 2019-12-25 VITALS — BP 135/79 | HR 75 | Temp 98.6°F | Resp 16 | Wt 145.8 lb

## 2019-12-25 DIAGNOSIS — F411 Generalized anxiety disorder: Secondary | ICD-10-CM | POA: Diagnosis not present

## 2019-12-25 DIAGNOSIS — M151 Heberden's nodes (with arthropathy): Secondary | ICD-10-CM | POA: Diagnosis not present

## 2019-12-25 DIAGNOSIS — N959 Unspecified menopausal and perimenopausal disorder: Secondary | ICD-10-CM

## 2019-12-25 MED ORDER — NORTRIPTYLINE HCL 50 MG PO CAPS: 50.0000 mg | ORAL_CAPSULE | Freq: Every day | ORAL | 0 refills | Status: DC

## 2019-12-25 MED ORDER — ESTRADIOL 0.5 MG PO TABS: 0.5000 mg | ORAL_TABLET | Freq: Every day | ORAL | 1 refills | Status: DC

## 2019-12-25 MED ORDER — ALPRAZOLAM 0.25 MG PO TABS: 0.1250 mg | ORAL_TABLET | Freq: Every evening | ORAL | 1 refills | Status: DC | PRN

## 2019-12-25 NOTE — Progress Notes (Signed)
Established patient visit   Patient: Jamie [name]   DOB: Oct 20, 1940   79 y.o. Female  MRN: [id number] Visit Date: 12/25/2019  Today's healthcare provider: Margaretann Loveless, PA-C   Chief Complaint  Patient presents with  . Follow-up   Subjective    HPI  Follow up for Depression/anxiety Patient stable. She is currently on Nortriptyline 50mg  and Xanax prn.  She reports excellent compliance with treatment. She feels that condition is Improved. She is not having side effects.  ----------------------------------------------------------------------------------------- She has redness, right middle finger that she is not sure what it is. It doesn't hurt is just red.  She noticed it a couple months ago and reports that is now spreading.  She had her flu shot 2-3 weeks at [facility] in [place].  Patient Active Problem List   Diagnosis Date Noted  . Cyst of ovary 05/03/2015  . Foot pain 11/22/2014  . Dysfunction of eustachian tube 11/20/2014  . Hypercholesteremia 11/20/2014  . Cardiac murmur 11/20/2014  . Temporary cerebral vascular dysfunction 11/20/2014  . Gout attack 11/20/2014  . Shortness of breath 11/20/2014  . Essential (primary) hypertension 11/20/2014  . L-S radiculopathy 09/27/2014  . Migraine 07/31/2014  . Hypothyroidism 07/31/2014  . Colon polyp 12/24/2009  . LBP (low back pain) 06/28/2009  . Anxiety state 10/26/2008  . Esophageal stenosis 10/26/2008  . Hemorrhoids, internal 10/26/2008  . Menopausal and perimenopausal disorder 10/26/2008  . Gastro-esophageal reflux disease without esophagitis 10/26/2008  . Edema 10/26/2008  . Allergic rhinitis 07/13/2006   Past Medical History:  Diagnosis Date  . Anemia   . Anxiety   . Arthritis   . GERD (gastroesophageal reflux disease)   . Headache   . Hypertension    patient denies  . Hypothyroidism   . Macular degeneration   . Ovarian cyst, left 2021  . PONV (postoperative nausea and vomiting)         Medications: Outpatient Medications Prior to Visit  Medication Sig  . Acetaminophen-Caffeine (TENSION HEADACHE) 500-65 MG TABS Take 2 tablets by mouth daily as needed (headaches).  . ALPRAZolam (XANAX) 0.25 MG tablet TAKE 1/2 TABLET BY MOUTH DAILY AS NEEDED (Patient taking differently: Take 0.125 mg by mouth at bedtime. )  . b complex vitamins tablet Take 1 tablet by mouth daily.  . cetirizine (ZYRTEC) 10 MG tablet Take 10 mg by mouth daily. As needed  . Cholecalciferol (VITAMIN D) 50 MCG (2000 UT) tablet Take 2,000 Units by mouth daily.  2022 esomeprazole (NEXIUM) 20 MG capsule Take 20 mg by mouth daily.  Marland Kitchen estradiol (ESTRACE) 0.5 MG tablet TAKE 1 TABLET BY MOUTH EVERY DAY  . meloxicam (MOBIC) 7.5 MG tablet TAKE 1 TABLET (7.5 MG TOTAL) BY MOUTH DAILY AS NEEDED.  Marland Kitchen MULTIPLE VITAMIN PO Take 1 tablet by mouth daily.   . Multiple Vitamins-Minerals (PRESERVISION AREDS 2 PO) Take 1 tablet by mouth 2 (two) times daily.  . nortriptyline (PAMELOR) 50 MG capsule TAKE 1 CAPSULE BY MOUTH EVERY DAY  . SYNTHROID 125 MCG tablet TAKE 1 TABLET BY MOUTH EVERY DAY  . tiZANidine (ZANAFLEX) 4 MG tablet TAKE 0.5-1 TABLETS (2-4 MG TOTAL) BY MOUTH EVERY 6 (SIX) HOURS AS NEEDED FOR MUSCLE SPASMS.  . vitamin E 1000 UNIT capsule Take 1,000 Units by mouth daily.  . Polyvinyl Alcohol-Povidone (REFRESH OP) Place 1 drop into both eyes daily as needed (dry eyes).   No facility-administered medications prior to visit.    Review of Systems  Constitutional: Negative.   Respiratory:  Negative.   Cardiovascular: Negative.   Musculoskeletal: Positive for arthralgias.  Neurological: Negative.   Psychiatric/Behavioral: Negative.     Last CBC Lab Results  Component Value Date   WBC 7.4 09/06/2019   HGB 13.3 09/06/2019   HCT 40.0 09/06/2019   MCV 93.9 09/06/2019   MCH 31.2 09/06/2019   RDW 13.5 09/06/2019   PLT 289 09/06/2019   Last metabolic panel Lab Results  Component Value Date   GLUCOSE 104 (H) 09/06/2019    NA 139 09/06/2019   K 3.9 09/06/2019   CL 105 09/06/2019   CO2 25 09/06/2019   BUN 19 09/06/2019   CREATININE 0.97 09/06/2019   GFRNONAA 56 (L) 09/06/2019   GFRAA >60 09/06/2019   CALCIUM 9.2 09/06/2019   PROT 6.6 06/08/2019   ALBUMIN 4.2 06/08/2019   LABGLOB 2.4 06/08/2019   AGRATIO 1.8 06/08/2019   BILITOT 0.2 06/08/2019   ALKPHOS 52 06/08/2019   AST 23 06/08/2019   ALT 17 06/08/2019   ANIONGAP 9 09/06/2019      Objective    BP 135/79 (BP Location: Left Arm, Patient Position: Sitting, Cuff Size: Normal)   Pulse 75   Temp 98.6 F (37 C) (Oral)   Resp 16   Wt 145 lb 12.8 oz (66.1 kg)   BMI 24.26 kg/m  BP Readings from Last 3 Encounters:  12/25/19 135/79  09/11/19 (!) 146/55  08/30/19 127/75   Wt Readings from Last 3 Encounters:  12/25/19 145 lb 12.8 oz (66.1 kg)  09/11/19 140 lb (63.5 kg)  08/31/19 140 lb (63.5 kg)      Physical Exam Vitals reviewed.  Constitutional:      General: She is not in acute distress.    Appearance: She is well-developed. She is not diaphoretic.  Neck:     Thyroid: No thyromegaly.     Vascular: No JVD.     Trachea: No tracheal deviation.  Cardiovascular:     Rate and Rhythm: Normal rate and regular rhythm.     Heart sounds: Normal heart sounds. No murmur heard.  No friction rub. No gallop.   Pulmonary:     Effort: Pulmonary effort is normal. No respiratory distress.     Breath sounds: Normal breath sounds. No wheezing or rales.  Musculoskeletal:     Right hand: Swelling, deformity (bony nodules on lateral edges of PIP joints on 2nd, 3rd and 4th joints; with 3rd being largest and red; also noted some twisting beginning at 2nd DIP joint), tenderness and bony tenderness present. Normal range of motion. Normal strength. Normal sensation. Normal capillary refill. Normal pulse.     Left hand: Normal.     Cervical back: Normal range of motion and neck supple.  Lymphadenopathy:     Cervical: No cervical adenopathy.       No  results found for any visits on 12/25/19.  Assessment & Plan     1. Anxiety state Stable. Diagnosis pulled for medication refill. Continue current medical treatment plan. - ALPRAZolam (XANAX) 0.25 MG tablet; Take 0.5 tablets (0.125 mg total) by mouth at bedtime as needed for anxiety.  Dispense: 45 tablet; Refill: 1 - nortriptyline (PAMELOR) 50 MG capsule; Take 1 capsule (50 mg total) by mouth at bedtime.  Dispense: 90 capsule; Refill: 0  2. Menopausal and perimenopausal disorder Stable. Diagnosis pulled for medication refill. Continue current medical treatment plan. - estradiol (ESTRACE) 0.5 MG tablet; Take 1 tablet (0.5 mg total) by mouth daily.  Dispense: 90 tablet; Refill: 1  3.  [place] nodes Noted on exam today. Discussed related with arthritis. Can use Tylenol when more tender. Can apply ice if very irritated and swollen.    No follow-ups on file.      Delmer Islam, PA-C, have reviewed all documentation for this visit. The documentation on 12/28/19 for the exam, diagnosis, procedures, and orders are all accurate and complete.   Reine Just  [facility] [phone] (phone) [phone] (fax)  [facility]

## 2019-12-25 NOTE — Patient Instructions (Signed)
Hand Exercises Hand exercises can be helpful for almost anyone. These exercises can strengthen the hands, improve flexibility and movement, and increase blood flow to the hands. These results can make work and daily tasks easier. Hand exercises can be especially helpful for people who have joint pain from arthritis or have nerve damage from overuse (carpal tunnel syndrome). These exercises can also help people who have injured a hand. Exercises Most of these hand exercises are gentle stretching and motion exercises. It is usually safe to do them often throughout the day. Warming up your hands before exercise may help to reduce stiffness. You can do this with gentle massage or by placing your hands in warm water for 10-15 minutes. It is normal to feel some stretching, pulling, tightness, or mild discomfort as you begin new exercises. This will gradually improve. Stop an exercise right away if you feel sudden, severe pain or your pain gets worse. Ask your health care provider which exercises are best for you. Knuckle bend or "claw" fist 1. Stand or sit with your arm, hand, and all five fingers pointed straight up. Make sure to keep your wrist straight during the exercise. 2. Gently bend your fingers down toward your palm until the tips of your fingers are touching the top of your palm. Keep your big knuckle straight and just bend the small knuckles in your fingers. 3. Hold this position for __________ seconds. 4. Straighten (extend) your fingers back to the starting position. Repeat this exercise 5-10 times with each hand. Full finger fist 1. Stand or sit with your arm, hand, and all five fingers pointed straight up. Make sure to keep your wrist straight during the exercise. 2. Gently bend your fingers into your palm until the tips of your fingers are touching the middle of your palm. 3. Hold this position for __________ seconds. 4. Extend your fingers back to the starting position, stretching every  joint fully. Repeat this exercise 5-10 times with each hand. Straight fist 1. Stand or sit with your arm, hand, and all five fingers pointed straight up. Make sure to keep your wrist straight during the exercise. 2. Gently bend your fingers at the big knuckle, where your fingers meet your hand, and the middle knuckle. Keep the knuckle at the tips of your fingers straight and try to touch the bottom of your palm. 3. Hold this position for __________ seconds. 4. Extend your fingers back to the starting position, stretching every joint fully. Repeat this exercise 5-10 times with each hand. Tabletop 1. Stand or sit with your arm, hand, and all five fingers pointed straight up. Make sure to keep your wrist straight during the exercise. 2. Gently bend your fingers at the big knuckle, where your fingers meet your hand, as far down as you can while keeping the small knuckles in your fingers straight. Think of forming a tabletop with your fingers. 3. Hold this position for __________ seconds. 4. Extend your fingers back to the starting position, stretching every joint fully. Repeat this exercise 5-10 times with each hand. Finger spread 1. Place your hand flat on a table with your palm facing down. Make sure your wrist stays straight as you do this exercise. 2. Spread your fingers and thumb apart from each other as far as you can until you feel a gentle stretch. Hold this position for __________ seconds. 3. Bring your fingers and thumb tight together again. Hold this position for __________ seconds. Repeat this exercise 5-10 times with each hand.   Making circles 1. Stand or sit with your arm, hand, and all five fingers pointed straight up. Make sure to keep your wrist straight during the exercise. 2. Make a circle by touching the tip of your thumb to the tip of your index finger. 3. Hold for __________ seconds. Then open your hand wide. 4. Repeat this motion with your thumb and each finger on your  hand. Repeat this exercise 5-10 times with each hand. Thumb motion 1. Sit with your forearm resting on a table and your wrist straight. Your thumb should be facing up toward the ceiling. Keep your fingers relaxed as you move your thumb. 2. Lift your thumb up as high as you can toward the ceiling. Hold for __________ seconds. 3. Bend your thumb across your palm as far as you can, reaching the tip of your thumb for the small finger (pinkie) side of your palm. Hold for __________ seconds. Repeat this exercise 5-10 times with each hand. Grip strengthening  1. Hold a stress ball or other soft ball in the middle of your hand. 2. Slowly increase the pressure, squeezing the ball as much as you can without causing pain. Think of bringing the tips of your fingers into the middle of your palm. All of your finger joints should bend when doing this exercise. 3. Hold your squeeze for __________ seconds, then relax. Repeat this exercise 5-10 times with each hand. Contact a health care provider if:  Your hand pain or discomfort gets much worse when you do an exercise.  Your hand pain or discomfort does not improve within 2 hours after you exercise. If you have any of these problems, stop doing these exercises right away. Do not do them again unless your health care provider says that you can. Get help right away if:  You develop sudden, severe hand pain or swelling. If this happens, stop doing these exercises right away. Do not do them again unless your health care provider says that you can. This information is not intended to replace advice given to you by your health care provider. Make sure you discuss any questions you have with your health care provider. Document Revised: 06/02/2018 Document Reviewed: 02/10/2018 Elsevier Patient Education  2020 [employer]  

## 2020-01-13 ENCOUNTER — Other Ambulatory Visit: Payer: Self-pay | Admitting: Physician Assistant

## 2020-01-13 DIAGNOSIS — M62838 Other muscle spasm: Secondary | ICD-10-CM

## 2020-01-13 NOTE — Telephone Encounter (Signed)
Requested medication (s) are due for refill today: yes  Requested medication (s) are on the active medication list:yes  Last refill: 04/03/19  #90  1 refill  Future visit scheduled  yes   05/24/20  Notes to clinic: not delegated  Requested Prescriptions  Pending Prescriptions Disp Refills   tiZANidine (ZANAFLEX) 4 MG tablet [Pharmacy Med Name: TIZANIDINE HCL 4 MG TABLET] 90 tablet 1    Sig: TAKE 0.5-1 TABLETS (2-4 MG TOTAL) BY MOUTH EVERY 6 (SIX) HOURS AS NEEDED FOR MUSCLE SPASMS.      Not Delegated - Cardiovascular:  Alpha-2 Agonists - tizanidine Failed - 01/13/2020 12:44 AM      Failed - This refill cannot be delegated      Passed - Valid encounter within last 6 months    Recent Outpatient Visits           2 weeks ago Anxiety state   [facility] [place], Alessandra Bevels, [place]   4 months ago Encounter for preoperative examination for general surgical procedure   [facility] [place], Alessandra Bevels, [place]   7 months ago Annual physical exam   [facility] [place], Alessandra Bevels, [place]   1 year ago Acute non-recurrent pansinusitis   [facility] [place], Alessandra Bevels, [place]   1 year ago Annual physical exam   [facility] [place], Alessandra Bevels, [place]       Future Appointments             In 4 months [place], Alessandra Bevels, PA-C [facility]

## 2020-01-31 DIAGNOSIS — H353211 Exudative age-related macular degeneration, right eye, with active choroidal neovascularization: Secondary | ICD-10-CM | POA: Diagnosis not present

## 2020-03-27 DIAGNOSIS — H353221 Exudative age-related macular degeneration, left eye, with active choroidal neovascularization: Secondary | ICD-10-CM | POA: Diagnosis not present

## 2020-03-27 DIAGNOSIS — H353231 Exudative age-related macular degeneration, bilateral, with active choroidal neovascularization: Secondary | ICD-10-CM | POA: Diagnosis not present

## 2020-03-27 DIAGNOSIS — H43823 Vitreomacular adhesion, bilateral: Secondary | ICD-10-CM | POA: Diagnosis not present

## 2020-04-29 ENCOUNTER — Other Ambulatory Visit: Payer: Self-pay | Admitting: Physician Assistant

## 2020-04-29 DIAGNOSIS — F411 Generalized anxiety disorder: Secondary | ICD-10-CM

## 2020-04-29 DIAGNOSIS — G8929 Other chronic pain: Secondary | ICD-10-CM

## 2020-04-29 DIAGNOSIS — N959 Unspecified menopausal and perimenopausal disorder: Secondary | ICD-10-CM

## 2020-05-08 DIAGNOSIS — H353231 Exudative age-related macular degeneration, bilateral, with active choroidal neovascularization: Secondary | ICD-10-CM | POA: Diagnosis not present

## 2020-05-24 ENCOUNTER — Ambulatory Visit (INDEPENDENT_AMBULATORY_CARE_PROVIDER_SITE_OTHER): Payer: PPO | Admitting: Physician Assistant

## 2020-05-24 ENCOUNTER — Encounter: Payer: Self-pay | Admitting: Physician Assistant

## 2020-05-24 ENCOUNTER — Other Ambulatory Visit: Payer: Self-pay

## 2020-05-24 VITALS — BP 134/71 | HR 84 | Temp 98.2°F | Ht 65.0 in | Wt 144.3 lb

## 2020-05-24 DIAGNOSIS — Z1239 Encounter for other screening for malignant neoplasm of breast: Secondary | ICD-10-CM | POA: Diagnosis not present

## 2020-05-24 DIAGNOSIS — M62838 Other muscle spasm: Secondary | ICD-10-CM | POA: Diagnosis not present

## 2020-05-24 DIAGNOSIS — I10 Essential (primary) hypertension: Secondary | ICD-10-CM | POA: Diagnosis not present

## 2020-05-24 DIAGNOSIS — E7800 Pure hypercholesterolemia, unspecified: Secondary | ICD-10-CM | POA: Diagnosis not present

## 2020-05-24 DIAGNOSIS — M5442 Lumbago with sciatica, left side: Secondary | ICD-10-CM

## 2020-05-24 DIAGNOSIS — N959 Unspecified menopausal and perimenopausal disorder: Secondary | ICD-10-CM | POA: Diagnosis not present

## 2020-05-24 DIAGNOSIS — M7989 Other specified soft tissue disorders: Secondary | ICD-10-CM | POA: Diagnosis not present

## 2020-05-24 DIAGNOSIS — G8929 Other chronic pain: Secondary | ICD-10-CM | POA: Diagnosis not present

## 2020-05-24 DIAGNOSIS — F411 Generalized anxiety disorder: Secondary | ICD-10-CM | POA: Diagnosis not present

## 2020-05-24 DIAGNOSIS — Z0000 Encounter for general adult medical examination without abnormal findings: Secondary | ICD-10-CM | POA: Diagnosis not present

## 2020-05-24 DIAGNOSIS — L2084 Intrinsic (allergic) eczema: Secondary | ICD-10-CM | POA: Diagnosis not present

## 2020-05-24 DIAGNOSIS — E034 Atrophy of thyroid (acquired): Secondary | ICD-10-CM | POA: Diagnosis not present

## 2020-05-24 MED ORDER — ALPRAZOLAM 0.25 MG PO TABS: 0.1250 mg | ORAL_TABLET | Freq: Every evening | ORAL | 1 refills | Status: DC | PRN

## 2020-05-24 MED ORDER — TIZANIDINE HCL 4 MG PO TABS: 2.0000 mg | ORAL_TABLET | Freq: Four times a day (QID) | ORAL | 1 refills | Status: DC | PRN

## 2020-05-24 MED ORDER — NORTRIPTYLINE HCL 50 MG PO CAPS: 50.0000 mg | ORAL_CAPSULE | Freq: Every day | ORAL | 3 refills | Status: DC

## 2020-05-24 MED ORDER — TRIAMCINOLONE ACETONIDE 0.1 % EX CREA: 1.0000 "application " | TOPICAL_CREAM | Freq: Two times a day (BID) | CUTANEOUS | 0 refills | Status: DC

## 2020-05-24 MED ORDER — MELOXICAM 7.5 MG PO TABS: 7.5000 mg | ORAL_TABLET | Freq: Every day | ORAL | 1 refills | Status: DC | PRN

## 2020-05-24 MED ORDER — ESTRADIOL 0.5 MG PO TABS: 0.5000 mg | ORAL_TABLET | Freq: Every day | ORAL | 1 refills | Status: DC

## 2020-05-24 MED ORDER — FUROSEMIDE 20 MG PO TABS: 20.0000 mg | ORAL_TABLET | Freq: Every day | ORAL | 3 refills | Status: DC

## 2020-05-24 NOTE — Progress Notes (Signed)
Complete physical exam   Patient: Jamie [name]   DOB: 1941/01/14   80 y.o. Female  MRN: [id number] Visit Date: 05/24/2020  Today's healthcare provider: Margaretann Loveless, PA-C   Chief Complaint  Patient presents with  . Annual Exam  . Anxiety   Subjective    Jamie [name] is a 80 y.o. female who presents today for a complete physical exam.  She reports consuming a general and low sodium diet. Home exercise routine includes walking. She generally feels well. She reports sleeping well. She does have additional problems to discuss today.  HPI  Anxiety, Follow-up  She was last seen for anxiety 5 months ago. Changes made at last visit include continue current medication.   She reports good compliance with treatment. She reports good tolerance of treatment. She is not having side effects.   She feels her anxiety is mild and Improved since last visit.  Symptoms: No chest pain No difficulty concentrating  No dizziness No fatigue  No feelings of losing control No insomnia  No irritable No palpitations  No panic attacks No racing thoughts  No shortness of breath No sweating  No tremors/shakes    GAD-7 Results No flowsheet data found.  PHQ-9 Scores PHQ9 SCORE ONLY 05/24/2020 12/25/2019 08/30/2019  PHQ-9 Total Score 0 0 0    ---------------------------------------------------------------------------------------------------   Past Medical History:  Diagnosis Date  . Anemia   . Anxiety   . Arthritis   . GERD (gastroesophageal reflux disease)   . Headache   . Hypertension    patient denies  . Hypothyroidism   . Macular degeneration   . Ovarian cyst, left 2021  . PONV (postoperative nausea and vomiting)    Past Surgical History:  Procedure Laterality Date  . ABDOMINAL HYSTERECTOMY  1980  . APPENDECTOMY    . CATARACT EXTRACTION Bilateral 2014  . CHOLECYSTECTOMY    . COLONOSCOPY WITH PROPOFOL N/A 03/22/2015   Procedure: COLONOSCOPY WITH PROPOFOL;  Surgeon:  Scot Jun, MD;  Location: [facility] ENDOSCOPY;  Service: Endoscopy;  Laterality: N/A;  . EYE SURGERY    . LAPAROSCOPIC BILATERAL SALPINGO OOPHERECTOMY Bilateral 09/11/2019   Procedure: LAPAROSCOPIC BILATERAL SALPINGO OOPHORECTOMY;  Surgeon: Schermerhorn, Ihor Austin, MD;  Location: [facility] ORS;  Service: Gynecology;  Laterality: Bilateral;  . TUBAL LIGATION     Social History   Socioeconomic History  . Marital status: Married    Spouse name: Not on file  . Number of children: 2  . Years of education: Not on file  . Highest education level: High school graduate  Occupational History  . Occupation: retired  Tobacco Use  . Smoking status: Never Smoker  . Smokeless tobacco: Never Used  Vaping Use  . Vaping Use: Never used  Substance and Sexual Activity  . Alcohol use: No  . Drug use: No  . Sexual activity: Not on file  Other Topics Concern  . Not on file  Social History Narrative  . Not on file   Social Determinants of Health   Financial Resource Strain: Low Risk   . Difficulty of Paying Living Expenses: Not hard at all  Food Insecurity: No Food Insecurity  . Worried About Programme researcher, broadcasting/film/video in the Last Year: Never true  . Ran Out of Food in the Last Year: Never true  Transportation Needs: No Transportation Needs  . Lack of Transportation (Medical): No  . Lack of Transportation (Non-Medical): No  Physical Activity: Sufficiently Active  . Days of Exercise per  Week: 6 days  . Minutes of Exercise per Session: 30 min  Stress: No Stress Concern Present  . Feeling of Stress : Not at all  Social Connections: Moderately Integrated  . Frequency of Communication with Friends and Family: Three times a week  . Frequency of Social Gatherings with Friends and Family: Three times a week  . Attends Religious Services: More than 4 times per year  . Active Member of Clubs or Organizations: No  . Attends Banker Meetings: Never  . Marital Status: Married  Catering manager  Violence: Not At Risk  . Fear of Current or Ex-Partner: No  . Emotionally Abused: No  . Physically Abused: No  . Sexually Abused: No   Family Status  Relation Name Status  . Mother  Deceased at age 54       old age  . Father  Deceased at age 55       MI  . Sister  Alive  . Brother  Alive  . MGM  (Not Specified)  . Neg Hx  (Not Specified)   Family History  Problem Relation Age of Onset  . Hyperlipidemia Mother   . Heart attack Father   . Emphysema Father   . Healthy Sister   . Diabetes Brother   . Lymphoma Brother   . Stroke Maternal Grandmother   . Breast cancer Neg Hx    Allergies  Allergen Reactions  . Codeine     Stomach pain  . Penicillins Rash    Patient Care Team: Reine Just as PCP - General (Family Medicine) Pa, [facility]   Medications: Outpatient Medications Prior to Visit  Medication Sig  . Acetaminophen-Caffeine (TENSION HEADACHE) 500-65 MG TABS Take 2 tablets by mouth daily as needed (headaches).  . ALPRAZolam (XANAX) 0.25 MG tablet Take 0.5 tablets (0.125 mg total) by mouth at bedtime as needed for anxiety.  Marland Kitchen b complex vitamins tablet Take 1 tablet by mouth daily.  . cetirizine (ZYRTEC) 10 MG tablet Take 10 mg by mouth daily. As needed  . Cholecalciferol (VITAMIN D) 50 MCG (2000 UT) tablet Take 2,000 Units by mouth daily.  Marland Kitchen esomeprazole (NEXIUM) 20 MG capsule Take 20 mg by mouth daily.  Marland Kitchen estradiol (ESTRACE) 0.5 MG tablet TAKE 1 TABLET (0.5 MG TOTAL) BY MOUTH DAILY.  . meloxicam (MOBIC) 7.5 MG tablet TAKE 1 TABLET (7.5 MG TOTAL) BY MOUTH DAILY AS NEEDED.  Marland Kitchen MULTIPLE VITAMIN PO Take 1 tablet by mouth daily.   . Multiple Vitamins-Minerals (PRESERVISION AREDS 2 PO) Take 1 tablet by mouth 2 (two) times daily.  . nortriptyline (PAMELOR) 50 MG capsule TAKE 1 CAPSULE BY MOUTH AT BEDTIME.  . SYNTHROID 125 MCG tablet TAKE 1 TABLET BY MOUTH EVERY DAY  . tiZANidine (ZANAFLEX) 4 MG tablet TAKE 0.5-1 TABLETS (2-4 MG TOTAL) BY MOUTH  EVERY 6 (SIX) HOURS AS NEEDED FOR MUSCLE SPASMS.  . vitamin E 1000 UNIT capsule Take 1,000 Units by mouth daily.   No facility-administered medications prior to visit.    Review of Systems  Constitutional: Negative.   HENT: Negative.   Eyes: Negative.   Respiratory: Negative.   Cardiovascular: Negative.   Gastrointestinal: Negative.   Endocrine: Negative.   Genitourinary: Negative.   Musculoskeletal: Positive for arthralgias, back pain and neck pain.  Skin: Negative.   Allergic/Immunologic: Negative.   Neurological: Negative.   Hematological: Negative.   Psychiatric/Behavioral: Negative.     Last CBC Lab Results  Component Value Date  WBC 6.8 05/28/2020   HGB 14.6 05/28/2020   HCT 43.8 05/28/2020   MCV 94 05/28/2020   MCH 31.4 05/28/2020   RDW 11.9 05/28/2020   PLT 297 05/28/2020   Last metabolic panel Lab Results  Component Value Date   GLUCOSE 97 05/28/2020   NA 142 05/28/2020   K 4.7 05/28/2020   CL 100 05/28/2020   CO2 25 05/28/2020   BUN 15 05/28/2020   CREATININE 0.93 05/28/2020   GFRNONAA 56 (L) 09/06/2019   GFRAA >60 09/06/2019   CALCIUM 9.6 05/28/2020   PROT 7.0 05/28/2020   ALBUMIN 4.5 05/28/2020   LABGLOB 2.5 05/28/2020   AGRATIO 1.8 05/28/2020   BILITOT 0.2 05/28/2020   ALKPHOS 52 05/28/2020   AST 26 05/28/2020   ALT 22 05/28/2020   ANIONGAP 9 09/06/2019      Objective    BP 134/71 (BP Location: Left Arm, Patient Position: Sitting, Cuff Size: Normal)   Pulse 84   Temp 98.2 F (36.8 C) (Oral)   Ht 5\' 5"  (1.651 m)   Wt 144 lb 4.8 oz (65.5 kg)   SpO2 100%   BMI 24.01 kg/m  BP Readings from Last 3 Encounters:  05/24/20 134/71  12/25/19 135/79  09/11/19 (!) 146/55   Wt Readings from Last 3 Encounters:  05/24/20 144 lb 4.8 oz (65.5 kg)  12/25/19 145 lb 12.8 oz (66.1 kg)  09/11/19 140 lb (63.5 kg)      Physical Exam Vitals reviewed.  Constitutional:      General: She is not in acute distress.    Appearance: Normal appearance.  She is well-developed and normal weight. She is not ill-appearing or diaphoretic.  HENT:     Head: Normocephalic and atraumatic.     Right Ear: Tympanic membrane, ear canal and external ear normal.     Left Ear: Tympanic membrane, ear canal and external ear normal.     Nose: Nose normal.     Mouth/Throat:     Mouth: Mucous membranes are moist.     Pharynx: Oropharynx is clear. No oropharyngeal exudate.  Eyes:     General: No scleral icterus.       Right eye: No discharge.        Left eye: No discharge.     Extraocular Movements: Extraocular movements intact.     Conjunctiva/sclera: Conjunctivae normal.     Pupils: Pupils are equal, round, and reactive to light.  Neck:     Thyroid: No thyromegaly.     Vascular: No carotid bruit or JVD.     Trachea: No tracheal deviation.  Cardiovascular:     Rate and Rhythm: Normal rate and regular rhythm.     Pulses: Normal pulses.     Heart sounds: Normal heart sounds. No murmur heard. No friction rub. No gallop.   Pulmonary:     Effort: Pulmonary effort is normal. No respiratory distress.     Breath sounds: Normal breath sounds. No wheezing or rales.  Chest:     Chest wall: No tenderness.  Abdominal:     General: Abdomen is flat. Bowel sounds are normal. There is no distension.     Palpations: Abdomen is soft. There is no mass.     Tenderness: There is no abdominal tenderness. There is no guarding or rebound.  Musculoskeletal:        General: No tenderness. Normal range of motion.     Cervical back: Normal range of motion and neck supple. No tenderness.  Right lower leg: No edema.     Left lower leg: No edema.  Lymphadenopathy:     Cervical: No cervical adenopathy.  Skin:    General: Skin is warm and dry.     Capillary Refill: Capillary refill takes less than 2 seconds.     Findings: No rash.  Neurological:     General: No focal deficit present.     Mental Status: She is alert and oriented to person, place, and time. Mental status  is at baseline.  Psychiatric:        Mood and Affect: Mood normal.        Behavior: Behavior normal.        Thought Content: Thought content normal.        Judgment: Judgment normal.      Last depression screening scores PHQ 2/9 Scores 05/24/2020 12/25/2019 08/30/2019  PHQ - 2 Score 0 0 0  PHQ- 9 Score 0 - -  Exception Documentation - - -   Last fall risk screening Fall Risk  05/24/2020  Falls in the past year? 0  Comment -  Number falls in past yr: 0  Injury with Fall? 0  Risk for fall due to : No Fall Risks  Follow up Falls evaluation completed   Last Audit-C alcohol use screening Alcohol Use Disorder Test (AUDIT) 05/24/2020  1. How often do you have a drink containing alcohol? 0  2. How many drinks containing alcohol do you have on a typical day when you are drinking? 0  3. How often do you have six or more drinks on one occasion? 0  AUDIT-C Score 0  Alcohol Brief Interventions/Follow-up -   A score of 3 or more in women, and 4 or more in men indicates increased risk for alcohol abuse, EXCEPT if all of the points are from question 1   No results found for any visits on 05/24/20.  Assessment & Plan    Routine Health Maintenance and Physical Exam  Exercise Activities and Dietary recommendations Goals    . DIET - INCREASE WATER INTAKE     Recommend increasing water intake to 6-8 glasses a day.        Immunization History  Administered Date(s) Administered  . Influenza, High Dose Seasonal PF 01/03/2015, 12/12/2015, 12/03/2016, 12/15/2017  . Influenza-Unspecified 10/24/2013, 11/11/2018  . PFIZER(Purple Top)SARS-COV-2 Vaccination 04/26/2019, 05/17/2019  . Pneumococcal Conjugate-13 01/10/2014  . Pneumococcal Polysaccharide-23 05/15/2010    Health Maintenance  Topic Date Due  . COVID-19 Vaccine (3 - Booster for Pfizer series) 11/17/2019  . TETANUS/TDAP  09/26/2020 (Originally 01/17/1960)  . INFLUENZA VACCINE  09/23/2020  . DEXA SCAN  03/11/2022  . PNA vac Low Risk  Adult  Completed  . HPV VACCINES  Aged Out    Discussed health benefits of physical activity, and encouraged her to engage in regular exercise appropriate for her age and condition.  1. Annual physical exam Normal physical exam today. Will check labs as below and f/u pending lab results. If labs are stable and WNL she will not need to have these rechecked for one year at her next annual physical exam. She is to call the office in the meantime if she has any acute issue, questions or concerns. - TSH - Lipid panel - Comprehensive metabolic panel - CBC with Differential/Platelet  2. Encounter for breast cancer screening using non-mammogram modality Breast exam today was normal. There is no family history of breast cancer. She does perform regular self breast exams. Mammogram was  ordered as below. Information for [facility] Breast clinic was given to patient so she may schedule her mammogram at her convenience. - MM 3D SCREEN BREAST BILATERAL; Future  3. Essential (primary) hypertension Stable. Will check labs as below and f/u pending results. - TSH - Lipid panel - Comprehensive metabolic panel - CBC with Differential/Platelet  4. Hypothyroidism due to acquired atrophy of thyroid Stable. Will check labs as below and f/u pending results. - TSH  5. Anxiety state Stable. Diagnosis pulled for medication refill. Continue current medical treatment plan. - nortriptyline (PAMELOR) 50 MG capsule; Take 1 capsule (50 mg total) by mouth at bedtime.  Dispense: 90 capsule; Refill: 3 - ALPRAZolam (XANAX) 0.25 MG tablet; Take 0.5 tablets (0.125 mg total) by mouth at bedtime as needed for anxiety.  Dispense: 45 tablet; Refill: 1  6. Hypercholesteremia Stable. Will check labs as below and f/u pending results. - Lipid panel - Comprehensive metabolic panel - CBC with Differential/Platelet  7. Leg swelling Stable. Diagnosis pulled for medication refill. Continue current medical treatment plan. -  furosemide (LASIX) 20 MG tablet; Take 1 tablet (20 mg total) by mouth daily.  Dispense: 30 tablet; Refill: 3  8. Menopausal and perimenopausal disorder Stable. Diagnosis pulled for medication refill. Continue current medical treatment plan. - estradiol (ESTRACE) 0.5 MG tablet; Take 1 tablet (0.5 mg total) by mouth daily.  Dispense: 90 tablet; Refill: 1  9. Chronic bilateral low back pain with left-sided sciatica Stable. Diagnosis pulled for medication refill. Continue current medical treatment plan. - meloxicam (MOBIC) 7.5 MG tablet; Take 1 tablet (7.5 mg total) by mouth daily as needed.  Dispense: 90 tablet; Refill: 1  10. Muscle spasm Stable. Diagnosis pulled for medication refill. Continue current medical treatment plan.' - tiZANidine (ZANAFLEX) 4 MG tablet; Take 0.5-1 tablets (2-4 mg total) by mouth every 6 (six) hours as needed for muscle spasms.  Dispense: 90 tablet; Refill: 1  11. Intrinsic eczema Stable. Diagnosis pulled for medication refill. Continue current medical treatment plan. - triamcinolone (KENALOG) 0.1 %; Apply 1 application topically 2 (two) times daily.  Dispense: 80 g; Refill: 0   No follow-ups on file.     Delmer Islam, PA-C, have reviewed all documentation for this visit. The documentation on 06/02/20 for the exam, diagnosis, procedures, and orders are all accurate and complete.   Reine Just  [facility] [phone] (phone) [phone] (fax)  [facility]

## 2020-05-24 NOTE — Patient Instructions (Signed)
[facility] [address] [place],  [place]  [address] Main: [phone]   Preventive Care 65 Years and Older, Female Preventive care refers to lifestyle choices and visits with your health care provider that can promote health and wellness. This includes:  A yearly physical exam. This is also called an annual wellness visit.  Regular dental and eye exams.  Immunizations.  Screening for certain conditions.  Healthy lifestyle choices, such as: ? Eating a healthy diet. ? Getting regular exercise. ? Not using drugs or products that contain nicotine and tobacco. ? Limiting alcohol use. What can I expect for my preventive care visit? Physical exam Your health care provider will check your:  Height and weight. These may be used to calculate your BMI (body mass index). BMI is a measurement that tells if you are at a healthy weight.  Heart rate and blood pressure.  Body temperature.  Skin for abnormal spots. Counseling Your health care provider may ask you questions about your:  Past medical problems.  Family's medical history.  Alcohol, tobacco, and drug use.  Emotional well-being.  Home life and relationship well-being.  Sexual activity.  Diet, exercise, and sleep habits.  History of falls.  Memory and ability to understand (cognition).  Work and work environment.  Pregnancy and menstrual history.  Access to firearms. What immunizations do I need? Vaccines are usually given at various ages, according to a schedule. Your health care provider will recommend vaccines for you based on your age, medical history, and lifestyle or other factors, such as travel or where you work.   What tests do I need? Blood tests  Lipid and cholesterol levels. These may be checked every 5 years, or more often depending on your overall health.  Hepatitis C test.  Hepatitis B test. Screening  Lung cancer screening. You may have this  screening every year starting at age 55 if you have a 30-pack-year history of smoking and currently smoke or have quit within the past 15 years.  Colorectal cancer screening. ? All adults should have this screening starting at age 50 and continuing until age 75. ? Your health care provider may recommend screening at age 45 if you are at increased risk. ? You will have tests every 1-10 years, depending on your results and the type of screening test.  Diabetes screening. ? This is done by checking your blood sugar (glucose) after you have not eaten for a while (fasting). ? You may have this done every 1-3 years.  Mammogram. ? This may be done every 1-2 years. ? Talk with your health care provider about how often you should have regular mammograms.  Abdominal aortic aneurysm (AAA) screening. You may need this if you are a current or former smoker.  BRCA-related cancer screening. This may be done if you have a family history of breast, ovarian, tubal, or peritoneal cancers. Other tests  STD (sexually transmitted disease) testing, if you are at risk.  Bone density scan. This is done to screen for osteoporosis. You may have this done starting at age 65. Talk with your health care provider about your test results, treatment options, and if necessary, the need for more tests. Follow these instructions at home: Eating and drinking  Eat a diet that includes fresh fruits and vegetables, whole grains, lean protein, and low-fat dairy products. Limit your intake of foods with high amounts of sugar, saturated fats, and salt.  Take vitamin and mineral supplements as recommended by   your health care provider.  Do not drink alcohol if your health care provider tells you not to drink.  If you drink alcohol: ? Limit how much you have to 0-1 drink a day. ? Be aware of how much alcohol is in your drink. In the [place] one drink equals one 12 oz bottle of beer (355 mL), one 5 oz glass of wine (148 mL), or  one 1 oz glass of hard liquor (44 mL).   Lifestyle  Take daily care of your teeth and gums. Brush your teeth every morning and night with fluoride toothpaste. Floss one time each day.  Stay active. Exercise for at least 30 minutes 5 or more days each week.  Do not use any products that contain nicotine or tobacco, such as cigarettes, e-cigarettes, and chewing tobacco. If you need help quitting, ask your health care provider.  Do not use drugs.  If you are sexually active, practice safe sex. Use a condom or other form of protection in order to prevent STIs (sexually transmitted infections).  Talk with your health care provider about taking a low-dose aspirin or statin.  Find healthy ways to cope with stress, such as: ? Meditation, yoga, or listening to music. ? Journaling. ? Talking to a trusted person. ? Spending time with friends and family. Safety  Always wear your seat belt while driving or riding in a vehicle.  Do not drive: ? If you have been drinking alcohol. Do not ride with someone who has been drinking. ? When you are tired or distracted. ? While texting.  Wear a helmet and other protective equipment during sports activities.  If you have firearms in your house, make sure you follow all gun safety procedures. What's next?  Visit your health care provider once a year for an annual wellness visit.  Ask your health care provider how often you should have your eyes and teeth checked.  Stay up to date on all vaccines. This information is not intended to replace advice given to you by your health care provider. Make sure you discuss any questions you have with your health care provider. Document Revised: 01/31/2020 Document Reviewed: 02/03/2018 Elsevier Patient Education  2021 Elsevier Inc.   

## 2020-05-28 DIAGNOSIS — E7800 Pure hypercholesterolemia, unspecified: Secondary | ICD-10-CM | POA: Diagnosis not present

## 2020-05-28 DIAGNOSIS — I10 Essential (primary) hypertension: Secondary | ICD-10-CM | POA: Diagnosis not present

## 2020-05-28 DIAGNOSIS — Z0000 Encounter for general adult medical examination without abnormal findings: Secondary | ICD-10-CM | POA: Diagnosis not present

## 2020-05-28 DIAGNOSIS — E034 Atrophy of thyroid (acquired): Secondary | ICD-10-CM | POA: Diagnosis not present

## 2020-05-29 LAB — COMPREHENSIVE METABOLIC PANEL
ALT: 22 IU/L (ref 0–32)
AST: 26 IU/L (ref 0–40)
Albumin/Globulin Ratio: 1.8 (ref 1.2–2.2)
Albumin: 4.5 g/dL (ref 3.7–4.7)
Alkaline Phosphatase: 52 IU/L (ref 44–121)
BUN/Creatinine Ratio: 16 (ref 12–28)
BUN: 15 mg/dL (ref 8–27)
Bilirubin Total: 0.2 mg/dL (ref 0.0–1.2)
CO2: 25 mmol/L (ref 20–29)
Calcium: 9.6 mg/dL (ref 8.7–10.3)
Chloride: 100 mmol/L (ref 96–106)
Creatinine, Ser: 0.93 mg/dL (ref 0.57–1.00)
Globulin, Total: 2.5 g/dL (ref 1.5–4.5)
Glucose: 97 mg/dL (ref 65–99)
Potassium: 4.7 mmol/L (ref 3.5–5.2)
Sodium: 142 mmol/L (ref 134–144)
Total Protein: 7 g/dL (ref 6.0–8.5)
eGFR: 63 mL/min/{1.73_m2} (ref >59)

## 2020-05-29 LAB — CBC WITH DIFFERENTIAL/PLATELET
Basophils Absolute: 0 10*3/uL (ref 0.0–0.2)
Basos: 0 %
EOS (ABSOLUTE): 0.4 10*3/uL (ref 0.0–0.4)
Eos: 5 %
Hematocrit: 43.8 % (ref 34.0–46.6)
Hemoglobin: 14.6 g/dL (ref 11.1–15.9)
Immature Grans (Abs): 0 10*3/uL (ref 0.0–0.1)
Immature Granulocytes: 0 %
Lymphocytes Absolute: 1.6 10*3/uL (ref 0.7–3.1)
Lymphs: 23 %
MCH: 31.4 pg (ref 26.6–33.0)
MCHC: 33.3 g/dL (ref 31.5–35.7)
MCV: 94 fL (ref 79–97)
Monocytes Absolute: 0.7 10*3/uL (ref 0.1–0.9)
Monocytes: 10 %
Neutrophils Absolute: 4.1 10*3/uL (ref 1.4–7.0)
Neutrophils: 62 %
Platelets: 297 10*3/uL (ref 150–450)
RBC: 4.65 x10E6/uL (ref 3.77–5.28)
RDW: 11.9 % (ref 11.7–15.4)
WBC: 6.8 10*3/uL (ref 3.4–10.8)

## 2020-05-29 LAB — LIPID PANEL
Chol/HDL Ratio: 2.9 ratio (ref 0.0–4.4)
Cholesterol, Total: 230 mg/dL — ABNORMAL HIGH (ref 100–199)
HDL: 78 mg/dL (ref >39)
LDL Chol Calc (NIH): 122 mg/dL — ABNORMAL HIGH (ref 0–99)
Triglycerides: 176 mg/dL — ABNORMAL HIGH (ref 0–149)
VLDL Cholesterol Cal: 30 mg/dL (ref 5–40)

## 2020-05-29 LAB — TSH: TSH: 13 u[IU]/mL — ABNORMAL HIGH (ref 0.450–4.500)

## 2020-06-02 ENCOUNTER — Encounter: Payer: Self-pay | Admitting: Physician Assistant

## 2020-06-02 DIAGNOSIS — E034 Atrophy of thyroid (acquired): Secondary | ICD-10-CM

## 2020-06-02 DIAGNOSIS — E7800 Pure hypercholesterolemia, unspecified: Secondary | ICD-10-CM

## 2020-06-03 MED ORDER — LEVOTHYROXINE SODIUM 137 MCG PO TABS: 137.0000 ug | ORAL_TABLET | Freq: Every day | ORAL | 1 refills | Status: DC

## 2020-06-03 MED ORDER — ATORVASTATIN CALCIUM 10 MG PO TABS: 10.0000 mg | ORAL_TABLET | Freq: Every day | ORAL | 3 refills | Status: DC

## 2020-06-19 DIAGNOSIS — L308 Other specified dermatitis: Secondary | ICD-10-CM | POA: Diagnosis not present

## 2020-07-15 ENCOUNTER — Telehealth: Payer: Self-pay

## 2020-07-15 NOTE — Telephone Encounter (Signed)
Copied from CRM [id number]. Topic: General - Other >> Jul 15, 2020 11:01 AM Daphine Deutscher D wrote: Reason for CRM: Pt called saying she can not take the Atorvastatin.  She said it is causing stomach  issues and her just not feeling well in general.  She stopped taking it a week ago and wants to let the doctor know   CB# [phone]

## 2020-07-15 NOTE — Telephone Encounter (Signed)
Noted.  Can discuss at next visit or sooner if she wishes to schedule another office visit with any provider in the office

## 2020-07-15 NOTE — Telephone Encounter (Signed)
Patient advised. She will discuss at next visit.

## 2020-07-17 DIAGNOSIS — H353231 Exudative age-related macular degeneration, bilateral, with active choroidal neovascularization: Secondary | ICD-10-CM | POA: Diagnosis not present

## 2020-07-30 ENCOUNTER — Telehealth: Payer: Self-pay

## 2020-07-30 NOTE — Telephone Encounter (Signed)
Copied from CRM [id number]. Topic: General - Other >> Jul 30, 2020  3:42 PM Aretta Nip wrote: Reason for CRM:Pls fu with pt / pt had last appt with Antony Contras and had requested Mammo order but somehow was missed as when she called [facility] order not placed. Last Mammo due to covid was in 2020. Nxt appt with Dr B in Nov. Therefore pt wishes for another provider to place order for her at [facility].  Fu at [phone]

## 2020-07-31 ENCOUNTER — Other Ambulatory Visit: Payer: Self-pay | Admitting: *Deleted

## 2020-07-31 DIAGNOSIS — Z1231 Encounter for screening mammogram for malignant neoplasm of breast: Secondary | ICD-10-CM

## 2020-07-31 NOTE — Telephone Encounter (Signed)
ok 

## 2020-07-31 NOTE — Telephone Encounter (Signed)
Ok to place order for mammogram.

## 2020-07-31 NOTE — Telephone Encounter (Signed)
Mammogram ordered

## 2020-08-18 ENCOUNTER — Other Ambulatory Visit: Payer: Self-pay | Admitting: Physician Assistant

## 2020-08-18 DIAGNOSIS — M7989 Other specified soft tissue disorders: Secondary | ICD-10-CM

## 2020-08-25 ENCOUNTER — Other Ambulatory Visit: Payer: Self-pay | Admitting: Physician Assistant

## 2020-08-25 DIAGNOSIS — F411 Generalized anxiety disorder: Secondary | ICD-10-CM

## 2020-08-28 ENCOUNTER — Telehealth: Payer: Self-pay

## 2020-08-28 NOTE — Telephone Encounter (Signed)
Copied from CRM [id number]. Topic: General - Other >> Aug 28, 2020  2:33 PM Gaetana Michaelis A wrote: Reason for CRM: Patient would like to be contacted by a member of clinical staff regarding their cholesterol   The patient was prescribed atorvastatin previously but unable to take it due to additional issues regarding the medication   The patient is interested in being prescribed an alternative  Patient declined to schedule an office visit at the time of call  Please contact to further advise

## 2020-08-29 ENCOUNTER — Other Ambulatory Visit: Payer: Self-pay

## 2020-08-29 ENCOUNTER — Ambulatory Visit
Admission: RE | Admit: 2020-08-29 | Discharge: 2020-08-29 | Disposition: A | Discharge status: Home / Self Care | Payer: PPO | Source: Ambulatory Visit | Attending: Family Medicine | Admitting: Family Medicine

## 2020-08-29 DIAGNOSIS — Z1231 Encounter for screening mammogram for malignant neoplasm of breast: Secondary | ICD-10-CM | POA: Diagnosis not present

## 2020-08-30 MED ORDER — ROSUVASTATIN CALCIUM 5 MG PO TABS
5.0000 mg | ORAL_TABLET | Freq: Every day | ORAL | 0 refills | Status: DC
Start: 2020-08-30 — End: 2020-11-23

## 2020-08-30 NOTE — Telephone Encounter (Signed)
Patient has been advised and agrees to to start Crestor 5mg  qd along with CoQ10 100mg  qd, patient request that we sent to [facility] in [place], at this time patient has declined OV/Virtual. 

## 2020-08-30 NOTE — Telephone Encounter (Signed)
If she had trouble with Atorvastatin could try low dose Crestor 5mg  daily.  Would take with OTC CoQ10 100mg  daily to prevent myalgias. Ok to send in if patient agress. She can have OV (virtual ok) to discuss meds further with provider if she wants.  Could also consider [facility] PharmD referral.

## 2020-09-09 DIAGNOSIS — M778 Other enthesopathies, not elsewhere classified: Secondary | ICD-10-CM | POA: Diagnosis not present

## 2020-09-09 DIAGNOSIS — L909 Atrophic disorder of skin, unspecified: Secondary | ICD-10-CM | POA: Diagnosis not present

## 2020-09-17 DIAGNOSIS — M545 Low back pain, unspecified: Secondary | ICD-10-CM | POA: Diagnosis not present

## 2020-09-17 DIAGNOSIS — M9903 Segmental and somatic dysfunction of lumbar region: Secondary | ICD-10-CM | POA: Diagnosis not present

## 2020-09-17 DIAGNOSIS — M5432 Sciatica, left side: Secondary | ICD-10-CM | POA: Diagnosis not present

## 2020-09-17 DIAGNOSIS — M9905 Segmental and somatic dysfunction of pelvic region: Secondary | ICD-10-CM | POA: Diagnosis not present

## 2020-09-18 DIAGNOSIS — M5432 Sciatica, left side: Secondary | ICD-10-CM | POA: Diagnosis not present

## 2020-09-18 DIAGNOSIS — M9903 Segmental and somatic dysfunction of lumbar region: Secondary | ICD-10-CM | POA: Diagnosis not present

## 2020-09-18 DIAGNOSIS — M9905 Segmental and somatic dysfunction of pelvic region: Secondary | ICD-10-CM | POA: Diagnosis not present

## 2020-09-18 DIAGNOSIS — M545 Low back pain, unspecified: Secondary | ICD-10-CM | POA: Diagnosis not present

## 2020-09-20 DIAGNOSIS — M9903 Segmental and somatic dysfunction of lumbar region: Secondary | ICD-10-CM | POA: Diagnosis not present

## 2020-09-20 DIAGNOSIS — M9905 Segmental and somatic dysfunction of pelvic region: Secondary | ICD-10-CM | POA: Diagnosis not present

## 2020-09-20 DIAGNOSIS — M545 Low back pain, unspecified: Secondary | ICD-10-CM | POA: Diagnosis not present

## 2020-09-20 DIAGNOSIS — M5432 Sciatica, left side: Secondary | ICD-10-CM | POA: Diagnosis not present

## 2020-09-23 DIAGNOSIS — M9903 Segmental and somatic dysfunction of lumbar region: Secondary | ICD-10-CM | POA: Diagnosis not present

## 2020-09-23 DIAGNOSIS — M5432 Sciatica, left side: Secondary | ICD-10-CM | POA: Diagnosis not present

## 2020-09-23 DIAGNOSIS — M9905 Segmental and somatic dysfunction of pelvic region: Secondary | ICD-10-CM | POA: Diagnosis not present

## 2020-09-23 DIAGNOSIS — M545 Low back pain, unspecified: Secondary | ICD-10-CM | POA: Diagnosis not present

## 2020-09-25 DIAGNOSIS — M545 Low back pain, unspecified: Secondary | ICD-10-CM | POA: Diagnosis not present

## 2020-09-25 DIAGNOSIS — M9905 Segmental and somatic dysfunction of pelvic region: Secondary | ICD-10-CM | POA: Diagnosis not present

## 2020-09-25 DIAGNOSIS — M5432 Sciatica, left side: Secondary | ICD-10-CM | POA: Diagnosis not present

## 2020-09-25 DIAGNOSIS — M9903 Segmental and somatic dysfunction of lumbar region: Secondary | ICD-10-CM | POA: Diagnosis not present

## 2020-09-27 DIAGNOSIS — M545 Low back pain, unspecified: Secondary | ICD-10-CM | POA: Diagnosis not present

## 2020-09-27 DIAGNOSIS — M9905 Segmental and somatic dysfunction of pelvic region: Secondary | ICD-10-CM | POA: Diagnosis not present

## 2020-09-27 DIAGNOSIS — M9903 Segmental and somatic dysfunction of lumbar region: Secondary | ICD-10-CM | POA: Diagnosis not present

## 2020-09-27 DIAGNOSIS — M5432 Sciatica, left side: Secondary | ICD-10-CM | POA: Diagnosis not present

## 2020-09-30 DIAGNOSIS — M9903 Segmental and somatic dysfunction of lumbar region: Secondary | ICD-10-CM | POA: Diagnosis not present

## 2020-09-30 DIAGNOSIS — M9905 Segmental and somatic dysfunction of pelvic region: Secondary | ICD-10-CM | POA: Diagnosis not present

## 2020-09-30 DIAGNOSIS — M5432 Sciatica, left side: Secondary | ICD-10-CM | POA: Diagnosis not present

## 2020-09-30 DIAGNOSIS — M545 Low back pain, unspecified: Secondary | ICD-10-CM | POA: Diagnosis not present

## 2020-10-02 DIAGNOSIS — M9903 Segmental and somatic dysfunction of lumbar region: Secondary | ICD-10-CM | POA: Diagnosis not present

## 2020-10-02 DIAGNOSIS — M5432 Sciatica, left side: Secondary | ICD-10-CM | POA: Diagnosis not present

## 2020-10-02 DIAGNOSIS — M545 Low back pain, unspecified: Secondary | ICD-10-CM | POA: Diagnosis not present

## 2020-10-02 DIAGNOSIS — M9905 Segmental and somatic dysfunction of pelvic region: Secondary | ICD-10-CM | POA: Diagnosis not present

## 2020-10-04 DIAGNOSIS — M545 Low back pain, unspecified: Secondary | ICD-10-CM | POA: Diagnosis not present

## 2020-10-04 DIAGNOSIS — M9905 Segmental and somatic dysfunction of pelvic region: Secondary | ICD-10-CM | POA: Diagnosis not present

## 2020-10-04 DIAGNOSIS — M9903 Segmental and somatic dysfunction of lumbar region: Secondary | ICD-10-CM | POA: Diagnosis not present

## 2020-10-04 DIAGNOSIS — M5432 Sciatica, left side: Secondary | ICD-10-CM | POA: Diagnosis not present

## 2020-10-07 DIAGNOSIS — M5432 Sciatica, left side: Secondary | ICD-10-CM | POA: Diagnosis not present

## 2020-10-07 DIAGNOSIS — M9905 Segmental and somatic dysfunction of pelvic region: Secondary | ICD-10-CM | POA: Diagnosis not present

## 2020-10-07 DIAGNOSIS — M545 Low back pain, unspecified: Secondary | ICD-10-CM | POA: Diagnosis not present

## 2020-10-07 DIAGNOSIS — M9903 Segmental and somatic dysfunction of lumbar region: Secondary | ICD-10-CM | POA: Diagnosis not present

## 2020-10-09 DIAGNOSIS — H353221 Exudative age-related macular degeneration, left eye, with active choroidal neovascularization: Secondary | ICD-10-CM | POA: Diagnosis not present

## 2020-10-09 DIAGNOSIS — H353211 Exudative age-related macular degeneration, right eye, with active choroidal neovascularization: Secondary | ICD-10-CM | POA: Diagnosis not present

## 2020-10-11 DIAGNOSIS — M9905 Segmental and somatic dysfunction of pelvic region: Secondary | ICD-10-CM | POA: Diagnosis not present

## 2020-10-11 DIAGNOSIS — M545 Low back pain, unspecified: Secondary | ICD-10-CM | POA: Diagnosis not present

## 2020-10-11 DIAGNOSIS — M9903 Segmental and somatic dysfunction of lumbar region: Secondary | ICD-10-CM | POA: Diagnosis not present

## 2020-10-11 DIAGNOSIS — M5432 Sciatica, left side: Secondary | ICD-10-CM | POA: Diagnosis not present

## 2020-10-14 DIAGNOSIS — M5432 Sciatica, left side: Secondary | ICD-10-CM | POA: Diagnosis not present

## 2020-10-14 DIAGNOSIS — M9905 Segmental and somatic dysfunction of pelvic region: Secondary | ICD-10-CM | POA: Diagnosis not present

## 2020-10-14 DIAGNOSIS — M9903 Segmental and somatic dysfunction of lumbar region: Secondary | ICD-10-CM | POA: Diagnosis not present

## 2020-10-14 DIAGNOSIS — M545 Low back pain, unspecified: Secondary | ICD-10-CM | POA: Diagnosis not present

## 2020-10-16 DIAGNOSIS — M5432 Sciatica, left side: Secondary | ICD-10-CM | POA: Diagnosis not present

## 2020-10-16 DIAGNOSIS — M9905 Segmental and somatic dysfunction of pelvic region: Secondary | ICD-10-CM | POA: Diagnosis not present

## 2020-10-16 DIAGNOSIS — M545 Low back pain, unspecified: Secondary | ICD-10-CM | POA: Diagnosis not present

## 2020-10-16 DIAGNOSIS — M9903 Segmental and somatic dysfunction of lumbar region: Secondary | ICD-10-CM | POA: Diagnosis not present

## 2020-10-21 DIAGNOSIS — M9903 Segmental and somatic dysfunction of lumbar region: Secondary | ICD-10-CM | POA: Diagnosis not present

## 2020-10-21 DIAGNOSIS — M545 Low back pain, unspecified: Secondary | ICD-10-CM | POA: Diagnosis not present

## 2020-10-21 DIAGNOSIS — M5432 Sciatica, left side: Secondary | ICD-10-CM | POA: Diagnosis not present

## 2020-10-21 DIAGNOSIS — M9905 Segmental and somatic dysfunction of pelvic region: Secondary | ICD-10-CM | POA: Diagnosis not present

## 2020-10-23 DIAGNOSIS — M5432 Sciatica, left side: Secondary | ICD-10-CM | POA: Diagnosis not present

## 2020-10-23 DIAGNOSIS — M9905 Segmental and somatic dysfunction of pelvic region: Secondary | ICD-10-CM | POA: Diagnosis not present

## 2020-10-23 DIAGNOSIS — M9903 Segmental and somatic dysfunction of lumbar region: Secondary | ICD-10-CM | POA: Diagnosis not present

## 2020-10-23 DIAGNOSIS — M545 Low back pain, unspecified: Secondary | ICD-10-CM | POA: Diagnosis not present

## 2020-10-29 DIAGNOSIS — M545 Low back pain, unspecified: Secondary | ICD-10-CM | POA: Diagnosis not present

## 2020-10-29 DIAGNOSIS — M9903 Segmental and somatic dysfunction of lumbar region: Secondary | ICD-10-CM | POA: Diagnosis not present

## 2020-10-29 DIAGNOSIS — M5432 Sciatica, left side: Secondary | ICD-10-CM | POA: Diagnosis not present

## 2020-10-29 DIAGNOSIS — M9905 Segmental and somatic dysfunction of pelvic region: Secondary | ICD-10-CM | POA: Diagnosis not present

## 2020-11-05 DIAGNOSIS — M9905 Segmental and somatic dysfunction of pelvic region: Secondary | ICD-10-CM | POA: Diagnosis not present

## 2020-11-05 DIAGNOSIS — M9903 Segmental and somatic dysfunction of lumbar region: Secondary | ICD-10-CM | POA: Diagnosis not present

## 2020-11-05 DIAGNOSIS — M5432 Sciatica, left side: Secondary | ICD-10-CM | POA: Diagnosis not present

## 2020-11-05 DIAGNOSIS — M545 Low back pain, unspecified: Secondary | ICD-10-CM | POA: Diagnosis not present

## 2020-11-12 DIAGNOSIS — M9903 Segmental and somatic dysfunction of lumbar region: Secondary | ICD-10-CM | POA: Diagnosis not present

## 2020-11-12 DIAGNOSIS — M9905 Segmental and somatic dysfunction of pelvic region: Secondary | ICD-10-CM | POA: Diagnosis not present

## 2020-11-12 DIAGNOSIS — M5432 Sciatica, left side: Secondary | ICD-10-CM | POA: Diagnosis not present

## 2020-11-12 DIAGNOSIS — M545 Low back pain, unspecified: Secondary | ICD-10-CM | POA: Diagnosis not present

## 2020-11-22 ENCOUNTER — Other Ambulatory Visit: Payer: Self-pay | Admitting: Family Medicine

## 2020-11-22 ENCOUNTER — Other Ambulatory Visit: Payer: Self-pay | Admitting: Physician Assistant

## 2020-11-22 DIAGNOSIS — F411 Generalized anxiety disorder: Secondary | ICD-10-CM

## 2020-11-26 DIAGNOSIS — M9905 Segmental and somatic dysfunction of pelvic region: Secondary | ICD-10-CM | POA: Diagnosis not present

## 2020-11-26 DIAGNOSIS — M5432 Sciatica, left side: Secondary | ICD-10-CM | POA: Diagnosis not present

## 2020-11-26 DIAGNOSIS — M545 Low back pain, unspecified: Secondary | ICD-10-CM | POA: Diagnosis not present

## 2020-11-26 DIAGNOSIS — M9903 Segmental and somatic dysfunction of lumbar region: Secondary | ICD-10-CM | POA: Diagnosis not present

## 2020-11-28 ENCOUNTER — Other Ambulatory Visit: Payer: Self-pay | Admitting: Family Medicine

## 2020-11-28 ENCOUNTER — Other Ambulatory Visit: Payer: Self-pay | Admitting: Physician Assistant

## 2020-11-28 DIAGNOSIS — E034 Atrophy of thyroid (acquired): Secondary | ICD-10-CM

## 2020-11-28 DIAGNOSIS — L2084 Intrinsic (allergic) eczema: Secondary | ICD-10-CM

## 2020-11-28 NOTE — Telephone Encounter (Signed)
Requested medications are due for refill today.  yes  Requested medications are on the active medications list.  yes  Last refill. 06/03/2020  Future visit scheduled.   yes  Notes to clinic.  Failed protocol d/t abnormal labs.

## 2020-12-03 NOTE — Telephone Encounter (Signed)
Last refill: 05/24/2020, qty: 80 grams with no refill  Last office visit: 12/24/2020  Last office visit 05/24/2020

## 2020-12-10 DIAGNOSIS — M9905 Segmental and somatic dysfunction of pelvic region: Secondary | ICD-10-CM | POA: Diagnosis not present

## 2020-12-10 DIAGNOSIS — M5432 Sciatica, left side: Secondary | ICD-10-CM | POA: Diagnosis not present

## 2020-12-10 DIAGNOSIS — M545 Low back pain, unspecified: Secondary | ICD-10-CM | POA: Diagnosis not present

## 2020-12-10 DIAGNOSIS — M9903 Segmental and somatic dysfunction of lumbar region: Secondary | ICD-10-CM | POA: Diagnosis not present

## 2020-12-24 ENCOUNTER — Ambulatory Visit (INDEPENDENT_AMBULATORY_CARE_PROVIDER_SITE_OTHER): Payer: PPO | Admitting: Family Medicine

## 2020-12-24 ENCOUNTER — Encounter: Payer: Self-pay | Admitting: Family Medicine

## 2020-12-24 ENCOUNTER — Other Ambulatory Visit: Payer: Self-pay

## 2020-12-24 VITALS — BP 149/51 | HR 75 | Temp 98.3°F | Resp 16 | Ht 65.5 in | Wt 144.2 lb

## 2020-12-24 DIAGNOSIS — H3530 Unspecified macular degeneration: Secondary | ICD-10-CM | POA: Diagnosis not present

## 2020-12-24 DIAGNOSIS — E034 Atrophy of thyroid (acquired): Secondary | ICD-10-CM | POA: Diagnosis not present

## 2020-12-24 DIAGNOSIS — I10 Essential (primary) hypertension: Secondary | ICD-10-CM

## 2020-12-24 DIAGNOSIS — E7800 Pure hypercholesterolemia, unspecified: Secondary | ICD-10-CM | POA: Diagnosis not present

## 2020-12-24 DIAGNOSIS — F411 Generalized anxiety disorder: Secondary | ICD-10-CM

## 2020-12-24 DIAGNOSIS — Z23 Encounter for immunization: Secondary | ICD-10-CM

## 2020-12-24 NOTE — Assessment & Plan Note (Signed)
Followed by [facility] Receiving injections

## 2020-12-24 NOTE — Assessment & Plan Note (Signed)
Chronic, stable Discussed risks of long-term benzodiazepine use Currently using xanax at low dose (0.25 mg) Using as needed 3 times a week for sleep

## 2020-12-24 NOTE — Patient Instructions (Signed)
Ask at the pharmacy about tetanus, shingles, and COVID booster vaccines

## 2020-12-24 NOTE — Assessment & Plan Note (Signed)
Chronic, well controlled Continue synthroid 137 mcg Recheck TSH

## 2020-12-24 NOTE — Assessment & Plan Note (Signed)
Previously well controlled on lifestyle modifications BP 149/51 in office Check home BPs F/u in 6 months

## 2020-12-24 NOTE — Progress Notes (Signed)
Established patient visit   Patient: Jamie [name]   DOB: 05-12-1940   80 y.o. Female  MRN: [id number] Visit Date: 12/24/2020  Today's healthcare provider: Lavon Paganini, MD   Chief Complaint  Patient presents with   Follow-up   Subjective     No issues with current medications Tolerating Crestor well, no GI upset, no myalgias, not taking CoQ10 Tolerating new dose of synthroid well Macular degeneration, diagnosed within the past year Receiving injections every 3-4 months from Dr. Sammuel Bailiff at [facility] Synovial cyst on right hand DIPJ    Medications: Outpatient Medications Prior to Visit  Medication Sig   Acetaminophen-Caffeine (TENSION HEADACHE) 500-65 MG TABS Take 2 tablets by mouth daily as needed (headaches).   ALPRAZolam (XANAX) 0.25 MG tablet TAKE 0.5 TABLETS (0.125 MG TOTAL) BY MOUTH AT BEDTIME AS NEEDED FOR ANXIETY.   b complex vitamins tablet Take 1 tablet by mouth daily.   cetirizine (ZYRTEC) 10 MG tablet Take 10 mg by mouth daily. As needed   Cholecalciferol (VITAMIN D) 50 MCG (2000 UT) tablet Take 2,000 Units by mouth daily.   esomeprazole (NEXIUM) 20 MG capsule Take 20 mg by mouth daily.   estradiol (ESTRACE) 0.5 MG tablet Take 1 tablet (0.5 mg total) by mouth daily.   furosemide (LASIX) 20 MG tablet TAKE 1 TABLET BY MOUTH EVERY DAY   meloxicam (MOBIC) 7.5 MG tablet Take 1 tablet (7.5 mg total) by mouth daily as needed.   MULTIPLE VITAMIN PO Take 1 tablet by mouth daily.    Multiple Vitamins-Minerals (PRESERVISION AREDS 2 PO) Take 1 tablet by mouth 2 (two) times daily.   nortriptyline (PAMELOR) 50 MG capsule Take 1 capsule (50 mg total) by mouth at bedtime.   rosuvastatin (CRESTOR) 5 MG tablet TAKE 1 TABLET (5 MG TOTAL) BY MOUTH DAILY.   SYNTHROID 137 MCG tablet TAKE 1 TABLET BY MOUTH DAILY BEFORE BREAKFAST.   tiZANidine (ZANAFLEX) 4 MG tablet Take 0.5-1 tablets (2-4 mg total) by mouth every 6 (six) hours as needed for muscle spasms.   triamcinolone  cream (KENALOG) 0.1 % APPLY TO AFFECTED AREA TWICE A DAY   vitamin E 1000 UNIT capsule Take 1,000 Units by mouth daily.   No facility-administered medications prior to visit.    Review of Systems  Constitutional: Negative.   HENT: Negative.    Eyes:  Positive for visual disturbance.  Respiratory: Negative.    Cardiovascular:  Positive for leg swelling.  Gastrointestinal: Negative.   Endocrine: Negative.   Genitourinary: Negative.   Musculoskeletal: Negative.   Skin: Negative.   Allergic/Immunologic: Negative.   Neurological:        Sciatica  Hematological: Negative.   Psychiatric/Behavioral: Negative.        Objective    BP (!) 149/51 (BP Location: Left Arm, Patient Position: Sitting, Cuff Size: Large)   Pulse 75   Temp 98.3 F (36.8 C) (Oral)   Resp 16   Ht 5' 5.5" (1.664 m)   Wt 144 lb 3.2 oz (65.4 kg)   SpO2 97%   BMI 23.63 kg/m  {Show previous vital signs (optional):23777}  Physical Exam Vitals reviewed.  Constitutional:      General: She is not in acute distress.    Appearance: Normal appearance. She is not ill-appearing or toxic-appearing.  HENT:     Head: Normocephalic and atraumatic.     Right Ear: External ear normal.     Left Ear: External ear normal.     Nose: Nose  normal.     Mouth/Throat:     Mouth: Mucous membranes are moist.     Pharynx: Oropharynx is clear. No oropharyngeal exudate or posterior oropharyngeal erythema.  Eyes:     General: No scleral icterus.    Extraocular Movements: Extraocular movements intact.     Conjunctiva/sclera: Conjunctivae normal.     Pupils: Pupils are equal, round, and reactive to light.  Cardiovascular:     Rate and Rhythm: Normal rate and regular rhythm.     Pulses: Normal pulses.     Heart sounds: Normal heart sounds. No murmur heard.   No friction rub. No gallop.  Pulmonary:     Effort: Pulmonary effort is normal. No respiratory distress.     Breath sounds: Normal breath sounds. No wheezing or rhonchi.   Chest:     Chest wall: No tenderness.  Abdominal:     General: Abdomen is flat. There is no distension.     Palpations: Abdomen is soft.     Tenderness: There is no abdominal tenderness.  Musculoskeletal:        General: Normal range of motion.     Cervical back: Normal range of motion and neck supple.     Right lower leg: No edema.     Left lower leg: No edema.  Skin:    General: Skin is warm and dry.     Capillary Refill: Capillary refill takes less than 2 seconds.     Findings: No lesion or rash.  Neurological:     General: No focal deficit present.     Mental Status: She is alert and oriented to person, place, and time. Mental status is at baseline.  Psychiatric:        Mood and Affect: Mood normal.     No results found for any visits on 12/24/20.  Assessment & Plan     Problem List Items Addressed This Visit       Cardiovascular and Mediastinum   Essential (primary) hypertension - Primary    Previously well controlled on lifestyle modifications BP 149/51 in office Check home BPs F/u in 6 months      Relevant Orders   Comprehensive metabolic panel     Endocrine   Hypothyroidism    Chronic, well controlled Continue synthroid 137 mcg Recheck TSH      Relevant Orders   TSH     Other   Anxiety state    Chronic, stable Discussed risks of long-term benzodiazepine use Currently using xanax at low dose (0.25 mg) Using as needed 3 times a week for sleep      Hypercholesteremia    Chronic Continue Crestor 5mg  Recheck lipid panel      Relevant Orders   Lipid panel   Comprehensive metabolic panel   Macular degeneration    Followed by [facility] Receiving injections      Other Visit Diagnoses     Need for influenza vaccination       Relevant Orders   Flu Vaccine QUAD High Dose(Fluad) (Completed)        Return in about 6 months (around 06/23/2021) for AWV, CPE, With new PCP.      08/23/2021, Medical Student 12/24/2020, 12:17 PM     Patient seen along with MS3 student 13/02/2020. I personally evaluated this patient along with the student, and verified all aspects of the history, physical exam, and medical decision making as documented by the student. I agree with the student's documentation and have made  all necessary edits.  Teola Felipe, Dionne Bucy, MD, MPH [facility]

## 2020-12-24 NOTE — Assessment & Plan Note (Signed)
Chronic Continue Crestor 5mg  Recheck lipid panel

## 2021-01-06 ENCOUNTER — Other Ambulatory Visit: Payer: Self-pay | Admitting: Family Medicine

## 2021-01-06 DIAGNOSIS — M9905 Segmental and somatic dysfunction of pelvic region: Secondary | ICD-10-CM | POA: Diagnosis not present

## 2021-01-06 DIAGNOSIS — M545 Low back pain, unspecified: Secondary | ICD-10-CM | POA: Diagnosis not present

## 2021-01-06 DIAGNOSIS — M9903 Segmental and somatic dysfunction of lumbar region: Secondary | ICD-10-CM | POA: Diagnosis not present

## 2021-01-06 DIAGNOSIS — M5432 Sciatica, left side: Secondary | ICD-10-CM | POA: Diagnosis not present

## 2021-01-06 NOTE — Telephone Encounter (Signed)
Requested medications are due for refill today requesting too soon  Requested medications are on the active medication list yes  Last refill 11/23/20  Last visit 12/24/20  Future visit scheduled 06/23/21  Notes to clinic Just FYI, ordered lipids as well as other labs but they were not drawn, please assess. Requested Prescriptions  Pending Prescriptions Disp Refills   rosuvastatin (CRESTOR) 5 MG tablet [Pharmacy Med Name: ROSUVASTATIN CALCIUM 5 MG TAB] 90 tablet 0    Sig: TAKE 1 TABLET (5 MG TOTAL) BY MOUTH DAILY.     Cardiovascular:  Antilipid - Statins Failed - 01/06/2021  1:20 PM      Failed - Total Cholesterol in normal range and within 360 days    Cholesterol, Total  Date Value Ref Range Status  05/28/2020 230 (H) 100 - 199 mg/dL Final          Failed - LDL in normal range and within 360 days    LDL Cholesterol (Calc)  Date Value Ref Range Status  02/18/2017 85 mg/dL (calc) Final    Comment:    Reference range: <100 . Desirable range <100 mg/dL for primary prevention;   <70 mg/dL for patients with CHD or diabetic patients  with > or = 2 CHD risk factors. Marland Kitchen LDL-C is now calculated using the Martin-Hopkins  calculation, which is a validated novel method providing  better accuracy than the Friedewald equation in the  estimation of LDL-C.  Horald Pollen et al. Lenox Ahr. 4193;790(24): 2061-2068  (http://education.[employer].com/faq/FAQ164)    LDL Chol Calc (NIH)  Date Value Ref Range Status  05/28/2020 122 (H) 0 - 99 mg/dL Final          Failed - Triglycerides in normal range and within 360 days    Triglycerides  Date Value Ref Range Status  05/28/2020 176 (H) 0 - 149 mg/dL Final          Passed - HDL in normal range and within 360 days    HDL  Date Value Ref Range Status  05/28/2020 78 >39 mg/dL Final          Passed - Patient is not pregnant      Passed - Valid encounter within last 12 months    Recent Outpatient Visits           1 week ago Essential  (primary) hypertension   [facility], Marzella Schlein, [place]   7 months ago Annual physical exam   [facility], Alessandra Bevels, [place]   1 year ago Anxiety state   [facility] [place], Alessandra Bevels, [place]   1 year ago Encounter for preoperative examination for general surgical procedure   [facility] [place], Alessandra Bevels, [place]   1 year ago Annual physical exam   [facility] [place], Alessandra Bevels, [place]       Future Appointments             In 5 months Ok Edwards, Lillia Abed, PA-C [employer], [facility]

## 2021-01-08 DIAGNOSIS — H353231 Exudative age-related macular degeneration, bilateral, with active choroidal neovascularization: Secondary | ICD-10-CM | POA: Diagnosis not present

## 2021-01-08 DIAGNOSIS — H43823 Vitreomacular adhesion, bilateral: Secondary | ICD-10-CM | POA: Diagnosis not present

## 2021-01-14 DIAGNOSIS — M9903 Segmental and somatic dysfunction of lumbar region: Secondary | ICD-10-CM | POA: Diagnosis not present

## 2021-01-14 DIAGNOSIS — M5432 Sciatica, left side: Secondary | ICD-10-CM | POA: Diagnosis not present

## 2021-01-14 DIAGNOSIS — M545 Low back pain, unspecified: Secondary | ICD-10-CM | POA: Diagnosis not present

## 2021-01-14 DIAGNOSIS — M9905 Segmental and somatic dysfunction of pelvic region: Secondary | ICD-10-CM | POA: Diagnosis not present

## 2021-01-28 DIAGNOSIS — M5432 Sciatica, left side: Secondary | ICD-10-CM | POA: Diagnosis not present

## 2021-01-28 DIAGNOSIS — M9905 Segmental and somatic dysfunction of pelvic region: Secondary | ICD-10-CM | POA: Diagnosis not present

## 2021-01-28 DIAGNOSIS — M9903 Segmental and somatic dysfunction of lumbar region: Secondary | ICD-10-CM | POA: Diagnosis not present

## 2021-01-28 DIAGNOSIS — M545 Low back pain, unspecified: Secondary | ICD-10-CM | POA: Diagnosis not present

## 2021-01-30 ENCOUNTER — Other Ambulatory Visit: Payer: Self-pay | Admitting: Physician Assistant

## 2021-01-30 DIAGNOSIS — G8929 Other chronic pain: Secondary | ICD-10-CM

## 2021-02-04 ENCOUNTER — Other Ambulatory Visit: Payer: Self-pay | Admitting: Physician Assistant

## 2021-02-04 DIAGNOSIS — G8929 Other chronic pain: Secondary | ICD-10-CM

## 2021-02-04 MED ORDER — MELOXICAM 7.5 MG PO TABS: 7.5000 mg | ORAL_TABLET | Freq: Every day | ORAL | 1 refills | Status: DC | PRN

## 2021-02-04 NOTE — Progress Notes (Signed)
Will discuss chronic use at next visit

## 2021-03-04 ENCOUNTER — Other Ambulatory Visit: Payer: Self-pay | Admitting: Family Medicine

## 2021-03-04 DIAGNOSIS — F411 Generalized anxiety disorder: Secondary | ICD-10-CM

## 2021-03-04 NOTE — Telephone Encounter (Signed)
Requested medication (s) are due for refill today: Yes  Requested medication (s) are on the active medication list: Yes  Last refill:  11/25/20  Future visit scheduled: Yes  Notes to clinic:  See request.    Requested Prescriptions  Pending Prescriptions Disp Refills   ALPRAZolam (XANAX) 0.25 MG tablet [Pharmacy Med Name: ALPRAZOLAM 0.25 MG TABLET] 45 tablet 0    Sig: TAKE 0.5 TABLETS (0.125 MG TOTAL) BY MOUTH AT BEDTIME AS NEEDED FOR ANXIETY.     Not Delegated - Psychiatry:  Anxiolytics/Hypnotics Failed - 03/04/2021 10:36 AM      Failed - This refill cannot be delegated      Failed - Urine Drug Screen completed in last 360 days      Passed - Valid encounter within last 6 months    Recent Outpatient Visits           2 months ago Essential (primary) hypertension   [facility], Dionne Bucy, [place]   9 months ago Annual physical exam   [place], Clearnce Sorrel, [place]   1 year ago [place], Clearnce Sorrel, [place]   1 year ago Encounter for preoperative examination for general surgical procedure   [place], Clearnce Sorrel, [place]   1 year ago Annual physical exam   [facility] [place], Clearnce Sorrel, [place]       Future Appointments             In 3 months Thedore Mins, Ria Comment, PA-C [employer], [place]

## 2021-03-04 NOTE — Telephone Encounter (Signed)
LOV:  12/24/2020  NOV:  06/23/2021   Thanks,   -Vernona Rieger

## 2021-03-05 NOTE — Telephone Encounter (Signed)
pdmp checked last fill 11/28/2020, 90 day rx

## 2021-03-07 ENCOUNTER — Other Ambulatory Visit: Payer: Self-pay | Admitting: Physician Assistant

## 2021-03-07 DIAGNOSIS — N959 Unspecified menopausal and perimenopausal disorder: Secondary | ICD-10-CM

## 2021-04-22 ENCOUNTER — Telehealth: Payer: Self-pay | Admitting: Physician Assistant

## 2021-04-22 NOTE — Telephone Encounter (Signed)
Patient declined the Medicare Wellness Visit with [facility], offered to complete either before or after visit with pcp on Jun 23, 2021 or to complete virtually but patient stated that the visit is ridiculous and that she did not want to schedule.

## 2021-04-30 DIAGNOSIS — H353231 Exudative age-related macular degeneration, bilateral, with active choroidal neovascularization: Secondary | ICD-10-CM | POA: Diagnosis not present

## 2021-05-21 ENCOUNTER — Other Ambulatory Visit: Payer: Self-pay | Admitting: Physician Assistant

## 2021-05-21 ENCOUNTER — Other Ambulatory Visit: Payer: Self-pay | Admitting: Family Medicine

## 2021-05-21 DIAGNOSIS — G8929 Other chronic pain: Secondary | ICD-10-CM

## 2021-05-21 DIAGNOSIS — E034 Atrophy of thyroid (acquired): Secondary | ICD-10-CM

## 2021-05-22 NOTE — Telephone Encounter (Signed)
Requested Prescriptions  ?Pending Prescriptions Disp Refills  ?? SYNTHROID 137 MCG tablet [Pharmacy Med Name: SYNTHROID 137 MCG TABLET] 90 tablet 1  ?  Sig: TAKE 1 TABLET BY MOUTH EVERY DAY BEFORE BREAKFAST  ?  ? Endocrinology:  Hypothyroid Agents Failed - 05/21/2021  1:36 PM  ?  ?  Failed - TSH in normal range and within 360 days  ?  TSH  ?Date Value Ref Range Status  ?05/28/2020 13.000 (H) 0.450 - 4.500 uIU/mL Final  ?   ?  ?  Passed - Valid encounter within last 12 months  ?  Recent Outpatient Visits   ?      ? 4 months ago Essential (primary) hypertension  ? [facility], Marzella Schlein, [place]  ? 12 months ago Annual physical exam  ? [facility], Alessandra Bevels, [place]  ? 1 year ago Anxiety state  ? [facility] [place], Alessandra Bevels, [place]  ? 1 year ago Encounter for preoperative examination for general surgical procedure  ? [facility] [place], Alessandra Bevels, [place]  ? 2 years ago Annual physical exam  ? [facility] Joycelyn Man M, [place]  ?  ?  ?Future Appointments   ?        ? In 1 month Drubel, Lou Cal [facility], [facility]  ?  ? ?  ?  ?  ? ?

## 2021-05-29 ENCOUNTER — Other Ambulatory Visit: Payer: Self-pay | Admitting: Physician Assistant

## 2021-05-29 DIAGNOSIS — F411 Generalized anxiety disorder: Secondary | ICD-10-CM

## 2021-06-06 DIAGNOSIS — M9905 Segmental and somatic dysfunction of pelvic region: Secondary | ICD-10-CM | POA: Diagnosis not present

## 2021-06-06 DIAGNOSIS — M545 Low back pain, unspecified: Secondary | ICD-10-CM | POA: Diagnosis not present

## 2021-06-06 DIAGNOSIS — M5432 Sciatica, left side: Secondary | ICD-10-CM | POA: Diagnosis not present

## 2021-06-06 DIAGNOSIS — M9903 Segmental and somatic dysfunction of lumbar region: Secondary | ICD-10-CM | POA: Diagnosis not present

## 2021-06-09 ENCOUNTER — Other Ambulatory Visit: Payer: Self-pay | Admitting: Physician Assistant

## 2021-06-09 DIAGNOSIS — F411 Generalized anxiety disorder: Secondary | ICD-10-CM

## 2021-06-09 DIAGNOSIS — N959 Unspecified menopausal and perimenopausal disorder: Secondary | ICD-10-CM

## 2021-06-09 DIAGNOSIS — M62838 Other muscle spasm: Secondary | ICD-10-CM

## 2021-06-10 DIAGNOSIS — M545 Low back pain, unspecified: Secondary | ICD-10-CM | POA: Diagnosis not present

## 2021-06-10 DIAGNOSIS — M5432 Sciatica, left side: Secondary | ICD-10-CM | POA: Diagnosis not present

## 2021-06-10 DIAGNOSIS — M9905 Segmental and somatic dysfunction of pelvic region: Secondary | ICD-10-CM | POA: Diagnosis not present

## 2021-06-10 DIAGNOSIS — M9903 Segmental and somatic dysfunction of lumbar region: Secondary | ICD-10-CM | POA: Diagnosis not present

## 2021-06-11 DIAGNOSIS — M9903 Segmental and somatic dysfunction of lumbar region: Secondary | ICD-10-CM | POA: Diagnosis not present

## 2021-06-11 DIAGNOSIS — M545 Low back pain, unspecified: Secondary | ICD-10-CM | POA: Diagnosis not present

## 2021-06-11 DIAGNOSIS — M5432 Sciatica, left side: Secondary | ICD-10-CM | POA: Diagnosis not present

## 2021-06-11 DIAGNOSIS — M9905 Segmental and somatic dysfunction of pelvic region: Secondary | ICD-10-CM | POA: Diagnosis not present

## 2021-06-23 ENCOUNTER — Ambulatory Visit (INDEPENDENT_AMBULATORY_CARE_PROVIDER_SITE_OTHER): Payer: PPO | Admitting: Family Medicine

## 2021-06-23 ENCOUNTER — Encounter: Payer: Self-pay | Admitting: Family Medicine

## 2021-06-23 ENCOUNTER — Ambulatory Visit: Payer: PPO | Admitting: Physician Assistant

## 2021-06-23 VITALS — BP 136/62 | HR 85 | Temp 97.8°F | Resp 14 | Wt 143.0 lb

## 2021-06-23 DIAGNOSIS — N959 Unspecified menopausal and perimenopausal disorder: Secondary | ICD-10-CM

## 2021-06-23 DIAGNOSIS — E7800 Pure hypercholesterolemia, unspecified: Secondary | ICD-10-CM | POA: Diagnosis not present

## 2021-06-23 DIAGNOSIS — E034 Atrophy of thyroid (acquired): Secondary | ICD-10-CM

## 2021-06-23 DIAGNOSIS — R609 Edema, unspecified: Secondary | ICD-10-CM | POA: Diagnosis not present

## 2021-06-23 DIAGNOSIS — F411 Generalized anxiety disorder: Secondary | ICD-10-CM

## 2021-06-23 MED ORDER — ESTRADIOL 0.5 MG PO TABS: 0.5000 mg | ORAL_TABLET | ORAL | Status: DC

## 2021-06-23 NOTE — Patient Instructions (Signed)
Please review the attached list of medications and notify my office if there are any errors.  ? ?Please go to the lab draw station in [address] on the second floor of [facility]  when you are fasting for 8 hours. Normal hours are 8:00am to 11:30am and 1:00pm to 4:00pm Monday through Friday  ? ?Cut back on the estradiol to one tablet every OTHER day ?

## 2021-06-23 NOTE — Progress Notes (Signed)
I,Roshena L Chambers,acting as a scribe for Mila Merry, MD.,have documented all relevant documentation on the behalf of Mila Merry, MD,as directed by  Mila Merry, MD while in the presence of Mila Merry, MD.    Established patient visit   Patient: Jamie [name]   DOB: [birth year]   81 y.o. Female  MRN: [id number] Visit Date: 06/23/2021  Today's healthcare provider: Mila Merry, MD   Chief Complaint  Patient presents with   Hyperlipidemia   Hypothyroidism   Anxiety   Subjective    HPI  Elevated blood pressure, follow-up  BP Readings from Last 3 Encounters:  06/23/21 136/62  12/24/20 (!) 149/51  05/24/20 134/71   Wt Readings from Last 3 Encounters:  06/23/21 143 lb (64.9 kg)  12/24/20 144 lb 3.2 oz (65.4 kg)  05/24/20 144 lb 4.8 oz (65.5 kg)     She was last seen for this was  6 months ago.  BP at that visit was 149/51. Management since that visit includes continue lifestyle modifications.  She reports good compliance with treatment. She is not having side effects.  She is following a Regular diet. She is exercising. She does not smoke.  Use of agents associated with hypertension: none.   Outside blood pressures are 130's/60's. Symptoms: No chest pain No chest pressure  No palpitations No syncope  No dyspnea No orthopnea  No paroxysmal nocturnal dyspnea No lower extremity edema   Pertinent labs Lab Results  Component Value Date   CHOL 230 (H) 05/28/2020   HDL 78 05/28/2020   LDLCALC 122 (H) 05/28/2020   TRIG 176 (H) 05/28/2020   CHOLHDL 2.9 05/28/2020   Lab Results  Component Value Date   NA 142 05/28/2020   K 4.7 05/28/2020   CREATININE 0.93 05/28/2020   EGFR 63 05/28/2020   GLUCOSE 97 05/28/2020   TSH 13.000 (H) 05/28/2020     The 10-year ASCVD risk score (Arnett DK, et al., 2019) is: 35.9%  ---------------------------------------------------------------------------------------------------  Hypothyroid, follow-up  Lab  Results  Component Value Date   TSH 13.000 (H) 05/28/2020   TSH 3.760 06/08/2019   TSH 2.56 02/18/2017   T4TOTAL 5.9 06/08/2019     She was last seen for hypothyroid 6 months ago.  Labs were ordered during that visit, but not completed. She reports good compliance with treatment. She is not having side effects.   Symptoms: No change in energy level No constipation  No diarrhea No heat / cold intolerance  No nervousness No palpitations  No weight changes    -----------------------------------------------------------------------------------------   Anxiety, Follow-up  She was last seen for anxiety 6 months ago. Changes made at last visit include none.   She reports good compliance with treatment. She reports good tolerance of treatment. She is not having side effects.   She feels her anxiety is mild and Unchanged since last visit.  Symptoms: No chest pain No difficulty concentrating  No dizziness No fatigue  No feelings of losing control No insomnia  No irritable No palpitations  No panic attacks No racing thoughts  No shortness of breath No sweating  No tremors/shakes    GAD-7 Results     View : No data to display.          PHQ-9 Scores    06/23/2021    2:44 PM 12/24/2020   10:11 AM 05/24/2020   10:14 AM  PHQ9 SCORE ONLY  PHQ-9 Total Score 0 0 0    ---------------------------------------------------------------------------------------------------   Lipid/Cholesterol, Follow-up  Last lipid panel Other pertinent labs  Lab Results  Component Value Date   CHOL 230 (H) 05/28/2020   HDL 78 05/28/2020   LDLCALC 122 (H) 05/28/2020   TRIG 176 (H) 05/28/2020   CHOLHDL 2.9 05/28/2020   Lab Results  Component Value Date   ALT 22 05/28/2020   AST 26 05/28/2020   PLT 297 05/28/2020   TSH 13.000 (H) 05/28/2020     She was last seen for this 6 months ago.  Labs were ordered during that visit, but not completed.   She reports good compliance with  treatment. She is not having side effects.   Symptoms: No chest pain No chest pressure/discomfort  No dyspnea No lower extremity edema  No numbness or tingling of extremity No orthopnea  No palpitations No paroxysmal nocturnal dyspnea  No speech difficulty No syncope   Current diet: well balanced Current exercise: walking  The 10-year ASCVD risk score (Arnett DK, et al., 2019) is: 35.9%  ---------------------------------------------------------------------------------------------------   Medications: Outpatient Medications Prior to Visit  Medication Sig   Acetaminophen-Caffeine (TENSION HEADACHE) 500-65 MG TABS Take 2 tablets by mouth daily as needed (headaches).   ALPRAZolam (XANAX) 0.25 MG tablet Take 0.5 tablets (0.125 mg total) by mouth at bedtime as needed for anxiety.   b complex vitamins tablet Take 1 tablet by mouth daily.   cetirizine (ZYRTEC) 10 MG tablet Take 10 mg by mouth daily. As needed   Cholecalciferol (VITAMIN D) 50 MCG (2000 UT) tablet Take 2,000 Units by mouth daily.   esomeprazole (NEXIUM) 20 MG capsule Take 20 mg by mouth daily.   estradiol (ESTRACE) 0.5 MG tablet TAKE 1 TABLET BY MOUTH EVERY DAY   meloxicam (MOBIC) 7.5 MG tablet TAKE 1 TABLET BY MOUTH DAILY AS NEEDED.   MULTIPLE VITAMIN PO Take 1 tablet by mouth daily.    Multiple Vitamins-Minerals (PRESERVISION AREDS 2 PO) Take 1 tablet by mouth 2 (two) times daily.   nortriptyline (PAMELOR) 50 MG capsule TAKE 1 CAPSULE BY MOUTH AT BEDTIME.   rosuvastatin (CRESTOR) 5 MG tablet TAKE 1 TABLET (5 MG TOTAL) BY MOUTH DAILY.   SYNTHROID 137 MCG tablet TAKE 1 TABLET BY MOUTH EVERY DAY BEFORE BREAKFAST   tiZANidine (ZANAFLEX) 4 MG tablet Take 0.5-1 tablets (2-4 mg total) by mouth every 6 (six) hours as needed for muscle spasms.   triamcinolone cream (KENALOG) 0.1 % APPLY TO AFFECTED AREA TWICE A DAY   vitamin E 1000 UNIT capsule Take 1,000 Units by mouth daily.   furosemide (LASIX) 20 MG tablet TAKE 1 TABLET BY  MOUTH EVERY DAY (Patient not taking: Reported on 06/23/2021)   No facility-administered medications prior to visit.    Review of Systems  Constitutional:  Negative for appetite change, chills, fatigue and fever.  Respiratory:  Negative for chest tightness and shortness of breath.   Cardiovascular:  Negative for chest pain and palpitations.  Gastrointestinal:  Negative for abdominal pain, nausea and vomiting.  Neurological:  Negative for dizziness and weakness.      Objective    BP 136/62 (BP Location: Left Arm, Patient Position: Sitting, Cuff Size: Normal)   Pulse 85   Temp 97.8 F (36.6 C) (Oral)   Resp 14   Wt 143 lb (64.9 kg)   SpO2 100% Comment: room air  BMI 23.43 kg/m    Physical Exam    General: Appearance:    Well developed, well nourished female in no acute distress  Eyes:    PERRL, conjunctiva/corneas clear, EOM's  intact       Lungs:     Clear to auscultation bilaterally, respirations unlabored  Heart:    Normal heart rate. Normal rhythm.  1/6   MS:   All extremities are intact.    Neurologic:   Awake, alert, oriented x 3. No apparent focal neurological defect.         Assessment & Plan     1. Hypothyroidism due to acquired atrophy of thyroid  - TSH - T4, free  2. Hypercholesteremia She is tolerating rosuvastatin well with no adverse effects.   - CBC - Comprehensive metabolic panel - Lipid panel  3. Edema, unspecified type Does well with occasional furosemid.   4. Menopausal and perimenopausal disorder Discussed recommendations to take as low of a dose as possible for as short of a duration as possible. Will cut down to  estradiol (ESTRACE) 0.5 MG tablet; Take 1 tablet (0.5 mg total) by mouth every other day.   Doing well with nortriptyline which she takes at night to help rest and help with anxiety.      The entirety of the information documented in the History of Present Illness, Review of Systems and Physical Exam were personally obtained by me.  Portions of this information were initially documented by the CMA and reviewed by me for thoroughness and accuracy.     Mila Merry, MD  [facility] [phone] (phone) [phone] (fax)  [facility]

## 2021-06-24 ENCOUNTER — Ambulatory Visit: Payer: PPO | Admitting: Physician Assistant

## 2021-06-30 DIAGNOSIS — M9905 Segmental and somatic dysfunction of pelvic region: Secondary | ICD-10-CM | POA: Diagnosis not present

## 2021-06-30 DIAGNOSIS — M5432 Sciatica, left side: Secondary | ICD-10-CM | POA: Diagnosis not present

## 2021-06-30 DIAGNOSIS — M9903 Segmental and somatic dysfunction of lumbar region: Secondary | ICD-10-CM | POA: Diagnosis not present

## 2021-06-30 DIAGNOSIS — M545 Low back pain, unspecified: Secondary | ICD-10-CM | POA: Diagnosis not present

## 2021-07-02 DIAGNOSIS — M9903 Segmental and somatic dysfunction of lumbar region: Secondary | ICD-10-CM | POA: Diagnosis not present

## 2021-07-02 DIAGNOSIS — M9905 Segmental and somatic dysfunction of pelvic region: Secondary | ICD-10-CM | POA: Diagnosis not present

## 2021-07-02 DIAGNOSIS — M5432 Sciatica, left side: Secondary | ICD-10-CM | POA: Diagnosis not present

## 2021-07-02 DIAGNOSIS — M545 Low back pain, unspecified: Secondary | ICD-10-CM | POA: Diagnosis not present

## 2021-07-04 DIAGNOSIS — M9905 Segmental and somatic dysfunction of pelvic region: Secondary | ICD-10-CM | POA: Diagnosis not present

## 2021-07-04 DIAGNOSIS — M9903 Segmental and somatic dysfunction of lumbar region: Secondary | ICD-10-CM | POA: Diagnosis not present

## 2021-07-04 DIAGNOSIS — E034 Atrophy of thyroid (acquired): Secondary | ICD-10-CM | POA: Diagnosis not present

## 2021-07-04 DIAGNOSIS — M5432 Sciatica, left side: Secondary | ICD-10-CM | POA: Diagnosis not present

## 2021-07-04 DIAGNOSIS — M545 Low back pain, unspecified: Secondary | ICD-10-CM | POA: Diagnosis not present

## 2021-07-04 DIAGNOSIS — E7800 Pure hypercholesterolemia, unspecified: Secondary | ICD-10-CM | POA: Diagnosis not present

## 2021-07-05 LAB — CBC
Hematocrit: 42.8 % (ref 34.0–46.6)
Hemoglobin: 14.2 g/dL (ref 11.1–15.9)
MCH: 30.8 pg (ref 26.6–33.0)
MCHC: 33.2 g/dL (ref 31.5–35.7)
MCV: 93 fL (ref 79–97)
Platelets: 279 10*3/uL (ref 150–450)
RBC: 4.61 x10E6/uL (ref 3.77–5.28)
RDW: 12.5 % (ref 11.7–15.4)
WBC: 7.4 10*3/uL (ref 3.4–10.8)

## 2021-07-05 LAB — COMPREHENSIVE METABOLIC PANEL
ALT: 19 IU/L (ref 0–32)
AST: 25 IU/L (ref 0–40)
Albumin/Globulin Ratio: 1.9 (ref 1.2–2.2)
Albumin: 4.4 g/dL (ref 3.7–4.7)
Alkaline Phosphatase: 55 IU/L (ref 44–121)
BUN/Creatinine Ratio: 19 (ref 12–28)
BUN: 16 mg/dL (ref 8–27)
Bilirubin Total: 0.3 mg/dL (ref 0.0–1.2)
CO2: 24 mmol/L (ref 20–29)
Calcium: 9.4 mg/dL (ref 8.7–10.3)
Chloride: 103 mmol/L (ref 96–106)
Creatinine, Ser: 0.84 mg/dL (ref 0.57–1.00)
Globulin, Total: 2.3 g/dL (ref 1.5–4.5)
Glucose: 98 mg/dL (ref 70–99)
Potassium: 4.5 mmol/L (ref 3.5–5.2)
Sodium: 143 mmol/L (ref 134–144)
Total Protein: 6.7 g/dL (ref 6.0–8.5)
eGFR: 71 mL/min/{1.73_m2} (ref >59)

## 2021-07-05 LAB — LIPID PANEL
Chol/HDL Ratio: 2 ratio (ref 0.0–4.4)
Cholesterol, Total: 155 mg/dL (ref 100–199)
HDL: 78 mg/dL (ref >39)
LDL Chol Calc (NIH): 57 mg/dL (ref 0–99)
Triglycerides: 114 mg/dL (ref 0–149)
VLDL Cholesterol Cal: 20 mg/dL (ref 5–40)

## 2021-07-05 LAB — TSH: TSH: 1.36 u[IU]/mL (ref 0.450–4.500)

## 2021-07-05 LAB — T4, FREE: Free T4: 0.89 ng/dL (ref 0.82–1.77)

## 2021-07-07 DIAGNOSIS — M5432 Sciatica, left side: Secondary | ICD-10-CM | POA: Diagnosis not present

## 2021-07-07 DIAGNOSIS — M9905 Segmental and somatic dysfunction of pelvic region: Secondary | ICD-10-CM | POA: Diagnosis not present

## 2021-07-07 DIAGNOSIS — M545 Low back pain, unspecified: Secondary | ICD-10-CM | POA: Diagnosis not present

## 2021-07-07 DIAGNOSIS — M9903 Segmental and somatic dysfunction of lumbar region: Secondary | ICD-10-CM | POA: Diagnosis not present

## 2021-07-09 DIAGNOSIS — M9905 Segmental and somatic dysfunction of pelvic region: Secondary | ICD-10-CM | POA: Diagnosis not present

## 2021-07-09 DIAGNOSIS — M5432 Sciatica, left side: Secondary | ICD-10-CM | POA: Diagnosis not present

## 2021-07-09 DIAGNOSIS — M545 Low back pain, unspecified: Secondary | ICD-10-CM | POA: Diagnosis not present

## 2021-07-09 DIAGNOSIS — M9903 Segmental and somatic dysfunction of lumbar region: Secondary | ICD-10-CM | POA: Diagnosis not present

## 2021-07-12 ENCOUNTER — Other Ambulatory Visit: Payer: Self-pay | Admitting: Physician Assistant

## 2021-07-12 ENCOUNTER — Other Ambulatory Visit: Payer: Self-pay | Admitting: Family Medicine

## 2021-07-12 DIAGNOSIS — N959 Unspecified menopausal and perimenopausal disorder: Secondary | ICD-10-CM

## 2021-07-12 DIAGNOSIS — M62838 Other muscle spasm: Secondary | ICD-10-CM

## 2021-07-13 ENCOUNTER — Other Ambulatory Visit: Payer: Self-pay | Admitting: Physician Assistant

## 2021-07-13 DIAGNOSIS — F411 Generalized anxiety disorder: Secondary | ICD-10-CM

## 2021-07-31 DIAGNOSIS — M65311 Trigger thumb, right thumb: Secondary | ICD-10-CM | POA: Diagnosis not present

## 2021-07-31 DIAGNOSIS — M65312 Trigger thumb, left thumb: Secondary | ICD-10-CM | POA: Diagnosis not present

## 2021-08-20 DIAGNOSIS — H353231 Exudative age-related macular degeneration, bilateral, with active choroidal neovascularization: Secondary | ICD-10-CM | POA: Diagnosis not present

## 2021-09-13 ENCOUNTER — Other Ambulatory Visit: Payer: Self-pay | Admitting: Physician Assistant

## 2021-09-13 DIAGNOSIS — F411 Generalized anxiety disorder: Secondary | ICD-10-CM

## 2021-09-13 DIAGNOSIS — M62838 Other muscle spasm: Secondary | ICD-10-CM

## 2021-09-22 ENCOUNTER — Ambulatory Visit: Payer: Self-pay | Admitting: *Deleted

## 2021-09-22 NOTE — Telephone Encounter (Signed)
Summary: taking medicine, estradiol (ESTRACE) 0.5 MG tablet differently   Patient called in state is taking estradiol (ESTRACE) 0.5 MG tablet, previously prescribed by Dr Sherrie Mustache. She was taking one every other day, for hot flashes, but says that didn't help, so she has started taking 1 a day. Please call back to discuss        Chief Complaint: requesting medication order for taking estrace 0.5 mg every day for more refills instead of every other day  Symptoms: hot flashes. C/o when starting estrace 0.5 mg every other day patient started having "hot flashes" worse then before. Patient started taking estrace every day for several weeks now and feels some better with less "hot flashes".  Frequency: several weeks  Pertinent Negatives: Patient denies any other sx  Disposition: [] ED /[] Urgent Care (no appt availability in office) / [] Appointment(In office/virtual)/ []  [place] [facility]/ [] Home Care/ [] Refused Recommended Disposition /[] [place] [facility]/ [x]  Follow-up with PCP Additional Notes:   Please advise if patient needs another appt or if estrace can be ordered every day so when patient needs refill she will be able to get it.     Reason for Disposition  [1] Caller has NON-URGENT medicine question about med that PCP prescribed AND [2] triager unable to answer question  Answer Assessment - Initial Assessment Questions 1. NAME of MEDICINE: "What medicine(s) are you calling about?"     Estrace 0.5 mg  2. QUESTION: "What is your question?" (e.g., double dose of medicine, side effect)     Can estrace be ordered to take every day instead of every other day ? 3. PRESCRIBER: "Who prescribed the medicine?" Reason: if prescribed by specialist, call should be referred to that group.     PCP 4. SYMPTOMS: "Do you have any symptoms?" If Yes, ask: "What symptoms are you having?"  "How bad are the symptoms (e.g., mild, moderate, severe)     Hot flashes came back while taking every other day   5. PREGNANCY:  "Is there any chance that you are pregnant?" "When was your last menstrual period?"     na  Protocols used: Medication Question Call-A-AH

## 2021-09-22 NOTE — Telephone Encounter (Signed)
Summary: taking medicine, estradiol (ESTRACE) 0.5 MG tablet differently   Patient called in state is taking estradiol (ESTRACE) 0.5 MG tablet, previously prescribed by Dr Sherrie Mustache. She was taking one every other day, for hot flashes, but says that didn't help, so she has started taking 1 a day. Please call back to discuss       Called patient to review medication questions and sx. No answer, LVMTCB [phone].

## 2021-09-24 NOTE — Telephone Encounter (Signed)
Patient is calling back because she had not gotten a response- she states she did not say she specifically wanted to see Dr Sherrie Mustache- but since he had changed her medication- she thought he may want to follow up. Patient has been scheduled with Mardella Layman for medication follow up appointment . She has no problem seeing her- and although she is aware of risk factors is willing to discuss with provider.

## 2021-09-25 NOTE — Progress Notes (Deleted)
     I,Sha'taria Satoya Feeley,acting as a Neurosurgeon for [employer], PA-C.,have documented all relevant documentation on the behalf of [name], PA-C,as directed by  [name], PA-C while in the presence of [name], PA-C.   Established patient visit   Patient: Jamie [name]   DOB: 1940-10-23   81 y.o. Female  MRN: [id number] Visit Date: 09/26/2021  Today's healthcare provider: [name], PA-C   No chief complaint on file.  Subjective    HPI  Follow up for menopausal and perimenopausal disorder  The patient was last seen for this 3 months ago. Changes made at last visit include discussed recommendations to take as low of a dose as possible for as short of a duration as possible. Will cut down to  estradiol (ESTRACE) 0.5 MG tablet; Take 1 tablet (0.5 mg total) by mouth every other day.  She reports {excellent/good/fair/poor:19665} compliance with treatment. She feels that condition is {improved/worse/unchanged:3041574}. She {is/is not:21021397} having side effects. ***  -----------------------------------------------------------------------------------------   Medications: Outpatient Medications Prior to Visit  Medication Sig   Acetaminophen-Caffeine (TENSION HEADACHE) 500-65 MG TABS Take 2 tablets by mouth daily as needed (headaches).   ALPRAZolam (XANAX) 0.25 MG tablet TAKE 0.5 TABLETS (0.125 MG TOTAL) BY MOUTH AT BEDTIME AS NEEDED FOR ANXIETY.   b complex vitamins tablet Take 1 tablet by mouth daily.   cetirizine (ZYRTEC) 10 MG tablet Take 10 mg by mouth daily. As needed   Cholecalciferol (VITAMIN D) 50 MCG (2000 UT) tablet Take 2,000 Units by mouth daily.   esomeprazole (NEXIUM) 20 MG capsule Take 20 mg by mouth daily.   estradiol (ESTRACE) 0.5 MG tablet Take 1 tablet (0.5 mg total) by mouth every other day.   furosemide (LASIX) 20 MG tablet TAKE 1 TABLET BY MOUTH EVERY DAY (Patient not taking: Reported on 06/23/2021)   meloxicam (MOBIC) 7.5 MG tablet TAKE 1  TABLET BY MOUTH DAILY AS NEEDED.   MULTIPLE VITAMIN PO Take 1 tablet by mouth daily.    Multiple Vitamins-Minerals (PRESERVISION AREDS 2 PO) Take 1 tablet by mouth 2 (two) times daily.   nortriptyline (PAMELOR) 50 MG capsule TAKE 1 CAPSULE BY MOUTH EVERYDAY AT BEDTIME   rosuvastatin (CRESTOR) 5 MG tablet TAKE 1 TABLET (5 MG TOTAL) BY MOUTH DAILY.   SYNTHROID 137 MCG tablet TAKE 1 TABLET BY MOUTH EVERY DAY BEFORE BREAKFAST   tiZANidine (ZANAFLEX) 4 MG tablet TAKE 0.5-1 TABLETS (2-4 MG TOTAL) BY MOUTH EVERY 6 (SIX) HOURS AS NEEDED FOR MUSCLE SPASMS.   triamcinolone cream (KENALOG) 0.1 % APPLY TO AFFECTED AREA TWICE A DAY   vitamin E 1000 UNIT capsule Take 1,000 Units by mouth daily.   No facility-administered medications prior to visit.    Review of Systems  {Labs  Heme  Chem  Endocrine  Serology  Results Review (optional):23779}   Objective    There were no vitals taken for this visit. {Show previous vital signs (optional):23777}  Physical Exam  ***  No results found for any visits on 09/26/21.  Assessment & Plan     ***  No follow-ups on file.      {provider attestation***:1}   [name], PA-C  [facility] [phone] (phone) [phone] (fax)  [facility]

## 2021-09-26 ENCOUNTER — Ambulatory Visit: Payer: PPO | Admitting: Physician Assistant

## 2021-09-30 DIAGNOSIS — M9905 Segmental and somatic dysfunction of pelvic region: Secondary | ICD-10-CM | POA: Diagnosis not present

## 2021-09-30 DIAGNOSIS — M9903 Segmental and somatic dysfunction of lumbar region: Secondary | ICD-10-CM | POA: Diagnosis not present

## 2021-09-30 DIAGNOSIS — M5432 Sciatica, left side: Secondary | ICD-10-CM | POA: Diagnosis not present

## 2021-09-30 DIAGNOSIS — M545 Low back pain, unspecified: Secondary | ICD-10-CM | POA: Diagnosis not present

## 2021-10-02 NOTE — Progress Notes (Signed)
Established patient visit  I,Joseline E Rosas,acting as a scribe for [employer], PA-C.,have documented all relevant documentation on the behalf of [name], PA-C,as directed by  [name], PA-C while in the presence of [name], PA-C.   Patient: Jamie [name]   DOB: [birth year]   81 y.o. Female  MRN: [id number] Visit Date: 10/03/2021  Today's healthcare provider: [name], PA-C   Chief Complaint  Patient presents with   Follow-up   Subjective    HPI  Follow up for Menopausal and Perimenopausal disorder  The patient was last seen for this 3 months ago. Changes made at last visit include low of a dose as possible for as short of a duration as possible. Will cut down to  estradiol (ESTRACE) 0.5 MG tablet; Take 1 tablet (0.5 mg total) by mouth every other day. .  She reports poor compliance with treatment. She feels that condition is Unchanged. She is having side effects. Reports a lot of hot flashes and not able to sleep. So she went back to take 1 (one) tablet a day. Tried every other day for about a week.  Wt Readings from Last 3 Encounters:  10/03/21 142 lb (64.4 kg)  06/23/21 143 lb (64.9 kg)  12/24/20 144 lb 3.2 oz (65.4 kg)    -----------------------------------------------------------------------------------------    Medications: Outpatient Medications Prior to Visit  Medication Sig   Acetaminophen-Caffeine (TENSION HEADACHE) 500-65 MG TABS Take 2 tablets by mouth daily as needed (headaches).   ALPRAZolam (XANAX) 0.25 MG tablet TAKE 0.5 TABLETS (0.125 MG TOTAL) BY MOUTH AT BEDTIME AS NEEDED FOR ANXIETY.   b complex vitamins tablet Take 1 tablet by mouth daily.   cetirizine (ZYRTEC) 10 MG tablet Take 10 mg by mouth daily. As needed   Cholecalciferol (VITAMIN D) 50 MCG (2000 UT) tablet Take 2,000 Units by mouth daily.   esomeprazole (NEXIUM) 20 MG capsule Take 20 mg by mouth daily.   furosemide (LASIX) 20 MG tablet TAKE 1 TABLET BY MOUTH  EVERY DAY   meloxicam (MOBIC) 7.5 MG tablet TAKE 1 TABLET BY MOUTH DAILY AS NEEDED.   MULTIPLE VITAMIN PO Take 1 tablet by mouth daily.    Multiple Vitamins-Minerals (PRESERVISION AREDS 2 PO) Take 1 tablet by mouth 2 (two) times daily.   rosuvastatin (CRESTOR) 5 MG tablet TAKE 1 TABLET (5 MG TOTAL) BY MOUTH DAILY.   SYNTHROID 137 MCG tablet TAKE 1 TABLET BY MOUTH EVERY DAY BEFORE BREAKFAST   tiZANidine (ZANAFLEX) 4 MG tablet TAKE 0.5-1 TABLETS (2-4 MG TOTAL) BY MOUTH EVERY 6 (SIX) HOURS AS NEEDED FOR MUSCLE SPASMS.   triamcinolone cream (KENALOG) 0.1 % APPLY TO AFFECTED AREA TWICE A DAY   vitamin E 1000 UNIT capsule Take 1,000 Units by mouth daily.   [DISCONTINUED] estradiol (ESTRACE) 0.5 MG tablet Take 1 tablet (0.5 mg total) by mouth every other day.   [DISCONTINUED] nortriptyline (PAMELOR) 50 MG capsule TAKE 1 CAPSULE BY MOUTH EVERYDAY AT BEDTIME   No facility-administered medications prior to visit.    Review of Systems  Constitutional:  Positive for diaphoresis. Negative for fatigue and fever.  Respiratory:  Negative for cough and shortness of breath.   Cardiovascular:  Negative for chest pain and leg swelling.  Gastrointestinal:  Negative for abdominal pain.  Neurological:  Negative for dizziness and headaches.       Objective    BP (!) 127/48 (BP Location: Left Arm, Patient Position: Sitting, Cuff Size: Normal)   Pulse 82  Temp 98.1 F (36.7 C) (Oral)   Resp 16   Wt 142 lb (64.4 kg)   BMI 23.27 kg/m    Physical Exam Constitutional:      General: She is awake.     Appearance: She is well-developed.  HENT:     Head: Normocephalic.  Eyes:     Conjunctiva/sclera: Conjunctivae normal.  Cardiovascular:     Rate and Rhythm: Normal rate and regular rhythm.     Heart sounds: Normal heart sounds.  Pulmonary:     Effort: Pulmonary effort is normal.     Breath sounds: Normal breath sounds.  Skin:    General: Skin is warm.  Neurological:     Mental Status: She is alert  and oriented to person, place, and time.  Psychiatric:        Attention and Perception: Attention normal.        Mood and Affect: Mood normal.        Speech: Speech normal.        Behavior: Behavior is cooperative.      No results found for any visits on 10/03/21.  Assessment & Plan     Problem List Items Addressed This Visit       Other   Anxiety state    Manages with nortriptyline at night, and xanax prn, usually takes 1/2 0.25 mg tablet.        Relevant Medications   nortriptyline (PAMELOR) 50 MG capsule   Menopausal and perimenopausal disorder - Primary    Tried cutting down to 0.5 mg every other day, but was unable. Discussion on side effects /risks of long term estrogen therapy-- ie breast cancer. Pt does self breast exams and gets annual mammograms. Refilled for daily again.       Relevant Medications   estradiol (ESTRACE) 0.5 MG tablet     Return in about 6 months (around 04/05/2022) for chronic conditions.     I, [name], PA-C have reviewed all documentation for this visit. The documentation on  10/03/2021 for the exam, diagnosis, procedures, and orders are all accurate and complete.  [name], PA-C [facility] [address] [place], [place], [address] Office: [phone] Fax: [phone]   [facility]

## 2021-10-03 ENCOUNTER — Encounter: Payer: Self-pay | Admitting: Physician Assistant

## 2021-10-03 ENCOUNTER — Ambulatory Visit (INDEPENDENT_AMBULATORY_CARE_PROVIDER_SITE_OTHER): Payer: PPO | Admitting: Physician Assistant

## 2021-10-03 VITALS — BP 127/48 | HR 82 | Temp 98.1°F | Resp 16 | Wt 142.0 lb

## 2021-10-03 DIAGNOSIS — F411 Generalized anxiety disorder: Secondary | ICD-10-CM

## 2021-10-03 DIAGNOSIS — N959 Unspecified menopausal and perimenopausal disorder: Secondary | ICD-10-CM

## 2021-10-03 MED ORDER — ESTRADIOL 0.5 MG PO TABS: 0.5000 mg | ORAL_TABLET | Freq: Every day | ORAL | 1 refills | Status: DC

## 2021-10-03 MED ORDER — NORTRIPTYLINE HCL 50 MG PO CAPS: ORAL_CAPSULE | ORAL | 1 refills | Status: DC

## 2021-10-03 NOTE — Assessment & Plan Note (Signed)
Tried cutting down to 0.5 mg every other day, but was unable. Discussion on side effects /risks of long term estrogen therapy-- ie breast cancer. Pt does self breast exams and gets annual mammograms. Refilled for daily again.

## 2021-10-03 NOTE — Assessment & Plan Note (Signed)
Manages with nortriptyline at night, and xanax prn, usually takes 1/2 0.25 mg tablet.

## 2021-10-14 ENCOUNTER — Other Ambulatory Visit: Payer: Self-pay | Admitting: Physician Assistant

## 2021-10-14 DIAGNOSIS — N959 Unspecified menopausal and perimenopausal disorder: Secondary | ICD-10-CM

## 2021-11-01 ENCOUNTER — Other Ambulatory Visit: Payer: Self-pay | Admitting: Physician Assistant

## 2021-11-01 DIAGNOSIS — F411 Generalized anxiety disorder: Secondary | ICD-10-CM

## 2021-11-01 DIAGNOSIS — E034 Atrophy of thyroid (acquired): Secondary | ICD-10-CM

## 2021-11-01 DIAGNOSIS — N959 Unspecified menopausal and perimenopausal disorder: Secondary | ICD-10-CM

## 2021-11-24 DIAGNOSIS — M9905 Segmental and somatic dysfunction of pelvic region: Secondary | ICD-10-CM | POA: Diagnosis not present

## 2021-11-24 DIAGNOSIS — M9903 Segmental and somatic dysfunction of lumbar region: Secondary | ICD-10-CM | POA: Diagnosis not present

## 2021-11-24 DIAGNOSIS — M545 Low back pain, unspecified: Secondary | ICD-10-CM | POA: Diagnosis not present

## 2021-11-24 DIAGNOSIS — M5432 Sciatica, left side: Secondary | ICD-10-CM | POA: Diagnosis not present

## 2021-11-25 MED ORDER — ALPRAZOLAM 0.25 MG PO TABS: 0.1250 mg | ORAL_TABLET | Freq: Every evening | ORAL | 1 refills | Status: DC | PRN

## 2021-11-25 NOTE — Telephone Encounter (Signed)
Requested medication (s) are due for refill today: Yes  Requested medication (s) are on the active medication list: Yes  Last refill:  11/03/21  Future visit scheduled: No  Notes to clinic:  Unable to refill per protocol, cannot delegate. Pharmacy did not receive, resend.     Requested Prescriptions  Pending Prescriptions Disp Refills   ALPRAZolam (XANAX) 0.25 MG tablet 30 tablet 1    Sig: Take 0.5 tablets (0.125 mg total) by mouth at bedtime as needed for anxiety.     Not Delegated - Psychiatry: Anxiolytics/Hypnotics 2 Failed - 11/25/2021 12:35 PM      Failed - This refill cannot be delegated      Failed - Urine Drug Screen completed in last 360 days      Passed - Patient is not pregnant      Passed - Valid encounter within last 6 months    Recent Outpatient Visits           1 month ago Menopausal and perimenopausal disorder   [employer], [place], PA-C   5 months ago Hypothyroidism due to acquired atrophy of thyroid   [facility] Malva Limes, MD   11 months ago Essential (primary) hypertension   [facility], Marzella Schlein, [place]   1 year ago Annual physical exam   [facility] [place], Alessandra Bevels, [place]   1 year ago Anxiety state   [facility] [place], [place], [place]              Signed Prescriptions Disp Refills   estradiol (ESTRACE) 0.5 MG tablet 90 tablet 1    Sig: TAKE 1 TABLET BY MOUTH EVERY DAY     OB/GYN:  Estrogens Failed - 11/01/2021  7:20 PM      Failed - Mammogram is up-to-date per Health Maintenance      Failed - Last BP in normal range    BP Readings from Last 1 Encounters:  10/03/21 (!) 127/48         Passed - Valid encounter within last 12 months    Recent Outpatient Visits           1 month ago Menopausal and perimenopausal disorder   [employer], [place], PA-C   5 months ago Hypothyroidism due to acquired atrophy of thyroid    [facility] Malva Limes, MD   11 months ago Essential (primary) hypertension   [facility], Marzella Schlein, [place]   1 year ago Annual physical exam   [facility] Joycelyn Man M, [place]   1 year ago Anxiety state   [facility] [place], Victorino Dike M, PA-C               ALPRAZolam Prudy Feeler) 0.25 MG tablet 30 tablet 1    Sig: Take 0.5 tablets (0.125 mg total) by mouth at bedtime as needed for anxiety.     Not Delegated - Psychiatry: Anxiolytics/Hypnotics 2 Failed - 11/01/2021  7:20 PM      Failed - This refill cannot be delegated      Failed - Urine Drug Screen completed in last 360 days      Passed - Patient is not pregnant      Passed - Valid encounter within last 6 months    Recent Outpatient Visits           1 month ago Menopausal and perimenopausal disorder   [facility] Ok Edwards, [place], [place]  5 months ago Hypothyroidism due to acquired atrophy of thyroid   [facility] Birdie Sons, [place]   11 months ago Essential (primary) hypertension   [facility], Dionne Bucy, [place]   1 year ago Annual physical exam   [place], Clearnce Sorrel, [place]   1 year ago [place] Fenton Malling M, PA-C               SYNTHROID 137 MCG tablet 90 tablet 1    Sig: TAKE 1 TABLET BY MOUTH EVERY [place]     Endocrinology:  Hypothyroid Agents Passed - 11/01/2021  7:20 PM      Passed - TSH in normal range and within 360 days    TSH  Date Value Ref Range Status  07/04/2021 1.360 0.450 - 4.500 uIU/mL Final         Passed - Valid encounter within last 12 months    Recent Outpatient Visits           1 month ago Menopausal and perimenopausal disorder   [facility] Thedore Mins, [place], PA-C   5 months ago Hypothyroidism due to acquired atrophy of thyroid   [facility]  Birdie Sons, [place]   11 months ago Essential (primary) hypertension   [employer], Dionne Bucy, [place]   1 year ago Annual physical exam   [place], Clearnce Sorrel, [place]   1 year ago [place], [place], [place]

## 2021-11-25 NOTE — Telephone Encounter (Signed)
Pt called in and stated she has been unable to pick up her medication, ALPRAZolam (XANAX) 0.25 MG tablet. The pharmacy advised her provider's approval is needed.  I reached out to the pharmacy directly and was advised they just needed refills; they have not received any refills. The patient stated the medication was last filled on 7/24. Pt asked why it is no longer called in for 90 days.   Pt is out of the medication.     Please advise.

## 2021-11-25 NOTE — Addendum Note (Signed)
Addended by: Matilde Sprang on: 11/25/2021 12:35 PM   Modules accepted: Orders

## 2021-11-25 NOTE — Telephone Encounter (Signed)
[facility] called and spoke to [place], [facility] about the refill(s) alprazolam requested. Advised it was sent on 11/03/21 #30/1 refill(s). She says they did not receive it on 11/03/21. Advised I will send to the provider to refill.

## 2021-12-10 DIAGNOSIS — H43823 Vitreomacular adhesion, bilateral: Secondary | ICD-10-CM | POA: Diagnosis not present

## 2021-12-10 DIAGNOSIS — H353231 Exudative age-related macular degeneration, bilateral, with active choroidal neovascularization: Secondary | ICD-10-CM | POA: Diagnosis not present

## 2021-12-24 ENCOUNTER — Other Ambulatory Visit: Payer: Self-pay | Admitting: Physician Assistant

## 2021-12-24 ENCOUNTER — Other Ambulatory Visit: Payer: Self-pay | Admitting: Family Medicine

## 2021-12-24 ENCOUNTER — Ambulatory Visit: Payer: PPO | Admitting: Physician Assistant

## 2021-12-24 DIAGNOSIS — Z1231 Encounter for screening mammogram for malignant neoplasm of breast: Secondary | ICD-10-CM

## 2021-12-24 NOTE — Progress Notes (Unsigned)
I,Sha'taria Tyson,acting as a Neurosurgeon for [employer], PA-C.,have documented all relevant documentation on the behalf of [name], PA-C,as directed by  [name], PA-C while in the presence of [name], PA-C.   Established patient visit   Patient: Jamie [name]   DOB: 06/07/1940   81 y.o. Female  MRN: [id number] Visit Date: 12/25/2021  Today's healthcare provider: [name], PA-C   Cc. Left lower extremity pain x 2 weeks  Subjective    HPI   Pt reports intermittent L lower extremity pain x 2 weeks. Describes as an aching, and noticed some redness to her L calf. Denies pain w/ movement. Reports ongoing L sciatica pain that runs down her L buttock and hamstring.   Reports chronic edema, managed w/ diuretic. Edema has not been worse than usual. Medications: Outpatient Medications Prior to Visit  Medication Sig   Acetaminophen-Caffeine (TENSION HEADACHE) 500-65 MG TABS Take 2 tablets by mouth daily as needed (headaches).   ALPRAZolam (XANAX) 0.25 MG tablet Take 0.5 tablets (0.125 mg total) by mouth at bedtime as needed for anxiety.   b complex vitamins tablet Take 1 tablet by mouth daily.   cetirizine (ZYRTEC) 10 MG tablet Take 10 mg by mouth daily. As needed   Cholecalciferol (VITAMIN D) 50 MCG (2000 UT) tablet Take 2,000 Units by mouth daily.   esomeprazole (NEXIUM) 20 MG capsule Take 20 mg by mouth daily.   estradiol (ESTRACE) 0.5 MG tablet TAKE 1 TABLET BY MOUTH EVERY DAY   furosemide (LASIX) 20 MG tablet TAKE 1 TABLET BY MOUTH EVERY DAY   meloxicam (MOBIC) 7.5 MG tablet TAKE 1 TABLET BY MOUTH DAILY AS NEEDED.   MULTIPLE VITAMIN PO Take 1 tablet by mouth daily.    Multiple Vitamins-Minerals (PRESERVISION AREDS 2 PO) Take 1 tablet by mouth 2 (two) times daily.   nortriptyline (PAMELOR) 50 MG capsule TAKE 1 CAPSULE BY MOUTH EVERYDAY AT BEDTIME   rosuvastatin (CRESTOR) 5 MG tablet TAKE 1 TABLET (5 MG TOTAL) BY MOUTH DAILY.   SYNTHROID 137 MCG tablet  TAKE 1 TABLET BY MOUTH EVERY DAY BEFORE BREAKFAST   tiZANidine (ZANAFLEX) 4 MG tablet TAKE 0.5-1 TABLETS (2-4 MG TOTAL) BY MOUTH EVERY 6 (SIX) HOURS AS NEEDED FOR MUSCLE SPASMS.   triamcinolone cream (KENALOG) 0.1 % APPLY TO AFFECTED AREA TWICE A DAY   vitamin E 1000 UNIT capsule Take 1,000 Units by mouth daily.   No facility-administered medications prior to visit.    Review of Systems  Constitutional:  Negative for fatigue and fever.  Respiratory:  Negative for cough and shortness of breath.   Cardiovascular:  Negative for chest pain and leg swelling.  Gastrointestinal:  Negative for abdominal pain.  Neurological:  Negative for dizziness and headaches.      Objective    BP (!) 149/57 (BP Location: Right Arm, Patient Position: Sitting, Cuff Size: Normal)   Pulse 80   Ht 5\' 5"  (1.651 m)   Wt 139 lb 4.8 oz (63.2 kg)   SpO2 100%   BMI 23.18 kg/m    Physical Exam Vitals reviewed.  Constitutional:      Appearance: She is not ill-appearing.  HENT:     Head: Normocephalic.  Eyes:     Conjunctiva/sclera: Conjunctivae normal.  Cardiovascular:     Rate and Rhythm: Normal rate.  Pulmonary:     Effort: Pulmonary effort is normal. No respiratory distress.  Musculoskeletal:     Comments: Trace edema L lower extremity. Some mild  redness vs chronic hyperpigmentation skin changes to L calf. Nontender, not warm to touch.  Neurological:     General: No focal deficit present.     Mental Status: She is alert and oriented to person, place, and time.  Psychiatric:        Mood and Affect: Mood normal.        Behavior: Behavior normal.     No results found for any visits on 12/25/21.  Assessment & Plan     Left calf pain Advised we do a doppler US L lower extremity to r/o DVT, although low suspicion Advised likely it is her sciatic pain now traveling down her calf.  Discussed ways to improve sciatic pain Caution for increased pain, swelling, redness.  Return in about 6 months  (around 06/25/2022) for CPE, AVW.      I, Mikey Kirschner, PA-C have reviewed all documentation for this visit. The documentation on  12/25/2021  for the exam, diagnosis, procedures, and orders are all accurate and complete.  Mikey Kirschner, PA-C [facility] [address] [place], [place], [address] Office: [phone] Fax: [place]

## 2021-12-25 ENCOUNTER — Encounter: Payer: Self-pay | Admitting: Physician Assistant

## 2021-12-25 ENCOUNTER — Ambulatory Visit (INDEPENDENT_AMBULATORY_CARE_PROVIDER_SITE_OTHER): Payer: PPO | Admitting: Physician Assistant

## 2021-12-25 VITALS — BP 149/57 | HR 80 | Ht 65.0 in | Wt 139.3 lb

## 2021-12-25 DIAGNOSIS — M79662 Pain in left lower leg: Secondary | ICD-10-CM

## 2021-12-26 ENCOUNTER — Ambulatory Visit
Admission: RE | Admit: 2021-12-26 | Discharge: 2021-12-26 | Disposition: A | Discharge status: Home / Self Care | Payer: PPO | Source: Ambulatory Visit | Attending: Physician Assistant | Admitting: Physician Assistant

## 2021-12-26 DIAGNOSIS — M79662 Pain in left lower leg: Secondary | ICD-10-CM | POA: Insufficient documentation

## 2022-01-07 DIAGNOSIS — M9903 Segmental and somatic dysfunction of lumbar region: Secondary | ICD-10-CM | POA: Diagnosis not present

## 2022-01-07 DIAGNOSIS — M545 Low back pain, unspecified: Secondary | ICD-10-CM | POA: Diagnosis not present

## 2022-01-07 DIAGNOSIS — M5432 Sciatica, left side: Secondary | ICD-10-CM | POA: Diagnosis not present

## 2022-01-07 DIAGNOSIS — M9905 Segmental and somatic dysfunction of pelvic region: Secondary | ICD-10-CM | POA: Diagnosis not present

## 2022-01-19 DIAGNOSIS — M545 Low back pain, unspecified: Secondary | ICD-10-CM | POA: Diagnosis not present

## 2022-01-19 DIAGNOSIS — M5432 Sciatica, left side: Secondary | ICD-10-CM | POA: Diagnosis not present

## 2022-01-19 DIAGNOSIS — M9903 Segmental and somatic dysfunction of lumbar region: Secondary | ICD-10-CM | POA: Diagnosis not present

## 2022-01-19 DIAGNOSIS — M9905 Segmental and somatic dysfunction of pelvic region: Secondary | ICD-10-CM | POA: Diagnosis not present

## 2022-01-21 ENCOUNTER — Other Ambulatory Visit: Payer: Self-pay | Admitting: Physician Assistant

## 2022-01-21 DIAGNOSIS — M5432 Sciatica, left side: Secondary | ICD-10-CM | POA: Diagnosis not present

## 2022-01-21 DIAGNOSIS — M9903 Segmental and somatic dysfunction of lumbar region: Secondary | ICD-10-CM | POA: Diagnosis not present

## 2022-01-21 DIAGNOSIS — M9905 Segmental and somatic dysfunction of pelvic region: Secondary | ICD-10-CM | POA: Diagnosis not present

## 2022-01-21 DIAGNOSIS — M545 Low back pain, unspecified: Secondary | ICD-10-CM | POA: Diagnosis not present

## 2022-01-26 NOTE — Progress Notes (Unsigned)
I,Jamie [name],acting as a Neurosurgeon for [employer], PA-C.,have documented all relevant documentation on the behalf of Jamie Ferguson, PA-C,as directed by  Jamie Ferguson, PA-C while in the presence of Jamie Ferguson, PA-C.   Established patient visit   Patient: Jamie [name]   DOB: Mar 10, 1940   81 y.o. Female  MRN: [id number] Visit Date: 01/27/2022  Today's healthcare provider: [name], PA-C   Chief Complaint  Patient presents with   Leg Pain        Subjective    HPI  Eczema: Patient complains of rash on palm of right hand and right thumb.  Onset of symptoms a month ago, and have been gradually worsening since that time.. Treatment modalities that have been used in the past include: topical steroids. No improvement with clobetasol or triamcinolone.    Pain in Left Leg   She reports recurrent left lower leg/ankle pain. was not an injury that may have caused the pain. The pain started about a month ago and is staying constant. The pain does not radiate . The pain is described as burning, is 7/10 in intensity, occurring intermittently.  Aggravating factors: none Relieving factors: none.  She has tried NSAIDs with no relief.  Pain is now associated with redness, warmth, and edema. ---------------------------------------------------------------------------------------------------   Medications: Outpatient Medications Prior to Visit  Medication Sig   Acetaminophen-Caffeine (TENSION HEADACHE) 500-65 MG TABS Take 2 tablets by mouth daily as needed (headaches).   ALPRAZolam (XANAX) 0.25 MG tablet Take 0.5 tablets (0.125 mg total) by mouth at bedtime as needed for anxiety.   b complex vitamins tablet Take 1 tablet by mouth daily.   cetirizine (ZYRTEC) 10 MG tablet Take 10 mg by mouth daily. As needed   Cholecalciferol (VITAMIN D) 50 MCG (2000 UT) tablet Take 2,000 Units by mouth daily.   esomeprazole (NEXIUM) 20 MG capsule Take 20 mg by mouth daily.   estradiol  (ESTRACE) 0.5 MG tablet TAKE 1 TABLET BY MOUTH EVERY DAY   furosemide (LASIX) 20 MG tablet TAKE 1 TABLET BY MOUTH EVERY DAY   meloxicam (MOBIC) 7.5 MG tablet TAKE 1 TABLET BY MOUTH DAILY AS NEEDED.   MULTIPLE VITAMIN PO Take 1 tablet by mouth daily.    Multiple Vitamins-Minerals (PRESERVISION AREDS 2 PO) Take 1 tablet by mouth 2 (two) times daily.   nortriptyline (PAMELOR) 50 MG capsule TAKE 1 CAPSULE BY MOUTH EVERYDAY AT BEDTIME   rosuvastatin (CRESTOR) 5 MG tablet TAKE 1 TABLET (5 MG TOTAL) BY MOUTH DAILY.   SYNTHROID 137 MCG tablet TAKE 1 TABLET BY MOUTH EVERY DAY BEFORE BREAKFAST   tiZANidine (ZANAFLEX) 4 MG tablet TAKE 0.5-1 TABLETS (2-4 MG TOTAL) BY MOUTH EVERY 6 (SIX) HOURS AS NEEDED FOR MUSCLE SPASMS.   triamcinolone cream (KENALOG) 0.1 % APPLY TO AFFECTED AREA TWICE A DAY   vitamin E 1000 UNIT capsule Take 1,000 Units by mouth daily.   No facility-administered medications prior to visit.    Review of Systems  Constitutional:  Negative for fatigue and fever.  Respiratory:  Negative for cough and shortness of breath.   Cardiovascular:  Negative for chest pain and leg swelling.  Gastrointestinal:  Negative for abdominal pain.  Musculoskeletal:  Positive for arthralgias.  Skin:  Positive for color change and rash.  Neurological:  Negative for dizziness and headaches.     Objective    Blood pressure (!) 127/57, pulse 66, temperature (!) 97.2 F (36.2 C), temperature source Oral, weight 141 lb (64 kg), SpO2  100 %.   Physical Exam Vitals reviewed.  Constitutional:      Appearance: She is not ill-appearing.  HENT:     Head: Normocephalic.  Eyes:     Conjunctiva/sclera: Conjunctivae normal.  Cardiovascular:     Rate and Rhythm: Normal rate.  Pulmonary:     Effort: Pulmonary effort is normal. No respiratory distress.  Skin:    Comments: Right palm with excoriated thin skin, some bleeding. Similar area on her right thumb.   Left ankle w/ warm erythematous skin, tender, and  pitting edema  Neurological:     General: No focal deficit present.     Mental Status: She is alert and oriented to person, place, and time.  Psychiatric:        Mood and Affect: Mood normal.        Behavior: Behavior normal.      No results found for any visits on 01/27/22.  Assessment & Plan     Left leg cellulitis When pt initially presented there was no pitting edema, warmth and only very limited erythema and pt reported pain. Today with more erythema, tenderness to touch, warmth and pitting edema of the ankle. Ultrasound 12/26/21-- No evidence of left lower extremity deep venous thrombosis.  Treating w/ keflex qid x 7 days. Pt has pcn allergy-- rash. Advised of potential cross reaction.  2. Eczema No improvement with topical corticosteroids We can try tacrolimus topically   Return if symptoms worsen or fail to improve.      I, Jamie Ferguson, PA-C have reviewed all documentation for this visit. The documentation on  01/27/2022  for the exam, diagnosis, procedures, and orders are all accurate and complete.  Jamie Ferguson, PA-C [facility] [address] [place], [place], [address] Office: [phone] Fax: [phone]   [facility]

## 2022-01-27 ENCOUNTER — Ambulatory Visit (INDEPENDENT_AMBULATORY_CARE_PROVIDER_SITE_OTHER): Payer: PPO | Admitting: Physician Assistant

## 2022-01-27 ENCOUNTER — Encounter: Payer: Self-pay | Admitting: Physician Assistant

## 2022-01-27 VITALS — BP 127/57 | HR 66 | Temp 97.2°F | Wt 141.0 lb

## 2022-01-27 DIAGNOSIS — L309 Dermatitis, unspecified: Secondary | ICD-10-CM

## 2022-01-27 DIAGNOSIS — L03116 Cellulitis of left lower limb: Secondary | ICD-10-CM

## 2022-01-27 MED ORDER — CEPHALEXIN 500 MG PO CAPS: 500.0000 mg | ORAL_CAPSULE | Freq: Four times a day (QID) | ORAL | 0 refills | Status: AC

## 2022-01-27 MED ORDER — TACROLIMUS 0.03 % EX OINT: TOPICAL_OINTMENT | Freq: Two times a day (BID) | CUTANEOUS | 0 refills | Status: DC

## 2022-02-04 DIAGNOSIS — M9903 Segmental and somatic dysfunction of lumbar region: Secondary | ICD-10-CM | POA: Diagnosis not present

## 2022-02-04 DIAGNOSIS — M5432 Sciatica, left side: Secondary | ICD-10-CM | POA: Diagnosis not present

## 2022-02-04 DIAGNOSIS — M9905 Segmental and somatic dysfunction of pelvic region: Secondary | ICD-10-CM | POA: Diagnosis not present

## 2022-02-04 DIAGNOSIS — M545 Low back pain, unspecified: Secondary | ICD-10-CM | POA: Diagnosis not present

## 2022-02-06 ENCOUNTER — Ambulatory Visit: Payer: Self-pay

## 2022-02-06 DIAGNOSIS — M25572 Pain in left ankle and joints of left foot: Secondary | ICD-10-CM | POA: Diagnosis not present

## 2022-02-06 DIAGNOSIS — M25472 Effusion, left ankle: Secondary | ICD-10-CM | POA: Diagnosis not present

## 2022-02-06 NOTE — Telephone Encounter (Signed)
Message from Em[place] sent at 02/06/2022 12:04 PM EST  Summary: lower leg pain   Pt called to schedule appt for lower leg pain but there were no appts available today with any of the providers. Pt stated she does not want to go to the ED and requests call back to advise of other options. Cb# [phone]          Chief Complaint: severe left lower leg pain (medial, above ankle Symptoms: redness, tender to touch, foot and ankle swelling, not painful when walking , but only with sitting Frequency: 4 weeks Pertinent Negatives: Patient denies denies calf pain Disposition: [] ED /[x] Urgent Care (no appt availability in office) / [] Appointment(In office/virtual)/ []  [place] [facility]/ [] Home Care/ [] Refused Recommended Disposition /[] [place] [facility]/ []  Follow-up with PCP Additional Notes: scheduled appt for pt at [facility] in [place].  Reason for Disposition  [1] SEVERE pain (e.g., excruciating, unable to do any normal activities) AND [2] not improved after 2 hours of pain medicine  Answer Assessment - Initial Assessment Questions 1. ONSET: "When did the pain start?"      4 weeks 2. LOCATION: "Where is the pain located?"      Left medial just above ankle 3. PAIN: "How bad is the pain?"    (Scale 1-10; or mild, moderate, severe)   -  MILD (1-3): doesn't interfere with normal activities    -  MODERATE (4-7): interferes with normal activities (e.g., work or school) or awakens from sleep, limping    -  SEVERE (8-10): excruciating pain, unable to do any normal activities, unable to walk     8 comes and goes- walking doesn't hurt- hurts when sitting 4. WORK OR EXERCISE: "Has there been any recent work or exercise that involved this part of the body?"     N/a 5. CAUSE: "What do you think is causing the leg pain?"    unsure 6. OTHER SYMPTOMS: "Do you have any other symptoms?" (e.g., chest pain, back pain, breathing difficulty, swelling, rash, fever, numbness, weakness)      Tender to touch - foot and ankle is swollen, redness 7. PREGNANCY: "Is there any chance you are pregnant?" "When was your last menstrual period?"     N/a  Protocols used: Leg Pain-A-AH

## 2022-02-06 NOTE — Telephone Encounter (Signed)
Agree with urgent care rec

## 2022-02-09 ENCOUNTER — Telehealth: Payer: Self-pay | Admitting: *Deleted

## 2022-02-09 NOTE — Telephone Encounter (Signed)
Copied from CRM [id number]. Topic: Referral - Request for Referral >> Feb 09, 2022  3:27 PM Franchot Heidelberg wrote: Has patient seen PCP for this complaint? Yes.   *If NO, is insurance requiring patient see PCP for this issue before PCP can refer them? Referral for which specialty: Vein & Vascular  Preferred provider/office: [facility]  Reason for referral: pain in lower left leg, PCP is aware of condition. Still has pain even after blood clots have been ruled out with imaging.

## 2022-02-09 NOTE — Telephone Encounter (Signed)
Please advise referral?  

## 2022-02-11 ENCOUNTER — Emergency Department: Payer: PPO

## 2022-02-11 ENCOUNTER — Encounter: Payer: Self-pay | Admitting: Emergency Medicine

## 2022-02-11 ENCOUNTER — Other Ambulatory Visit: Payer: Self-pay | Admitting: Physician Assistant

## 2022-02-11 ENCOUNTER — Emergency Department
Admission: EM | Admit: 2022-02-11 | Discharge: 2022-02-11 | Disposition: A | Discharge status: Home / Self Care | Payer: PPO | Attending: Emergency Medicine | Admitting: Emergency Medicine

## 2022-02-11 ENCOUNTER — Other Ambulatory Visit: Payer: Self-pay

## 2022-02-11 DIAGNOSIS — E039 Hypothyroidism, unspecified: Secondary | ICD-10-CM | POA: Insufficient documentation

## 2022-02-11 DIAGNOSIS — M79605 Pain in left leg: Secondary | ICD-10-CM | POA: Insufficient documentation

## 2022-02-11 DIAGNOSIS — L03116 Cellulitis of left lower limb: Secondary | ICD-10-CM

## 2022-02-11 DIAGNOSIS — R600 Localized edema: Secondary | ICD-10-CM

## 2022-02-11 DIAGNOSIS — M7989 Other specified soft tissue disorders: Secondary | ICD-10-CM | POA: Diagnosis not present

## 2022-02-11 MED ORDER — TRAMADOL HCL 50 MG PO TABS: 50.0000 mg | ORAL_TABLET | Freq: Four times a day (QID) | ORAL | 0 refills | Status: DC | PRN

## 2022-02-11 MED ORDER — CEPHALEXIN 500 MG PO CAPS: 500.0000 mg | ORAL_CAPSULE | Freq: Four times a day (QID) | ORAL | 0 refills | Status: AC

## 2022-02-11 MED ORDER — TRAMADOL HCL 50 MG PO TABS
50.0000 mg | ORAL_TABLET | Freq: Once | ORAL | Status: AC
  Administered 2022-02-11: 50 mg via ORAL
  Filled 2022-02-11: qty 1

## 2022-02-11 NOTE — ED Triage Notes (Signed)
Patient ambulatory to triage with steady gait, without difficulty or distress noted; pt reports left lower leg swelling & pain for last month; denies any injury or hx of same

## 2022-02-11 NOTE — ED Provider Notes (Signed)
[facility] Provider Note    Event Date/Time   First MD Initiated Contact with Patient 02/11/22 [phone]     (approximate)   History   Leg Pain   HPI  Jamie [name] is a 81 y.o. female with history of hyperlipidemia, hypothyroidism, and anxiety who presents with left leg pain which has been present for approximately last month.  The patient has an area to the inner left lower leg which has been painful.  She denies any trauma or injury.  The patient states that she was seen by primary care twice and was diagnosed with cellulitis and treated with antibiotic approximately a month ago.  She also went to [facility] although was told that it was likely not an orthopedic problem.  The patient states that the pain acutely worsened last night.  It previously had been relieved by Tylenol but was not last night.  The patient denies any fever or chills, pain or redness going up the leg, difficulty breathing, palpitations, or other acute symptoms.  I reviewed the medical records.  The patient's most recent outpatient encounter was on 01/27/2022 with [facility] family practice.  At that time she reported left leg pain that was atraumatic.  She was diagnosed with left leg cellulitis and was started on Keflex.  She had a negative DVT study on 11/3.   Physical Exam   Triage Vital Signs: ED Triage Vitals  Enc Vitals Group     BP 02/11/22 0631 (!) 151/68     Pulse Rate 02/11/22 0631 88     Resp 02/11/22 0631 18     Temp 02/11/22 0631 97.8 F (36.6 C)     Temp Source 02/11/22 0631 Oral     SpO2 02/11/22 0631 99 %     Weight 02/11/22 0633 138 lb (62.6 kg)     Height 02/11/22 0633 5\' 5"  (1.651 m)     Head Circumference --      Peak Flow --      Pain Score 02/11/22 0633 8     Pain Loc --      Pain Edu? --      Excl. in GC? --     Most recent vital signs: Vitals:   02/11/22 0631  BP: (!) 151/68  Pulse: 88  Resp: 18  Temp: 97.8 F (36.6 C)  SpO2: 99%      General: Alert, well-appearing. CV:  Good peripheral perfusion.  Resp:  Normal effort.  Abd:  No distention.  Other:  Left medial lower leg with approximately 5 cm area of erythema and mild localized swelling.  No abnormal warmth.  No calf or popliteal swelling or tenderness.  No redness or streaking going up the leg.  Full range of motion at ankle and toes.  Diminished DP pulse.  Normal cap refill.  Normal motor strength and sensation to the lower leg and foot.   ED Results / Procedures / Treatments   Labs (all labs ordered are listed, but only abnormal results are displayed) Labs Reviewed - No data to display   EKG     RADIOLOGY  02/13/22 venous LLE: I independently viewed and interpreted the images; there is no evidence of acute DVT    PROCEDURES:  Critical Care performed: No  Procedures   MEDICATIONS ORDERED IN ED: Medications  traMADol (ULTRAM) tablet 50 mg (has no administration in time range)     IMPRESSION / MDM / ASSESSMENT AND PLAN / ED COURSE  I reviewed  the triage vital signs and the nursing notes.  81 year old female with PMH as noted above presents with an area of left leg atraumatic pain for over a month.  She was treated for cellulitis last month although reports minimal relief at that time.  Differential diagnosis includes, but is not limited to, superficial thrombophlebitis, cellulitis, chronic muscle tendon strain, less likely DVT.  DVT study is pending.  The patient does have a decreased DP pulse to her left foot although no signs of acute arterial occlusion or other vascular insufficiency.  There may be an element of claudication.  If the DVT study is negative I we will treat for superficial thrombophlebitis and refer her to vascular surgery.  Patient's presentation is most consistent with acute complicated illness / injury requiring diagnostic workup.  ----------------------------------------- 8:42 AM on  02/11/2022 -----------------------------------------  Ultrasound is negative including for superficial thrombophlebitis.  Based on my examination, the presentation could still be due to a mild thrombophlebitis not requiring anticoagulation, versus mild cellulitis, or the pain could be due to claudication.  The patient does have a good pulse on Doppler.  At this time she is stable for discharge home.  I have referred her to vascular surgery.  I will restart her on Keflex and have prescribed tramadol for pain.  Based on her age we will avoid NSAIDs and there is no indication for anticoagulation.  I counseled the patient on the results of the workup and plan of care.  I gave her strict return precautions and she expressed understanding.  FINAL CLINICAL IMPRESSION(S) / ED DIAGNOSES   Final diagnoses:  Left leg pain     Rx / DC Orders   ED Discharge Orders          Ordered    traMADol (ULTRAM) 50 MG tablet  Every 6 hours PRN        02/11/22 0840    cephALEXin (KEFLEX) 500 MG capsule  4 times daily        02/11/22 0840             Note:  This document was prepared using Dragon voice recognition software and may include unintentional dictation errors.    Arta Silence, MD 02/11/22 [phone]

## 2022-02-11 NOTE — Discharge Instructions (Signed)
Your pain and the redness are likely either caused by superficial thrombophlebitis (a small clot in one of the veins close to the skin causing inflammation), cellulitis (infection of the skin), or a circulation problem causing "claudication" which is pain related to decreased circulation.  Follow-up with your primary care and with vascular surgery.  Take the antibiotic as prescribed.  Continue taking Tylenol for pain and you may take the tramadol if needed for more severe pain that is not relieved by Tylenol.  Return to the ER for new, worsening, or persistent severe pain, redness, especially at the rash or redness spreading up the leg, numbness, cool skin, or other color changes to the lower leg, fever or chills, open wounds or lesions, or any other new or worsening symptoms that concern you.

## 2022-02-11 NOTE — ED Notes (Signed)
Pt here for left ankle pain. Ankle appears red but not swollen. Pain is sharp intermittent pain rated 8/10. Started in October.

## 2022-02-21 ENCOUNTER — Other Ambulatory Visit: Payer: Self-pay | Admitting: Physician Assistant

## 2022-02-21 DIAGNOSIS — G8929 Other chronic pain: Secondary | ICD-10-CM

## 2022-02-21 DIAGNOSIS — N959 Unspecified menopausal and perimenopausal disorder: Secondary | ICD-10-CM

## 2022-02-24 NOTE — Telephone Encounter (Signed)
Requested Prescriptions  Pending Prescriptions Disp Refills   meloxicam (MOBIC) 7.5 MG tablet [Pharmacy Med Name: MELOXICAM 7.5 MG TABLET] 90 tablet 1    Sig: TAKE 1 TABLET BY MOUTH EVERY DAY AS NEEDED     Analgesics:  COX2 Inhibitors Failed - 02/21/2022  7:54 PM      Failed - Manual Review: Labs are only required if the patient has taken medication for more than 8 weeks.      Passed - HGB in normal range and within 360 days    Hemoglobin  Date Value Ref Range Status  07/04/2021 14.2 11.1 - 15.9 g/dL Final         Passed - Cr in normal range and within 360 days    Creat  Date Value Ref Range Status  02/18/2017 0.85 0.60 - 0.93 mg/dL Final    Comment:    For patients >68 years of age, the reference limit for Creatinine is approximately 13% higher for people identified as African-American. .    Creatinine, Ser  Date Value Ref Range Status  07/04/2021 0.84 0.57 - 1.00 mg/dL Final         Passed - HCT in normal range and within 360 days    Hematocrit  Date Value Ref Range Status  07/04/2021 42.8 34.0 - 46.6 % Final         Passed - AST in normal range and within 360 days    AST  Date Value Ref Range Status  07/04/2021 25 0 - 40 IU/L Final         Passed - ALT in normal range and within 360 days    ALT  Date Value Ref Range Status  07/04/2021 19 0 - 32 IU/L Final         Passed - eGFR is 30 or above and within 360 days    GFR, Est African American  Date Value Ref Range Status  02/18/2017 78 > OR = 60 mL/min/1.68m Final   GFR calc Af Amer  Date Value Ref Range Status  09/06/2019 >60 >60 mL/min Final   GFR, Est Non African American  Date Value Ref Range Status  02/18/2017 67 > OR = 60 mL/min/1.731mFinal   GFR calc non Af Amer  Date Value Ref Range Status  09/06/2019 56 (L) >60 mL/min Final   eGFR  Date Value Ref Range Status  07/04/2021 71 >59 mL/min/1.73 Final         Passed - Patient is not pregnant      Passed - Valid encounter within last 12  months    Recent Outpatient Visits           4 weeks ago [facility], unspecified type   [facility]   2 months ago [facility] of left calf   [facility]   4 months ago Menopausal and perimenopausal disorder   [facility]   8 months ago Hypothyroidism due to acquired atrophy of thyroid   [facility]   1 year ago Essential (primary) hypertension   [facility]               estradiol (ESTRACE) 0.5 MG tablet [Pharmacy Med Name: ESTRADIOL 0.5 MG TABLET] 90 tablet 1    Sig: TAKE 1 TABLET BY MOUTH EVERY DAY     OB/GYN:  Estrogens Failed - 02/21/2022  7:54 PM  Failed - Mammogram is up-to-date per Health Maintenance      Failed - Last BP in normal range    BP Readings from Last 1 Encounters:  02/11/22 (!) 149/81         Passed - Valid encounter within last 12 months    Recent Outpatient Visits           4 weeks ago Eczema, unspecified type   [facility] Thedore Mins, [place], PA-C   2 months ago Pain of left calf   [facility] [place], [place], PA-C   4 months ago Menopausal and perimenopausal disorder   [facility] Thedore Mins, Ria Comment, PA-C   8 months ago Hypothyroidism due to acquired atrophy of thyroid   [facility] Birdie Sons, MD   1 year ago Essential (primary) hypertension   [facility], Dionne Bucy, MD

## 2022-03-05 ENCOUNTER — Ambulatory Visit
Admission: RE | Admit: 2022-03-05 | Discharge: 2022-03-05 | Disposition: A | Discharge status: Home / Self Care | Payer: PPO | Source: Ambulatory Visit | Attending: Physician Assistant | Admitting: Physician Assistant

## 2022-03-05 DIAGNOSIS — Z1231 Encounter for screening mammogram for malignant neoplasm of breast: Secondary | ICD-10-CM | POA: Diagnosis not present

## 2022-03-06 ENCOUNTER — Other Ambulatory Visit: Payer: Self-pay | Admitting: Physician Assistant

## 2022-03-06 ENCOUNTER — Ambulatory Visit: Payer: Self-pay

## 2022-03-06 DIAGNOSIS — L03116 Cellulitis of left lower limb: Secondary | ICD-10-CM

## 2022-03-06 MED ORDER — TRAMADOL HCL 50 MG PO TABS: 50.0000 mg | ORAL_TABLET | Freq: Four times a day (QID) | ORAL | 0 refills | Status: AC | PRN

## 2022-03-06 NOTE — Telephone Encounter (Signed)
  Chief Complaint: medication request for leg pain Symptoms: leg pain Frequency: ongoing Pertinent Negatives: Patient denies  Disposition: [] ED /[] Urgent Care (no appt availability in office) / [] Appointment(In office/virtual)/ []  [place] [facility]/ [] Home Care/ [] Refused Recommended Disposition /[] [place] [facility]/ [x]  Follow-up with PCP Additional Notes: PT was seen in Ed and given tramadol for her leg pain.  Pt would like a refill of this medication.    Summary: Leg Pain   Patient was in ED on 02/11/22 for leg pain that she was previously seen for by PCP. ED provider gave patient traMADol (ULTRAM) 50 MG tablet. Patient states the medication is helping and wants to know if PCP will write her another prescription.       Reason for Disposition  Prescription request for new medicine (not a refill)  Answer Assessment - Initial Assessment Questions 1. DRUG NAME: "What medicine do you need to have refilled?"     Tramadol 2. REFILLS REMAINING: "How many refills are remaining?" (Note: The label on the medicine or pill bottle will show how many refills are remaining. If there are no refills remaining, then a renewal may be needed.)     none 3. EXPIRATION DATE: "What is the expiration date?" (Note: The label states when the prescription will expire, and thus can no longer be refilled.)      4. PRESCRIBING HCP: "Who prescribed it?" Reason: If prescribed by specialist, call should be referred to that group.     ED 5. SYMPTOMS: "Do you have any symptoms?"     Pain 6. PREGNANCY: "Is there any chance that you are pregnant?" "When was your last menstrual period?"  Protocols used: Medication Refill and Renewal Call-A-AH

## 2022-03-13 ENCOUNTER — Ambulatory Visit (INDEPENDENT_AMBULATORY_CARE_PROVIDER_SITE_OTHER): Payer: PPO | Admitting: Vascular Surgery

## 2022-03-13 ENCOUNTER — Encounter (INDEPENDENT_AMBULATORY_CARE_PROVIDER_SITE_OTHER): Payer: Self-pay | Admitting: Vascular Surgery

## 2022-03-13 VITALS — BP 135/77 | HR 69 | Resp 16 | Wt 140.0 lb

## 2022-03-13 DIAGNOSIS — E7800 Pure hypercholesterolemia, unspecified: Secondary | ICD-10-CM | POA: Diagnosis not present

## 2022-03-13 DIAGNOSIS — M79609 Pain in unspecified limb: Secondary | ICD-10-CM | POA: Insufficient documentation

## 2022-03-13 DIAGNOSIS — M79605 Pain in left leg: Secondary | ICD-10-CM

## 2022-03-13 DIAGNOSIS — R609 Edema, unspecified: Secondary | ICD-10-CM | POA: Diagnosis not present

## 2022-03-13 NOTE — Assessment & Plan Note (Signed)
Patient has pain and swelling in the left lower extremity that is worrisome for venous insufficiency.  This is associated with stasis dermatitis.  We had a long discussion today regarding the pathophysiology and natural history of venous insufficiency.  I recommend she begin wearing 20 to 30 mmHg compression socks daily.  I recommended she try anti-inflammatories as needed for the discomfort.  Recommended she remain active and elevate her legs is much as possible when not active.  We will obtain a venous reflux study in the near future at her convenience.  Will see her back following the study to discuss the results and determine further treatment options. 

## 2022-03-13 NOTE — Patient Instructions (Signed)
Chronic Venous Insufficiency Chronic venous insufficiency is a condition where the leg veins cannot effectively pump blood from the legs to the heart. This happens when the vein walls are either stretched, weakened, or damaged, or when the valves inside the vein are damaged. With the right treatment, you should be able to continue with an active life. This condition is also called venous stasis. What are the causes? Common causes of this condition include: High blood pressure inside the veins (venous hypertension). Sitting or standing too long, causing increased blood pressure in the leg veins. A blood clot that blocks blood flow in a vein (deep vein thrombosis, DVT). Inflammation of a vein (phlebitis) that causes a blood clot to form. Tumors in the pelvis that cause blood to back up. What increases the risk? The following factors may make you more likely to develop this condition: Having a family history of this condition. Obesity. Pregnancy. Living without enough regular physical activity or exercise (sedentary lifestyle). Smoking. Having a job that requires long periods of standing or sitting in one place. Being a certain age. Women in their 21s and 52s and men in their 97s are more likely to develop this condition. What are the signs or symptoms? Symptoms of this condition include: Veins that are enlarged, bulging, or twisted (varicose veins). Skin breakdown or ulcers. Reddened skin or dark discoloration of skin on the leg between the knee and ankle. Brown, smooth, tight, and painful skin just above the ankle, usually on the inside of the leg (lipodermatosclerosis). Swelling of the legs. How is this diagnosed? This condition may be diagnosed based on: Your medical history. A physical exam. Tests, such as: A procedure that creates an image of a blood vessel and nearby organs and provides information about blood flow through the blood vessel (duplex ultrasound). A procedure that  tests blood flow (plethysmography). A procedure that looks at the veins using X-ray and dye (venogram). How is this treated? The goals of treatment are to help you return to an active life and to minimize pain or disability. Treatment depends on the severity of your condition, and it may include: Wearing compression stockings. These can help relieve symptoms and help prevent your condition from getting worse. However, they do not cure the condition. Sclerotherapy. This procedure involves an injection of a solution that shrinks damaged veins. Surgery. This may involve: Removing a diseased vein (vein stripping). Cutting off blood flow through the vein (laser ablation surgery). Repairing or reconstructing a valve within the affected vein. Follow these instructions at home:     Wear compression stockings as told by your health care provider. These stockings help to prevent blood clots and reduce swelling in your legs. Take over-the-counter and prescription medicines only as told by your health care provider. Stay active by exercising, walking, or doing different activities. Ask your health care provider what activities are safe for you and how much exercise you need. Drink enough fluid to keep your urine pale yellow. Do not use any products that contain nicotine or tobacco, such as cigarettes, e-cigarettes, and chewing tobacco. If you need help quitting, ask your health care provider. Keep all follow-up visits as told by your health care provider. This is important. Contact a health care provider if you: Have redness, swelling, or more pain in the affected area. See a red streak or line that goes up or down from the affected area. Have skin breakdown or skin loss in the affected area, even if the breakdown is small. Get  an injury in the affected area. Get help right away if: You get an injury and an open wound in the affected area. You have: Severe pain that does not get better with  medicine. Sudden numbness or weakness in the foot or ankle below the affected area. Trouble moving your foot or ankle. A fever. Worse or persistent symptoms. Chest pain. Shortness of breath. Summary Chronic venous insufficiency is a condition where the leg veins cannot effectively pump blood from the legs to the heart. Chronic venous insufficiency occurs when the vein walls become stretched, weakened, or damaged, or when valves within the vein are damaged. Treatment depends on how severe your condition is. It often involves wearing compression stockings and may involve having a procedure. Make sure you stay active by exercising, walking, or doing different activities. Ask your health care provider what activities are safe for you and how much exercise you need. This information is not intended to replace advice given to you by your health care provider. Make sure you discuss any questions you have with your health care provider. Document Revised: 04/23/2020 Document Reviewed: 04/23/2020 Elsevier Patient Education  2023 [employer]  

## 2022-03-13 NOTE — Progress Notes (Signed)
Patient ID: Jamie [name], female   DOB: [birth year], 82 y.o.   MRN: [id number]  Chief Complaint  Patient presents with   New Patient (Initial Visit)    Ref Jamie [name] consult left le cellulitis and edema    HPI Jamie [name] is a 82 y.o. female.  I am asked to see the patient by Jamie [name] for evaluation of pain, swelling, and discoloration of the left medial lower leg.  The patient's had 2 negative DVT studies but does say that her mother had a history of phlebitis years ago.  She has been treated for cellulitis.  She still has burning and stinging particularly few centimeters above her left medial ankle.  There is swelling in that area.  She tries to elevate her legs.  She has been taking Tylenol because of previous difficulties with anti-inflammatories and this helps but does not alleviate the pain once they wear off the pain returns.  No right leg symptoms.  No history of trauma or injury to the area.  No personal history of DVT or superficial thrombophlebitis to her knowledge.     Past Medical History:  Diagnosis Date   Anemia    Anxiety    Arthritis    GERD (gastroesophageal reflux disease)    Headache    Hypertension    patient denies   Hypothyroidism    Macular degeneration    Ovarian cyst, left 2021   PONV (postoperative nausea and vomiting)     Past Surgical History:  Procedure Laterality Date   ABDOMINAL HYSTERECTOMY  1980   APPENDECTOMY     CATARACT EXTRACTION Bilateral 2014   CHOLECYSTECTOMY     COLONOSCOPY WITH PROPOFOL N/A 03/22/2015   Procedure: COLONOSCOPY WITH PROPOFOL;  Surgeon: Manya Silvas, MD;  Location: [place];  Service: Endoscopy;  Laterality: N/A;   EYE SURGERY     LAPAROSCOPIC BILATERAL SALPINGO OOPHERECTOMY Bilateral 09/11/2019   Procedure: LAPAROSCOPIC BILATERAL SALPINGO OOPHORECTOMY;  Surgeon: Schermerhorn, Gwen Her, MD;  Location: [facility];  Service: Gynecology;  Laterality: Bilateral;   TUBAL LIGATION       Family History   Problem Relation Age of Onset   Hyperlipidemia Mother    Heart attack Father    Emphysema Father    Healthy Sister    Diabetes Brother    Lymphoma Brother    Stroke Maternal Grandmother    Breast cancer Neg Hx   Mother had a history of phlebitis   Social History   Tobacco Use   Smoking status: Never   Smokeless tobacco: Never  Vaping Use   Vaping Use: Never used  Substance Use Topics   Alcohol use: No   Drug use: No    Allergies  Allergen Reactions   Codeine     Stomach pain   Penicillins Rash    Current Outpatient Medications  Medication Sig Dispense Refill   Acetaminophen-Caffeine (TENSION HEADACHE) 500-65 MG TABS Take 2 tablets by mouth daily as needed (headaches).     ALPRAZolam (XANAX) 0.25 MG tablet Take 0.5 tablets (0.125 mg total) by mouth at bedtime as needed for anxiety. 30 tablet 1   b complex vitamins tablet Take 1 tablet by mouth daily.     cetirizine (ZYRTEC) 10 MG tablet Take 10 mg by mouth daily. As needed     Cholecalciferol (VITAMIN D) 50 MCG (2000 UT) tablet Take 2,000 Units by mouth daily.     esomeprazole (NEXIUM) 20 MG capsule Take 20 mg by mouth daily.  estradiol (ESTRACE) 0.5 MG tablet TAKE 1 TABLET BY MOUTH EVERY DAY 90 tablet 1   furosemide (LASIX) 20 MG tablet TAKE 1 TABLET BY MOUTH EVERY DAY 90 tablet 1   meloxicam (MOBIC) 7.5 MG tablet TAKE 1 TABLET BY MOUTH EVERY DAY AS NEEDED 90 tablet 1   MULTIPLE VITAMIN PO Take 1 tablet by mouth daily.      Multiple Vitamins-Minerals (PRESERVISION AREDS 2 PO) Take 1 tablet by mouth 2 (two) times daily.     nortriptyline (PAMELOR) 50 MG capsule TAKE 1 CAPSULE BY MOUTH EVERYDAY AT BEDTIME 90 capsule 1   rosuvastatin (CRESTOR) 5 MG tablet TAKE 1 TABLET (5 MG TOTAL) BY MOUTH DAILY. 90 tablet 1   SYNTHROID 137 MCG tablet TAKE 1 TABLET BY MOUTH EVERY DAY BEFORE BREAKFAST 90 tablet 1   tacrolimus (PROTOPIC) 0.03 % ointment Apply topically 2 (two) times daily. For up to a week 30 g 0   tiZANidine  (ZANAFLEX) 4 MG tablet TAKE 0.5-1 TABLETS (2-4 MG TOTAL) BY MOUTH EVERY 6 (SIX) HOURS AS NEEDED FOR MUSCLE SPASMS. 90 tablet 0   triamcinolone cream (KENALOG) 0.1 % APPLY TO AFFECTED AREA TWICE A DAY 60 g 1   vitamin E 1000 UNIT capsule Take 1,000 Units by mouth daily.     No current facility-administered medications for this visit.      REVIEW OF SYSTEMS (Negative unless checked)  Constitutional: [] Weight loss  [] Fever  [] Chills Cardiac: [] Chest pain   [] Chest pressure   [] Palpitations   [] Shortness of breath when laying flat   [] Shortness of breath at rest   [] Shortness of breath with exertion. Vascular:  [] Pain in legs with walking   [x] Pain in legs at rest   [] Pain in legs when laying flat   [] Claudication   [] Pain in feet when walking  [] Pain in feet at rest  [] Pain in feet when laying flat   [] History of DVT   [] Phlebitis   [x] Swelling in legs   [] Varicose veins   [] Non-healing ulcers Pulmonary:   [] Uses home oxygen   [] Productive cough   [] Hemoptysis   [] Wheeze  [] COPD   [] Asthma Neurologic:  [] Dizziness  [] Blackouts   [] Seizures   [] History of stroke   [] History of TIA  [] Aphasia   [] Temporary blindness   [] Dysphagia   [] Weakness or numbness in arms   [] Weakness or numbness in legs Musculoskeletal:  [x] Arthritis   [] Joint swelling   [] Joint pain   [] Low back pain Hematologic:  [] Easy bruising  [] Easy bleeding   [] Hypercoagulable state   [x] Anemic  [] Hepatitis Gastrointestinal:  [] Blood in stool   [] Vomiting blood  [x] Gastroesophageal reflux/heartburn   [] Abdominal pain Genitourinary:  [] Chronic kidney disease   [] Difficult urination  [] Frequent urination  [] Burning with urination   [] Hematuria Skin:  [] Rashes   [] Ulcers   [] Wounds Psychological:  [x] History of anxiety   []  History of major depression.    Physical Exam BP 135/77 (BP Location: Left Arm)   Pulse 69   Resp 16   Wt 140 lb (63.5 kg)   BMI 23.30 kg/m  Gen:  WD/WN, NAD. Appears younger than stated age. Head: [place]/AT,  No temporalis wasting.  Ear/Nose/Throat: Hearing grossly intact, nares w/o erythema or drainage, oropharynx w/o Erythema/Exudate Eyes: Conjunctiva clear, sclera non-icteric  Neck: trachea midline.  No JVD.  Pulmonary:  Good air movement, respirations not labored, no use of accessory muscles  Cardiac: RRR, no JVD Vascular:  Vessel Right Left  Radial Palpable Palpable  PT palpable palpable  DP palpable palpable   Gastrointestinal:. No masses, surgical incisions, or scars. Musculoskeletal: M/S 5/5 throughout.  Extremities without ischemic changes.  No deformity or atrophy.  Scattered varicosities present.  Moderate stasis dermatitis changes are present in the left medial lower leg.  Trace left lower extremity edema. Neurologic: Sensation grossly intact in extremities.  Symmetrical.  Speech is fluent. Motor exam as listed above. Psychiatric: Judgment intact, Mood & affect appropriate for pt's clinical situation. Dermatologic: No rashes or ulcers noted.  No cellulitis or open wounds.    Radiology MM 3D SCREEN BREAST BILATERAL  Result Date: 03/06/2022 CLINICAL DATA:  Screening. EXAM: DIGITAL SCREENING BILATERAL MAMMOGRAM WITH TOMOSYNTHESIS AND CAD TECHNIQUE: Bilateral screening digital craniocaudal and mediolateral oblique mammograms were obtained. Bilateral screening digital breast tomosynthesis was performed. The images were evaluated with computer-aided detection. COMPARISON:  Previous exam(s). ACR Breast Density Category b: There are scattered areas of fibroglandular density. FINDINGS: There are no findings suspicious for malignancy. IMPRESSION: No mammographic evidence of malignancy. A result letter of this screening mammogram will be mailed directly to the patient. RECOMMENDATION: Screening mammogram in one year. (Code:SM-B-01Y) BI-RADS CATEGORY  1: Negative. Electronically Signed   By: Sande Brothers M.D.   On: 03/06/2022 10:27    Labs No results found for  this or any previous visit (from the past 2160 hour(s)).  Assessment/Plan:  Edema Patient has pain and swelling in the left lower extremity that is worrisome for venous insufficiency.  This is associated with stasis dermatitis.  We had a long discussion today regarding the pathophysiology and natural history of venous insufficiency.  I recommend she begin wearing 20 to 30 mmHg compression socks daily.  I recommended she try anti-inflammatories as needed for the discomfort.  Recommended she remain active and elevate her legs is much as possible when not active.  We will obtain a venous reflux study in the near future at her convenience.  Will see her back following the study to discuss the results and determine further treatment options.  Hypercholesteremia lipid control important in reducing the progression of atherosclerotic disease. Continue statin therapy   Pain in limb Patient has pain and swelling in the left lower extremity that is worrisome for venous insufficiency.  This is associated with stasis dermatitis.  We had a long discussion today regarding the pathophysiology and natural history of venous insufficiency.  I recommend she begin wearing 20 to 30 mmHg compression socks daily.  I recommended she try anti-inflammatories as needed for the discomfort.  Recommended she remain active and elevate her legs is much as possible when not active.  We will obtain a venous reflux study in the near future at her convenience.  Will see her back following the study to discuss the results and determine further treatment options.      Festus Barren 03/13/2022, 12:15 PM   This note was created with Dragon medical transcription system.  Any errors from dictation are unintentional.

## 2022-03-13 NOTE — Assessment & Plan Note (Signed)
lipid control important in reducing the progression of atherosclerotic disease. Continue statin therapy  

## 2022-03-13 NOTE — Assessment & Plan Note (Signed)
Patient has pain and swelling in the left lower extremity that is worrisome for venous insufficiency.  This is associated with stasis dermatitis.  We had a long discussion today regarding the pathophysiology and natural history of venous insufficiency.  I recommend she begin wearing 20 to 30 mmHg compression socks daily.  I recommended she try anti-inflammatories as needed for the discomfort.  Recommended she remain active and elevate her legs is much as possible when not active.  We will obtain a venous reflux study in the near future at her convenience.  Will see her back following the study to discuss the results and determine further treatment options.

## 2022-03-20 DIAGNOSIS — M9903 Segmental and somatic dysfunction of lumbar region: Secondary | ICD-10-CM | POA: Diagnosis not present

## 2022-03-20 DIAGNOSIS — M5432 Sciatica, left side: Secondary | ICD-10-CM | POA: Diagnosis not present

## 2022-03-20 DIAGNOSIS — M9905 Segmental and somatic dysfunction of pelvic region: Secondary | ICD-10-CM | POA: Diagnosis not present

## 2022-03-20 DIAGNOSIS — M545 Low back pain, unspecified: Secondary | ICD-10-CM | POA: Diagnosis not present

## 2022-04-02 DIAGNOSIS — L739 Follicular disorder, unspecified: Secondary | ICD-10-CM | POA: Diagnosis not present

## 2022-04-02 DIAGNOSIS — D485 Neoplasm of uncertain behavior of skin: Secondary | ICD-10-CM | POA: Diagnosis not present

## 2022-04-03 ENCOUNTER — Ambulatory Visit (INDEPENDENT_AMBULATORY_CARE_PROVIDER_SITE_OTHER): Payer: PPO

## 2022-04-03 ENCOUNTER — Encounter (INDEPENDENT_AMBULATORY_CARE_PROVIDER_SITE_OTHER): Payer: Self-pay | Admitting: Nurse Practitioner

## 2022-04-03 ENCOUNTER — Ambulatory Visit (INDEPENDENT_AMBULATORY_CARE_PROVIDER_SITE_OTHER): Payer: PPO | Admitting: Nurse Practitioner

## 2022-04-03 VITALS — BP 146/69 | HR 73 | Resp 16 | Wt 140.6 lb

## 2022-04-03 DIAGNOSIS — I872 Venous insufficiency (chronic) (peripheral): Secondary | ICD-10-CM | POA: Diagnosis not present

## 2022-04-03 DIAGNOSIS — R609 Edema, unspecified: Secondary | ICD-10-CM

## 2022-04-03 DIAGNOSIS — M79605 Pain in left leg: Secondary | ICD-10-CM

## 2022-04-06 ENCOUNTER — Telehealth (INDEPENDENT_AMBULATORY_CARE_PROVIDER_SITE_OTHER): Payer: Self-pay

## 2022-04-06 ENCOUNTER — Encounter (INDEPENDENT_AMBULATORY_CARE_PROVIDER_SITE_OTHER): Payer: Self-pay | Admitting: Nurse Practitioner

## 2022-04-06 NOTE — Progress Notes (Signed)
Subjective:    Patient ID: Jamie [name], female    DOB: Dec 07, 1940, 82 y.o.   MRN: [id number] Chief Complaint  Patient presents with   Follow-up    Ultrasound follow up    Jamie [name] is a 82 y.o. female.  I am asked to see the patient by Mikey Kirschner for evaluation of pain, swelling, and discoloration of the left medial lower leg.  The patient's had 2 negative DVT studies but does say that her mother had a history of phlebitis years ago.  She has been treated for cellulitis.  She still has burning and stinging particularly few centimeters above her left medial ankle.  There is swelling in that area.  She tries to elevate her legs.  She has been taking Tylenol because of previous difficulties with anti-inflammatories and this helps but does not alleviate the pain once they wear off the pain returns.  She notes that the pain is better when she is standing.  She has tried medical grade compression and is not helped significantly.  She notes that her pain feels worse when she is in the bed or sitting.  She also has a history of back pain as well as sciatica in that left lower extremity as well.  No right leg symptoms.  No history of trauma or injury to the area.  No personal history of DVT or superficial thrombophlebitis to her knowledge.    Today noninvasive study showed no evidence of deep vein thrombosis.  There is extensive deep venous insufficiency with just a very small area of superficial venous reflux.      Review of Systems  Cardiovascular:  Positive for leg swelling.  Musculoskeletal:  Positive for arthralgias and back pain.  All other systems reviewed and are negative.      Objective:   Physical Exam Vitals reviewed.  HENT:     Head: Normocephalic.  Cardiovascular:     Rate and Rhythm: Normal rate.     Pulses: Normal pulses.  Pulmonary:     Effort: Pulmonary effort is normal.  Musculoskeletal:     Left lower leg: Edema present.  Skin:    General: Skin is warm  and dry.  Neurological:     Mental Status: She is alert and oriented to person, place, and time.  Psychiatric:        Mood and Affect: Mood normal.        Behavior: Behavior normal.        Thought Content: Thought content normal.        Judgment: Judgment normal.     BP (!) 146/69 (BP Location: Left Arm)   Pulse 73   Resp 16   Wt 140 lb 9.6 oz (63.8 kg)   BMI 23.40 kg/m   Past Medical History:  Diagnosis Date   Anemia    Anxiety    Arthritis    GERD (gastroesophageal reflux disease)    Headache    Hypertension    patient denies   Hypothyroidism    Macular degeneration    Ovarian cyst, left 2021   PONV (postoperative nausea and vomiting)     Social History   Socioeconomic History   Marital status: Married    Spouse name: Not on file   Number of children: 2   Years of education: Not on file   Highest education level: High school graduate  Occupational History   Occupation: retired  Tobacco Use   Smoking status: Never   Smokeless tobacco:  Never  Vaping Use   Vaping Use: Never used  Substance and Sexual Activity   Alcohol use: No   Drug use: No   Sexual activity: Not on file  Other Topics Concern   Not on file  Social History Narrative   Not on file   Social Determinants of Health   Financial Resource Strain: Low Risk  (09/27/2019)   Overall Financial Resource Strain (CARDIA)    Difficulty of Paying Living Expenses: Not hard at all  Food Insecurity: No Food Insecurity (09/27/2019)   Hunger Vital Sign    Worried About Running Out of Food in the Last Year: Never true    [place] in the Last Year: Never true  Transportation Needs: No Transportation Needs (09/27/2019)   PRAPARE - Hydrologist (Medical): No    Lack of Transportation (Non-Medical): No  Physical Activity: Sufficiently Active (09/27/2019)   Exercise Vital Sign    Days of Exercise per Week: 6 days    Minutes of Exercise per Session: 30 min  Stress: No Stress  Concern Present (09/27/2019)   [place]    Feeling of Stress : Not at all  Social Connections: Moderately Integrated (09/27/2019)   Social Connection and Isolation Panel [NHANES]    Frequency of Communication with Friends and Family: Three times a week    Frequency of Social Gatherings with Friends and Family: Three times a week    Attends Religious Services: More than 4 times per year    Active Member of Clubs or Organizations: No    Attends Archivist Meetings: Never    Marital Status: Married  Human resources officer Violence: Not At Risk (09/27/2019)   Humiliation, Afraid, Rape, and Kick questionnaire    Fear of Current or Ex-Partner: No    Emotionally Abused: No    Physically Abused: No    Sexually Abused: No    Past Surgical History:  Procedure Laterality Date   ABDOMINAL HYSTERECTOMY  1980   APPENDECTOMY     CATARACT EXTRACTION Bilateral 2014   CHOLECYSTECTOMY     COLONOSCOPY WITH PROPOFOL N/A 03/22/2015   Procedure: COLONOSCOPY WITH PROPOFOL;  Surgeon: Manya Silvas, MD;  Location: [facility];  Service: Endoscopy;  Laterality: N/A;   EYE SURGERY     LAPAROSCOPIC BILATERAL SALPINGO OOPHERECTOMY Bilateral 09/11/2019   Procedure: LAPAROSCOPIC BILATERAL SALPINGO OOPHORECTOMY;  Surgeon: Schermerhorn, Gwen Her, MD;  Location: [facility];  Service: Gynecology;  Laterality: Bilateral;   TUBAL LIGATION      Family History  Problem Relation Age of Onset   Hyperlipidemia Mother    Heart attack Father    Emphysema Father    Healthy Sister    Diabetes Brother    Lymphoma Brother    Stroke Maternal Grandmother    Breast cancer Neg Hx     Allergies  Allergen Reactions   Codeine     Stomach pain   Penicillins Rash       Latest Ref Rng & Units 07/04/2021    8:45 AM 05/28/2020    8:50 AM 09/06/2019    9:34 AM  CBC  WBC 3.4 - 10.8 x10E3/uL 7.4  6.8  7.4   Hemoglobin 11.1 - 15.9 g/dL 14.2  14.6  13.3    Hematocrit 34.0 - 46.6 % 42.8  43.8  40.0   Platelets 150 - 450 x10E3/uL 279  297  289       CMP  Component Value Date/Time   NA 143 07/04/2021 0845   K 4.5 07/04/2021 0845   CL 103 07/04/2021 0845   CO2 24 07/04/2021 0845   GLUCOSE 98 07/04/2021 0845   GLUCOSE 104 (H) 09/06/2019 0934   BUN 16 07/04/2021 0845   CREATININE 0.84 07/04/2021 0845   CREATININE 0.85 02/18/2017 0818   CALCIUM 9.4 07/04/2021 0845   PROT 6.7 07/04/2021 0845   ALBUMIN 4.4 07/04/2021 0845   AST 25 07/04/2021 0845   ALT 19 07/04/2021 0845   ALKPHOS 55 07/04/2021 0845   BILITOT 0.3 07/04/2021 0845   GFRNONAA 56 (L) 09/06/2019 0934   GFRNONAA 67 02/18/2017 0818   GFRAA >60 09/06/2019 0934   GFRAA 78 02/18/2017 0818     No results found.     Assessment & Plan:   1. Edema, unspecified type Given the patient's studies her deep venous insufficiency will result in some lower extremity edema.  However even with lower extremity edema I do not believe that this is a significant cause of her pain.  Patient is advised that to help with edema the use of medical grade compression, elevation and activity will be helpful. - VAS [place] LOWER EXTREMITY VENOUS REFLUX  2. Pain of left lower extremity Based on the patient's description of pain as well as the aggravating and relieving factors, I suspect that the pain is actually more so related to her lower back issues versus anything vascular in nature.  Based on this we will have the patient follow-up with orthopedic surgery to evaluate. - VAS [place] LOWER EXTREMITY VENOUS REFLUX - Ambulatory referral to Orthopedic Surgery  3. Venous (peripheral) insufficiency The patient does have a very small amount of superficial venous reflux which can be treated with an endovenous laser ablation.  However her symptoms are not necessarily consistent with what is seen for varicose veins/venous insufficiency.  The patient will move forward with workup as noted above and if this is  unrevealing we can revisit the idea of an endovenous laser ablation.  Otherwise patient will continue with conservative therapy as listed above, and will follow-up with [place] as needed.    Current Outpatient Medications on File Prior to Visit  Medication Sig Dispense Refill   Acetaminophen-Caffeine (TENSION HEADACHE) 500-65 MG TABS Take 2 tablets by mouth daily as needed (headaches).     ALPRAZolam (XANAX) 0.25 MG tablet Take 0.5 tablets (0.125 mg total) by mouth at bedtime as needed for anxiety. 30 tablet 1   b complex vitamins tablet Take 1 tablet by mouth daily.     cetirizine (ZYRTEC) 10 MG tablet Take 10 mg by mouth daily. As needed     Cholecalciferol (VITAMIN D) 50 MCG (2000 UT) tablet Take 2,000 Units by mouth daily.     esomeprazole (NEXIUM) 20 MG capsule Take 20 mg by mouth daily.     estradiol (ESTRACE) 0.5 MG tablet TAKE 1 TABLET BY MOUTH EVERY DAY 90 tablet 1   furosemide (LASIX) 20 MG tablet TAKE 1 TABLET BY MOUTH EVERY DAY 90 tablet 1   meloxicam (MOBIC) 7.5 MG tablet TAKE 1 TABLET BY MOUTH EVERY DAY AS NEEDED 90 tablet 1   MULTIPLE VITAMIN PO Take 1 tablet by mouth daily.      Multiple Vitamins-Minerals (PRESERVISION AREDS 2 PO) Take 1 tablet by mouth 2 (two) times daily.     nortriptyline (PAMELOR) 50 MG capsule TAKE 1 CAPSULE BY MOUTH EVERYDAY AT BEDTIME 90 capsule 1   rosuvastatin (CRESTOR) 5 MG tablet TAKE  1 TABLET (5 MG TOTAL) BY MOUTH DAILY. 90 tablet 1   SYNTHROID 137 MCG tablet TAKE 1 TABLET BY MOUTH EVERY DAY BEFORE BREAKFAST 90 tablet 1   tacrolimus (PROTOPIC) 0.03 % ointment Apply topically 2 (two) times daily. For up to a week 30 g 0   triamcinolone cream (KENALOG) 0.1 % APPLY TO AFFECTED AREA TWICE A DAY 60 g 1   vitamin E 1000 UNIT capsule Take 1,000 Units by mouth daily.     tiZANidine (ZANAFLEX) 4 MG tablet TAKE 0.5-1 TABLETS (2-4 MG TOTAL) BY MOUTH EVERY 6 (SIX) HOURS AS NEEDED FOR MUSCLE SPASMS. 90 tablet 0   No current facility-administered medications on file  prior to visit.    There are no Patient Instructions on file for this visit. No follow-ups on file.   Kris Hartmann, NP

## 2022-04-06 NOTE — Telephone Encounter (Signed)
Referral placed.

## 2022-04-08 DIAGNOSIS — H43823 Vitreomacular adhesion, bilateral: Secondary | ICD-10-CM | POA: Diagnosis not present

## 2022-04-08 DIAGNOSIS — T50995A Adverse effect of other drugs, medicaments and biological substances, initial encounter: Secondary | ICD-10-CM | POA: Diagnosis not present

## 2022-04-08 DIAGNOSIS — H353231 Exudative age-related macular degeneration, bilateral, with active choroidal neovascularization: Secondary | ICD-10-CM | POA: Diagnosis not present

## 2022-04-08 DIAGNOSIS — L270 Generalized skin eruption due to drugs and medicaments taken internally: Secondary | ICD-10-CM | POA: Diagnosis not present

## 2022-04-23 DIAGNOSIS — H902 Conductive hearing loss, unspecified: Secondary | ICD-10-CM | POA: Diagnosis not present

## 2022-04-23 DIAGNOSIS — H6123 Impacted cerumen, bilateral: Secondary | ICD-10-CM | POA: Diagnosis not present

## 2022-05-06 DIAGNOSIS — M545 Low back pain, unspecified: Secondary | ICD-10-CM | POA: Diagnosis not present

## 2022-05-06 DIAGNOSIS — M9903 Segmental and somatic dysfunction of lumbar region: Secondary | ICD-10-CM | POA: Diagnosis not present

## 2022-05-06 DIAGNOSIS — M5432 Sciatica, left side: Secondary | ICD-10-CM | POA: Diagnosis not present

## 2022-05-06 DIAGNOSIS — M9905 Segmental and somatic dysfunction of pelvic region: Secondary | ICD-10-CM | POA: Diagnosis not present

## 2022-05-20 NOTE — Progress Notes (Unsigned)
I,J'ya E Deshundra Waller,acting as a scribe for [employer], PA-C.,have documented all relevant documentation on the behalf of Mikey Kirschner, PA-C,as directed by  Mikey Kirschner, PA-C while in the presence of Mikey Kirschner, PA-C.   Complete physical exam   Patient: Jamie [name]   DOB: [birth year]   82 y.o. Female  MRN: [id number] Visit Date: 05/21/2022  Today's healthcare provider: Mikey Kirschner, PA-C   No chief complaint on file.  Subjective    Jamie [name] is a 82 y.o. female who presents today for a complete physical exam.  She reports consuming a {diet types:17450} diet. {Exercise:19826} She generally feels {well/fairly well/poorly:18703}. She reports sleeping {well/fairly well/poorly:18703}. She {does/does not:200015} have additional problems to discuss today.  HPI  ***  Past Medical History:  Diagnosis Date   Anemia    Anxiety    Arthritis    GERD (gastroesophageal reflux disease)    Headache    Hypertension    patient denies   Hypothyroidism    Macular degeneration    Ovarian cyst, left 2021   PONV (postoperative nausea and vomiting)    Past Surgical History:  Procedure Laterality Date   ABDOMINAL HYSTERECTOMY  1980   APPENDECTOMY     CATARACT EXTRACTION Bilateral 2014   CHOLECYSTECTOMY     COLONOSCOPY WITH PROPOFOL N/A 03/22/2015   Procedure: COLONOSCOPY WITH PROPOFOL;  Surgeon: Manya Silvas, MD;  Location: [facility];  Service: Endoscopy;  Laterality: N/A;   EYE SURGERY     LAPAROSCOPIC BILATERAL SALPINGO OOPHERECTOMY Bilateral 09/11/2019   Procedure: LAPAROSCOPIC BILATERAL SALPINGO OOPHORECTOMY;  Surgeon: Schermerhorn, Gwen Her, MD;  Location: [facility];  Service: Gynecology;  Laterality: Bilateral;   TUBAL LIGATION     Social History   Socioeconomic History   Marital status: Married    Spouse name: Not on file   Number of children: 2   Years of education: Not on file   Highest education level: High school graduate  Occupational History    Occupation: retired  Tobacco Use   Smoking status: Never   Smokeless tobacco: Never  Vaping Use   Vaping Use: Never used  Substance and Sexual Activity   Alcohol use: No   Drug use: No   Sexual activity: Not on file  Other Topics Concern   Not on file  Social History Narrative   Not on file   Social Determinants of Health   Financial Resource Strain: Low Risk  (09/27/2019)   Overall Financial Resource Strain (CARDIA)    Difficulty of Paying Living Expenses: Not hard at all  Food Insecurity: No Food Insecurity (09/27/2019)   Hunger Vital Sign    Worried About Running Out of Food in the Last Year: Never true    [place] in the Last Year: Never true  Transportation Needs: No Transportation Needs (09/27/2019)   PRAPARE - Hydrologist (Medical): No    Lack of Transportation (Non-Medical): No  Physical Activity: Sufficiently Active (09/27/2019)   Exercise Vital Sign    Days of Exercise per Week: 6 days    Minutes of Exercise per Session: 30 min  Stress: No Stress Concern Present (09/27/2019)   [place]    Feeling of Stress : Not at all  Social Connections: Moderately Integrated (09/27/2019)   Social Connection and Isolation Panel [NHANES]    Frequency of Communication with Friends and Family: Three times a week    Frequency  of Social Gatherings with Friends and Family: Three times a week    Attends Religious Services: More than 4 times per year    Active Member of Clubs or Organizations: No    Attends Archivist Meetings: Never    Marital Status: Married  Human resources officer Violence: Not At Risk (09/27/2019)   Humiliation, Afraid, Rape, and Kick questionnaire    Fear of Current or Ex-Partner: No    Emotionally Abused: No    Physically Abused: No    Sexually Abused: No   Family Status  Relation Name Status   Mother  Deceased at age 1       old age   Father  Deceased at age  9       MI   Sister  72   Brother  Alive   MGM  (Not Specified)   Neg Hx  (Not Specified)   Family History  Problem Relation Age of Onset   Hyperlipidemia Mother    Heart attack Father    Emphysema Father    Healthy Sister    Diabetes Brother    Lymphoma Brother    Stroke Maternal Grandmother    Breast cancer Neg Hx    Allergies  Allergen Reactions   Codeine     Stomach pain   Penicillins Rash    Patient Care Team: Mikey Kirschner, PA-C as PCP - General (Physician Assistant) Pa, [place] [facility]   Medications: Outpatient Medications Prior to Visit  Medication Sig   Acetaminophen-Caffeine (TENSION HEADACHE) 500-65 MG TABS Take 2 tablets by mouth daily as needed (headaches).   ALPRAZolam (XANAX) 0.25 MG tablet Take 0.5 tablets (0.125 mg total) by mouth at bedtime as needed for anxiety.   b complex vitamins tablet Take 1 tablet by mouth daily.   cetirizine (ZYRTEC) 10 MG tablet Take 10 mg by mouth daily. As needed   Cholecalciferol (VITAMIN D) 50 MCG (2000 UT) tablet Take 2,000 Units by mouth daily.   esomeprazole (NEXIUM) 20 MG capsule Take 20 mg by mouth daily.   estradiol (ESTRACE) 0.5 MG tablet TAKE 1 TABLET BY MOUTH EVERY DAY   furosemide (LASIX) 20 MG tablet TAKE 1 TABLET BY MOUTH EVERY DAY   meloxicam (MOBIC) 7.5 MG tablet TAKE 1 TABLET BY MOUTH EVERY DAY AS NEEDED   MULTIPLE VITAMIN PO Take 1 tablet by mouth daily.    Multiple Vitamins-Minerals (PRESERVISION AREDS 2 PO) Take 1 tablet by mouth 2 (two) times daily.   nortriptyline (PAMELOR) 50 MG capsule TAKE 1 CAPSULE BY MOUTH EVERYDAY AT BEDTIME   rosuvastatin (CRESTOR) 5 MG tablet TAKE 1 TABLET (5 MG TOTAL) BY MOUTH DAILY.   SYNTHROID 137 MCG tablet TAKE 1 TABLET BY MOUTH EVERY DAY BEFORE BREAKFAST   tacrolimus (PROTOPIC) 0.03 % ointment Apply topically 2 (two) times daily. For up to a week   tiZANidine (ZANAFLEX) 4 MG tablet TAKE 0.5-1 TABLETS (2-4 MG TOTAL) BY MOUTH EVERY 6 (SIX) HOURS AS NEEDED FOR  MUSCLE SPASMS.   triamcinolone cream (KENALOG) 0.1 % APPLY TO AFFECTED AREA TWICE A DAY   vitamin E 1000 UNIT capsule Take 1,000 Units by mouth daily.   No facility-administered medications prior to visit.    Review of Systems  {Labs  Heme  Chem  Endocrine  Serology  Results Review (optional):23779}  Objective    There were no vitals taken for this visit. {Show previous vital signs (optional):23777}   Physical Exam  ***  Last depression screening scores  12/25/2021    1:26 PM 06/23/2021    2:44 PM 12/24/2020   10:11 AM  PHQ 2/9 Scores  PHQ - 2 Score 0 0 0  PHQ- 9 Score 0 0    Last fall risk screening    12/25/2021    1:25 PM  Fall Risk   Falls in the past year? 0  Number falls in past yr: 0  Injury with Fall? 0  Risk for fall due to : No Fall Risks  Follow up Falls evaluation completed   Last Audit-C alcohol use screening    12/25/2021    1:25 PM  Alcohol Use Disorder Test (AUDIT)  1. How often do you have a drink containing alcohol? 0  2. How many drinks containing alcohol do you have on a typical day when you are drinking? 0  3. How often do you have six or more drinks on one occasion? 0  AUDIT-C Score 0   A score of 3 or more in women, and 4 or more in men indicates increased risk for alcohol abuse, EXCEPT if all of the points are from question 1   No results found for any visits on 05/21/22.  Assessment & Plan    Routine Health Maintenance and Physical Exam  Exercise Activities and Dietary recommendations  Goals      DIET - INCREASE WATER INTAKE     Recommend increasing water intake to 6-8 glasses a day.          Immunization History  Administered Date(s) Administered   Fluad Quad(high Dose 65+) 12/24/2020   Influenza, High Dose Seasonal PF 01/03/2015, 12/12/2015, 12/03/2016, 12/15/2017   Influenza-Unspecified 10/24/2013, 11/11/2018, 12/09/2021   PFIZER(Purple Top)SARS-COV-2 Vaccination 04/26/2019, 05/17/2019   Pneumococcal Conjugate-13  01/10/2014   Pneumococcal Polysaccharide-23 05/15/2010    Health Maintenance  Topic Date Due   DTaP/Tdap/Td (1 - Tdap) Never done   Zoster Vaccines- Shingrix (1 of 2) Never done   Medicare Annual Wellness (AWV)  09/26/2020   COVID-19 Vaccine (3 - 2023-24 season) 10/24/2021   DEXA SCAN  03/11/2022   Pneumonia Vaccine 23+ Years old  Completed   INFLUENZA VACCINE  Completed   HPV VACCINES  Aged Out    Discussed health benefits of physical activity, and encouraged her to engage in regular exercise appropriate for her age and condition.  ***  No follow-ups on file.     {provider attestation***:1}   Mikey Kirschner, PA-C  [place] [phone] (phone) [phone] (fax)  [place]

## 2022-05-21 ENCOUNTER — Encounter: Payer: Self-pay | Admitting: Physician Assistant

## 2022-05-21 ENCOUNTER — Ambulatory Visit (INDEPENDENT_AMBULATORY_CARE_PROVIDER_SITE_OTHER): Payer: PPO | Admitting: Physician Assistant

## 2022-05-21 VITALS — BP 134/50 | HR 78 | Temp 97.6°F | Resp 12 | Ht 64.0 in | Wt 135.4 lb

## 2022-05-21 DIAGNOSIS — M8580 Other specified disorders of bone density and structure, unspecified site: Secondary | ICD-10-CM

## 2022-05-21 DIAGNOSIS — E034 Atrophy of thyroid (acquired): Secondary | ICD-10-CM

## 2022-05-21 DIAGNOSIS — E7800 Pure hypercholesterolemia, unspecified: Secondary | ICD-10-CM

## 2022-05-21 DIAGNOSIS — Z0000 Encounter for general adult medical examination without abnormal findings: Secondary | ICD-10-CM

## 2022-05-21 DIAGNOSIS — Z79890 Hormone replacement therapy (postmenopausal): Secondary | ICD-10-CM

## 2022-05-21 NOTE — Progress Notes (Signed)
I,J'ya E Hunter,acting as a scribe for [employer], PA-C.,have documented all relevant documentation on the behalf of Mikey Kirschner, PA-C,as directed by  Mikey Kirschner, PA-C while in the presence of Mikey Kirschner, PA-C.   Annual Wellness Visit     Patient: Jamie [name], Female    DOB: [birth year], 82 y.o.   MRN: [id number] Visit Date: 05/21/2022  Today's Provider: Mikey Kirschner, PA-C   Chief Complaint  Patient presents with   Annual Exam    AWV   Subjective    MILLISSA MURNANE is a 82 y.o. female who presents today for her Annual Wellness Visit. She reports consuming a low fat diet. Home exercise routine includes walking .5 hrs per days. She generally feels well. She reports sleeping well. She does not have additional problems to discuss today.   HPI  Pt reports going cold [place] on her estrogen medication and since then has felt significant side effects including severe hot flashes.  Medications: Outpatient Medications Prior to Visit  Medication Sig   Acetaminophen-Caffeine (TENSION HEADACHE) 500-65 MG TABS Take 2 tablets by mouth daily as needed (headaches).   ALPRAZolam (XANAX) 0.25 MG tablet Take 0.5 tablets (0.125 mg total) by mouth at bedtime as needed for anxiety.   b complex vitamins tablet Take 1 tablet by mouth daily.   cetirizine (ZYRTEC) 10 MG tablet Take 10 mg by mouth daily. As needed   Cholecalciferol (VITAMIN D) 50 MCG (2000 UT) tablet Take 2,000 Units by mouth daily.   esomeprazole (NEXIUM) 20 MG capsule Take 20 mg by mouth daily.   furosemide (LASIX) 20 MG tablet TAKE 1 TABLET BY MOUTH EVERY DAY   meloxicam (MOBIC) 7.5 MG tablet TAKE 1 TABLET BY MOUTH EVERY DAY AS NEEDED   MULTIPLE VITAMIN PO Take 1 tablet by mouth daily.    Multiple Vitamins-Minerals (PRESERVISION AREDS 2 PO) Take 1 tablet by mouth 2 (two) times daily.   nortriptyline (PAMELOR) 50 MG capsule TAKE 1 CAPSULE BY MOUTH EVERYDAY AT BEDTIME   rosuvastatin (CRESTOR) 5 MG tablet TAKE 1  TABLET (5 MG TOTAL) BY MOUTH DAILY.   SYNTHROID 137 MCG tablet TAKE 1 TABLET BY MOUTH EVERY DAY BEFORE BREAKFAST   tiZANidine (ZANAFLEX) 4 MG tablet TAKE 0.5-1 TABLETS (2-4 MG TOTAL) BY MOUTH EVERY 6 (SIX) HOURS AS NEEDED FOR MUSCLE SPASMS.   vitamin E 1000 UNIT capsule Take 1,000 Units by mouth daily.   [DISCONTINUED] estradiol (ESTRACE) 0.5 MG tablet TAKE 1 TABLET BY MOUTH EVERY DAY   [DISCONTINUED] tacrolimus (PROTOPIC) 0.03 % ointment Apply topically 2 (two) times daily. For up to a week   [DISCONTINUED] triamcinolone cream (KENALOG) 0.1 % APPLY TO AFFECTED AREA TWICE A DAY (Patient not taking: Reported on 05/21/2022)   No facility-administered medications prior to visit.    Allergies  Allergen Reactions   Codeine     Stomach pain   Faricimab Itching   Penicillins Rash    Patient Care Team: Mikey Kirschner, PA-C as PCP - General (Physician Assistant) Pa, [facility]  Review of Systems  Constitutional:  Positive for diaphoresis. Negative for fatigue and fever.  Respiratory:  Negative for cough and shortness of breath.   Cardiovascular:  Negative for chest pain and leg swelling.  Gastrointestinal:  Negative for abdominal pain.  Neurological:  Negative for dizziness and headaches.      Objective    Vitals: BP (!) 134/50 (BP Location: Right Arm, Patient Position: Sitting, Cuff Size: Normal)   Pulse 78  Temp 97.6 F (36.4 C) (Oral)   Resp 12   Ht 5\' 4"  (1.626 m)   Wt 135 lb 6.4 oz (61.4 kg)   SpO2 100%   BMI 23.24 kg/m     Physical Exam Constitutional:      General: She is awake.     Appearance: She is well-developed. She is not ill-appearing.  HENT:     Head: Normocephalic.     Right Ear: Tympanic membrane normal.     Left Ear: Tympanic membrane normal.     Nose: Nose normal. No congestion or rhinorrhea.     Mouth/Throat:     Pharynx: No oropharyngeal exudate or posterior oropharyngeal erythema.  Eyes:     Conjunctiva/sclera: Conjunctivae normal.      Pupils: Pupils are equal, round, and reactive to light.  Neck:     Thyroid: No thyroid mass or thyromegaly.  Cardiovascular:     Rate and Rhythm: Normal rate and regular rhythm.     Heart sounds: Murmur heard.  Pulmonary:     Effort: Pulmonary effort is normal.     Breath sounds: Normal breath sounds.  Abdominal:     Palpations: Abdomen is soft.     Tenderness: There is no abdominal tenderness.  Musculoskeletal:     Right lower leg: No swelling. No edema.     Left lower leg: No swelling. No edema.  Lymphadenopathy:     Cervical: No cervical adenopathy.  Skin:    General: Skin is warm.  Neurological:     Mental Status: She is alert and oriented to person, place, and time.  Psychiatric:        Attention and Perception: Attention normal.        Mood and Affect: Mood normal.        Speech: Speech normal.        Behavior: Behavior normal. Behavior is cooperative.    Most recent functional status assessment:    05/21/2022    2:43 PM  In your present state of health, do you have any difficulty performing the following activities:  Hearing? 0  Vision? 0  Difficulty concentrating or making decisions? 0  Walking or climbing stairs? 0  Dressing or bathing? 0  Doing errands, shopping? 0   Most recent fall risk assessment:    05/21/2022    2:43 PM  [place] in the past year? 0  Number falls in past yr: 0  Risk for fall due to : No Fall Risks    Most recent depression screenings:    05/21/2022    2:43 PM 12/25/2021    1:26 PM  PHQ 2/9 Scores  PHQ - 2 Score 0 0  PHQ- 9 Score 0 0   Most recent cognitive screening:     No data to display         Most recent Audit-C alcohol use screening    05/21/2022    2:44 PM  Alcohol Use Disorder Test (AUDIT)  1. How often do you have a drink containing alcohol? 0   A score of 3 or more in women, and 4 or more in men indicates increased risk for alcohol abuse, EXCEPT if all of the points are from question 1   No  results found for any visits on 05/21/22.  Assessment & Plan     Annual wellness visit done today including the all of the following: Reviewed patient's Family Medical History Reviewed and updated list of patient's medical providers Assessment  of cognitive impairment was done Assessed patient's functional ability Established a written schedule for health screening [place] Completed and Reviewed  Exercise Activities and Dietary recommendations --balanced diet high in fiber and protein, low in sugars, carbs, fats. --physical activity/exercise 30 minutes 3-5 times a week    Immunization History  Administered Date(s) Administered   Fluad Quad(high Dose 65+) 12/24/2020   Influenza, High Dose Seasonal PF 01/03/2015, 12/12/2015, 12/03/2016, 12/15/2017   Influenza-Unspecified 10/24/2013, 11/11/2018, 12/09/2021   PFIZER(Purple Top)SARS-COV-2 Vaccination 04/26/2019, 05/17/2019   Pneumococcal Conjugate-13 01/10/2014   Pneumococcal Polysaccharide-23 05/15/2010    Health Maintenance  Topic Date Due   DTaP/Tdap/Td (1 - Tdap) Never done   Zoster Vaccines- Shingrix (1 of 2) Never done   COVID-19 Vaccine (3 - 2023-24 season) 10/24/2021   DEXA SCAN  03/11/2022   Medicare Annual Wellness (AWV)  05/21/2023   Pneumonia Vaccine 71+ Years old  Completed   INFLUENZA VACCINE  Completed   HPV VACCINES  Aged Out     Discussed health benefits of physical activity, and encouraged her to engage in regular exercise appropriate for her age and condition.    Problem List Items Addressed This Visit       Endocrine   Hypothyroidism    Well controlled  Continue medication Repeat tsh/t4      Relevant Orders   TSH + free T4     Musculoskeletal and Integument   Osteopenia    Last dexa 2018. Advised pt to repeat for progression Continue with vit d, calcium, exercise      Relevant Orders   DG Bone Density     Other   Hypercholesteremia    Repeat fasting lipids for  stability  Managed on crestor 5 mg      Relevant Orders   Lipid panel   Hormone replacement therapy    Pt managed on estradiol for years. She d/c cold [place] 2 weeks ago, significant hot flashes  Advised slow titration off, 1/2 tab, every other day, etc Advised we have other options for hot flashes if a slow titration is not helpfuly ie ssris      Other Visit Diagnoses     Encounter for annual wellness exam in Medicare patient    -  Primary   Annual physical exam       Relevant Orders   CBC w/Diff/Platelet   Comprehensive Metabolic Panel (CMET)       Return in about 6 months (around 11/21/2022) for chronic conditions.     I, Mikey Kirschner, PA-C have reviewed all documentation for this visit. The documentation on  05/22/22 for the exam, diagnosis, procedures, and orders are all accurate and complete.  Mikey Kirschner, PA-C [facility] [address] [place], [place], [address] Office: [phone] Fax: [place]

## 2022-05-22 ENCOUNTER — Encounter: Payer: Self-pay | Admitting: Physician Assistant

## 2022-05-22 DIAGNOSIS — Z79890 Hormone replacement therapy (postmenopausal): Secondary | ICD-10-CM | POA: Insufficient documentation

## 2022-05-22 DIAGNOSIS — M8580 Other specified disorders of bone density and structure, unspecified site: Secondary | ICD-10-CM | POA: Insufficient documentation

## 2022-05-22 NOTE — Assessment & Plan Note (Signed)
Last dexa 2018. Advised pt to repeat for progression Continue with vit d, calcium, exercise

## 2022-05-22 NOTE — Assessment & Plan Note (Signed)
Well controlled  Continue medication Repeat tsh/t4

## 2022-05-22 NOTE — Assessment & Plan Note (Signed)
Repeat fasting lipids for stability  Managed on crestor 5 mg

## 2022-05-22 NOTE — Assessment & Plan Note (Signed)
Pt managed on estradiol for years. She d/c cold [place] 2 weeks ago, significant hot flashes  Advised slow titration off, 1/2 tab, every other day, etc Advised we have other options for hot flashes if a slow titration is not helpfuly ie ssris

## 2022-06-02 DIAGNOSIS — M545 Low back pain, unspecified: Secondary | ICD-10-CM | POA: Diagnosis not present

## 2022-06-02 DIAGNOSIS — M9903 Segmental and somatic dysfunction of lumbar region: Secondary | ICD-10-CM | POA: Diagnosis not present

## 2022-06-02 DIAGNOSIS — M5432 Sciatica, left side: Secondary | ICD-10-CM | POA: Diagnosis not present

## 2022-06-02 DIAGNOSIS — M9905 Segmental and somatic dysfunction of pelvic region: Secondary | ICD-10-CM | POA: Diagnosis not present

## 2022-06-17 ENCOUNTER — Other Ambulatory Visit: Payer: Self-pay | Admitting: Physician Assistant

## 2022-06-17 DIAGNOSIS — L309 Dermatitis, unspecified: Secondary | ICD-10-CM

## 2022-06-17 DIAGNOSIS — E034 Atrophy of thyroid (acquired): Secondary | ICD-10-CM

## 2022-06-17 DIAGNOSIS — F411 Generalized anxiety disorder: Secondary | ICD-10-CM

## 2022-06-18 DIAGNOSIS — L538 Other specified erythematous conditions: Secondary | ICD-10-CM | POA: Diagnosis not present

## 2022-06-18 DIAGNOSIS — D0439 Carcinoma in situ of skin of other parts of face: Secondary | ICD-10-CM | POA: Diagnosis not present

## 2022-06-18 DIAGNOSIS — L820 Inflamed seborrheic keratosis: Secondary | ICD-10-CM | POA: Diagnosis not present

## 2022-06-18 DIAGNOSIS — D485 Neoplasm of uncertain behavior of skin: Secondary | ICD-10-CM | POA: Diagnosis not present

## 2022-06-18 DIAGNOSIS — L308 Other specified dermatitis: Secondary | ICD-10-CM | POA: Diagnosis not present

## 2022-06-18 DIAGNOSIS — L821 Other seborrheic keratosis: Secondary | ICD-10-CM | POA: Diagnosis not present

## 2022-06-22 ENCOUNTER — Other Ambulatory Visit: Payer: Self-pay | Admitting: Physician Assistant

## 2022-06-22 DIAGNOSIS — N959 Unspecified menopausal and perimenopausal disorder: Secondary | ICD-10-CM

## 2022-07-01 DIAGNOSIS — H353231 Exudative age-related macular degeneration, bilateral, with active choroidal neovascularization: Secondary | ICD-10-CM | POA: Diagnosis not present

## 2022-07-29 ENCOUNTER — Other Ambulatory Visit: Payer: Self-pay | Admitting: Physician Assistant

## 2022-07-29 DIAGNOSIS — M7989 Other specified soft tissue disorders: Secondary | ICD-10-CM

## 2022-07-29 DIAGNOSIS — G8929 Other chronic pain: Secondary | ICD-10-CM

## 2022-08-21 DIAGNOSIS — D485 Neoplasm of uncertain behavior of skin: Secondary | ICD-10-CM | POA: Diagnosis not present

## 2022-08-21 DIAGNOSIS — D0439 Carcinoma in situ of skin of other parts of face: Secondary | ICD-10-CM | POA: Diagnosis not present

## 2022-08-26 ENCOUNTER — Other Ambulatory Visit: Payer: Self-pay | Admitting: Physician Assistant

## 2022-08-26 DIAGNOSIS — M62838 Other muscle spasm: Secondary | ICD-10-CM

## 2022-09-15 DIAGNOSIS — M9905 Segmental and somatic dysfunction of pelvic region: Secondary | ICD-10-CM | POA: Diagnosis not present

## 2022-09-15 DIAGNOSIS — M9903 Segmental and somatic dysfunction of lumbar region: Secondary | ICD-10-CM | POA: Diagnosis not present

## 2022-09-15 DIAGNOSIS — M5432 Sciatica, left side: Secondary | ICD-10-CM | POA: Diagnosis not present

## 2022-09-15 DIAGNOSIS — M545 Low back pain, unspecified: Secondary | ICD-10-CM | POA: Diagnosis not present

## 2022-09-18 DIAGNOSIS — H353231 Exudative age-related macular degeneration, bilateral, with active choroidal neovascularization: Secondary | ICD-10-CM | POA: Diagnosis not present

## 2022-09-24 ENCOUNTER — Telehealth: Payer: Self-pay

## 2022-09-24 NOTE — Telephone Encounter (Signed)
LVM for patient to call back [phone], or to call PCP office to schedule follow up apt. AS, CMA  

## 2022-10-03 ENCOUNTER — Other Ambulatory Visit: Payer: Self-pay | Admitting: Physician Assistant

## 2022-10-03 DIAGNOSIS — M62838 Other muscle spasm: Secondary | ICD-10-CM

## 2022-10-16 DIAGNOSIS — M9903 Segmental and somatic dysfunction of lumbar region: Secondary | ICD-10-CM | POA: Diagnosis not present

## 2022-10-16 DIAGNOSIS — M545 Low back pain, unspecified: Secondary | ICD-10-CM | POA: Diagnosis not present

## 2022-10-16 DIAGNOSIS — M5432 Sciatica, left side: Secondary | ICD-10-CM | POA: Diagnosis not present

## 2022-10-16 DIAGNOSIS — M9905 Segmental and somatic dysfunction of pelvic region: Secondary | ICD-10-CM | POA: Diagnosis not present

## 2022-12-01 ENCOUNTER — Other Ambulatory Visit: Payer: Self-pay | Admitting: Physician Assistant

## 2022-12-01 DIAGNOSIS — F411 Generalized anxiety disorder: Secondary | ICD-10-CM

## 2022-12-01 DIAGNOSIS — M62838 Other muscle spasm: Secondary | ICD-10-CM

## 2022-12-03 ENCOUNTER — Telehealth: Payer: Self-pay | Admitting: Physician Assistant

## 2022-12-03 DIAGNOSIS — E7800 Pure hypercholesterolemia, unspecified: Secondary | ICD-10-CM

## 2022-12-03 NOTE — Telephone Encounter (Signed)
[facility] faxed refill request for the following medications:   rosuvastatin (CRESTOR) 5 MG tablet     ALPRAZolam (XANAX) 0.25 MG tablet      Please advise.

## 2022-12-07 MED ORDER — ROSUVASTATIN CALCIUM 5 MG PO TABS: 5.0000 mg | ORAL_TABLET | Freq: Every day | ORAL | 1 refills | Status: DC

## 2022-12-11 ENCOUNTER — Ambulatory Visit (INDEPENDENT_AMBULATORY_CARE_PROVIDER_SITE_OTHER): Payer: PPO | Admitting: Family Medicine

## 2022-12-11 VITALS — BP 149/51 | HR 78 | Temp 97.7°F | Resp 16 | Ht 66.0 in | Wt 136.6 lb

## 2022-12-11 DIAGNOSIS — Z79899 Other long term (current) drug therapy: Secondary | ICD-10-CM

## 2022-12-11 DIAGNOSIS — E034 Atrophy of thyroid (acquired): Secondary | ICD-10-CM | POA: Diagnosis not present

## 2022-12-11 DIAGNOSIS — E7800 Pure hypercholesterolemia, unspecified: Secondary | ICD-10-CM

## 2022-12-11 DIAGNOSIS — Z79890 Hormone replacement therapy (postmenopausal): Secondary | ICD-10-CM

## 2022-12-11 DIAGNOSIS — F411 Generalized anxiety disorder: Secondary | ICD-10-CM | POA: Diagnosis not present

## 2022-12-11 DIAGNOSIS — M8580 Other specified disorders of bone density and structure, unspecified site: Secondary | ICD-10-CM | POA: Diagnosis not present

## 2022-12-11 DIAGNOSIS — Z0000 Encounter for general adult medical examination without abnormal findings: Secondary | ICD-10-CM

## 2022-12-11 MED ORDER — ALPRAZOLAM 0.25 MG PO TABS: 0.1250 mg | ORAL_TABLET | Freq: Every evening | ORAL | 1 refills | Status: DC | PRN

## 2022-12-11 NOTE — Progress Notes (Signed)
Established patient visit   Patient: Jamie [name]   DOB: May 23, 1940   82 y.o. Female  MRN: [id number] Visit Date: 12/11/2022  Today's healthcare provider: Sherlyn Hay, DO   Chief Complaint  Patient presents with   Medication Refill   Subjective    Medication Refill Pertinent negatives include no abdominal pain, chest pain, chills, congestion, fatigue, fever, headaches, nausea, numbness, sore throat, vomiting or weakness.   Takes alprazolam 0.125 mg nightly. - sometimes cuts it down to a quarter tablet instead of a half.  - Husband had a stroke and his short-term memory is bad, so she worries about that  Takes lasix when feet are swollen Estradiol for hot flashes - she still has them if she doesn't take it.  Patient regularly sees ENT for ear irrigation.    Medications: Outpatient Medications Prior to Visit  Medication Sig   Acetaminophen-Caffeine (TENSION HEADACHE) 500-65 MG TABS Take 2 tablets by mouth daily as needed (headaches).   b complex vitamins tablet Take 1 tablet by mouth daily.   cetirizine (ZYRTEC) 10 MG tablet Take 10 mg by mouth daily. As needed   Cholecalciferol (VITAMIN D) 50 MCG (2000 UT) tablet Take 2,000 Units by mouth daily.   esomeprazole (NEXIUM) 20 MG capsule Take 20 mg by mouth daily.   estradiol (ESTRACE) 0.5 MG tablet TAKE 1 TABLET BY MOUTH EVERY DAY   furosemide (LASIX) 20 MG tablet TAKE 1 TABLET BY MOUTH EVERY DAY   meloxicam (MOBIC) 7.5 MG tablet TAKE 1 TABLET BY MOUTH EVERY DAY AS NEEDED   MULTIPLE VITAMIN PO Take 1 tablet by mouth daily.    Multiple Vitamins-Minerals (PRESERVISION AREDS 2 PO) Take 1 tablet by mouth 2 (two) times daily.   nortriptyline (PAMELOR) 50 MG capsule TAKE 1 CAPSULE BY MOUTH EVERYDAY AT BEDTIME   rosuvastatin (CRESTOR) 5 MG tablet Take 1 tablet (5 mg total) by mouth daily.   SYNTHROID 137 MCG tablet TAKE 1 TABLET BY MOUTH EVERY DAY BEFORE BREAKFAST   tiZANidine (ZANAFLEX) 4 MG tablet TAKE 0.5-1 TABLETS  (2-4 MG TOTAL) BY MOUTH EVERY 6 (SIX) HOURS AS NEEDED FOR MUSCLE SPASMS.   vitamin E 1000 UNIT capsule Take 1,000 Units by mouth daily.   [DISCONTINUED] ALPRAZolam (XANAX) 0.25 MG tablet TAKE 0.5 TABLETS (0.125 MG TOTAL) BY MOUTH AT BEDTIME AS NEEDED FOR ANXIETY.   [DISCONTINUED] tacrolimus (PROTOPIC) 0.03 % ointment APPLY TOPICALLY 2 (TWO) TIMES DAILY. FOR UP TO A WEEK   No facility-administered medications prior to visit.    Review of Systems  Constitutional:  Negative for appetite change, chills, fatigue and fever.  HENT:  Negative for congestion, ear pain, rhinorrhea and sore throat.   Eyes:  Negative for visual disturbance.  Respiratory:  Negative for chest tightness and shortness of breath.   Cardiovascular:  Positive for leg swelling (intermittent). Negative for chest pain and palpitations.  Gastrointestinal:  Negative for abdominal pain, nausea and vomiting.  Neurological:  Negative for dizziness, weakness, numbness and headaches.        Objective    BP (!) 149/51 (BP Location: Left Arm, Patient Position: Sitting, Cuff Size: Normal)   Pulse 78   Temp 97.7 F (36.5 C)   Resp 16   Ht 5\' 6"  (1.676 m)   Wt 136 lb 9.6 oz (62 kg)   SpO2 98%   BMI 22.05 kg/m     Physical Exam Vitals and nursing note reviewed.  Constitutional:      General: She  is awake.     Appearance: Normal appearance.  HENT:     Head: Normocephalic and atraumatic.     Right Ear: Ear canal and external ear normal.     Left Ear: Tympanic membrane, ear canal and external ear normal.     Ears:     Comments: Unable to visualize right TM d/t cerumen (nonimpacted)    Nose: Nose normal.     Mouth/Throat:     Mouth: Mucous membranes are moist.     Pharynx: Oropharynx is clear. No oropharyngeal exudate or posterior oropharyngeal erythema.  Eyes:     General: No scleral icterus.    Extraocular Movements: Extraocular movements intact.     Conjunctiva/sclera: Conjunctivae normal.     Pupils: Pupils are  equal, round, and reactive to light.  Neck:     Thyroid: No thyromegaly or thyroid tenderness.  Cardiovascular:     Rate and Rhythm: Normal rate and regular rhythm.     Pulses: Normal pulses.     Heart sounds: Normal heart sounds.  Pulmonary:     Effort: Pulmonary effort is normal. No tachypnea, bradypnea or respiratory distress.     Breath sounds: Normal breath sounds. No stridor. No wheezing, rhonchi or rales.  Abdominal:     General: Bowel sounds are normal. There is no distension.     Palpations: Abdomen is soft. There is no mass.     Tenderness: There is no abdominal tenderness. There is no guarding.     Hernia: No hernia is present.  Musculoskeletal:     Cervical back: Normal range of motion and neck supple.     Right lower leg: Edema (+1) present.     Left lower leg: Edema (+1) present.  Lymphadenopathy:     Cervical: No cervical adenopathy.  Skin:    General: Skin is warm and dry.  Neurological:     Mental Status: She is alert and oriented to person, place, and time. Mental status is at baseline.  Psychiatric:        Mood and Affect: Mood normal.        Behavior: Behavior normal.       No results found for any visits on 12/11/22.  Assessment & Plan    Annual physical exam Assessment & Plan: Physical exam overall unremarkable except as noted above. Routine lab work ordered as noted.   Anxiety state Assessment & Plan: Anxiety overall well-controlled. Alprazolam refill today; last fill 08/26/2022. PDMP appropriate.  Orders: -     ALPRAZolam; Take 0.5 tablets (0.125 mg total) by mouth at bedtime as needed for anxiety.  Dispense: 30 tablet; Refill: 1  High risk medication use Assessment & Plan: Patient currently on long-term esomeprazole.  Will check vitamin B12 level today.  Orders: -     Vitamin B12  Hypercholesteremia Assessment & Plan: No acute concerns.  Continue to monitor. Will check CMP and lipid panel today.  Orders: -     Comprehensive  metabolic panel -     Lipid panel  Osteopenia, unspecified location Assessment & Plan: DEXA January 2019: Femur Neck Left is 0.792 g/cm2 with a T-score of -1.8 Ordered repeat today.  Orders: -     DG Bone Density; Future  Hypothyroidism due to acquired atrophy of thyroid Assessment & Plan: No acute concerns.  Continue to monitor.  Orders: -     TSH Rfx on Abnormal to Free T4  Hormone replacement therapy Assessment & Plan: Patient has previously tried to taper off but  experiences hot flashes whenever cutting her dose in half. Will continue estradiol 0.5 mg daily    Return in about 3 months (around 03/13/2023) for Thyroid, Anx/Dep.      I discussed the assessment and treatment plan with the patient  The patient was provided an opportunity to ask questions and all were answered. The patient agreed with the plan and demonstrated an understanding of the instructions.   The patient was advised to call back or seek an in-person evaluation if the symptoms worsen or if the condition fails to improve as anticipated.    Sherlyn Hay, DO  [facility] [phone] (phone) [phone] (fax)  [facility]

## 2022-12-11 NOTE — Assessment & Plan Note (Signed)
DEXA January 2019: Femur Neck Left is 0.792 g/cm2 with a T-score of -1.8 Ordered repeat today.

## 2022-12-11 NOTE — Assessment & Plan Note (Signed)
Patient has previously tried to taper off but experiences hot flashes whenever cutting her dose in half. Will continue estradiol 0.5 mg daily

## 2022-12-11 NOTE — Patient Instructions (Signed)
Check your blood pressure periodically, and any time you have concerning symptoms like headache, chest pain, dizziness, shortness of breath, or vision changes.   Our goal is less than 140/90.  To appropriately check your blood pressure, make sure you do the following:  1) Avoid caffeine, exercise, or tobacco products for 30 minutes before checking. Empty your bladder. 2) Sit with your back supported in a flat-backed chair. Rest your arm on something flat (arm of the chair, table, etc). 3) Sit still with your feet flat on the floor, resting, for at least 5 minutes.  4) Check your blood pressure. Take 1-2 readings.  5) Write down these readings and bring with you to any provider appointments.  Bring your home blood pressure machine with you to a provider's office for accuracy comparison at least once a year.   Make sure you take your blood pressure medications before you come to any office visit, even if you were asked to fast for labs.

## 2022-12-11 NOTE — Assessment & Plan Note (Addendum)
Anxiety overall well-controlled. Alprazolam refill today; last fill 08/26/2022. PDMP appropriate.

## 2022-12-11 NOTE — Assessment & Plan Note (Signed)
Physical exam overall unremarkable except as noted above. Routine lab work ordered as noted.

## 2022-12-12 ENCOUNTER — Other Ambulatory Visit: Payer: Self-pay | Admitting: Physician Assistant

## 2022-12-12 DIAGNOSIS — E034 Atrophy of thyroid (acquired): Secondary | ICD-10-CM

## 2022-12-14 ENCOUNTER — Encounter: Payer: Self-pay | Admitting: *Deleted

## 2022-12-14 NOTE — Telephone Encounter (Signed)
Requested medication (s) are due for refill today:   Yes  Requested medication (s) are on the active medication list:   Yes  Future visit scheduled:   Just seen on 10/18 by Dr. Payton Mccallum for CPE   Last ordered: 06/18/2022 #90, 1 refill   Unable to refill due to TSH being due per protocol.  Requested Prescriptions  Pending Prescriptions Disp Refills   SYNTHROID 137 MCG tablet [Pharmacy Med Name: SYNTHROID 137 MCG TABLET] 90 tablet 1    Sig: TAKE 1 TABLET BY MOUTH EVERY DAY BEFORE BREAKFAST     Endocrinology:  Hypothyroid Agents Failed - 12/12/2022  1:14 AM      Failed - TSH in normal range and within 360 days    TSH  Date Value Ref Range Status  07/04/2021 1.360 0.450 - 4.500 uIU/mL Final         Passed - Valid encounter within last 12 months    Recent Outpatient Visits           3 days ago Annual physical exam   [facility] [place], Monico Blitz, DO   6 months ago Encounter for annual wellness exam in Medicare patient   [facility] [place], [place], PA-C   10 months ago Eczema, unspecified type   [facility] [name], PA-C   11 months ago Pain of left calf   [facility] [name], PA-C   1 year ago Menopausal and perimenopausal disorder   [facility] [name], [place]

## 2022-12-16 DIAGNOSIS — H353231 Exudative age-related macular degeneration, bilateral, with active choroidal neovascularization: Secondary | ICD-10-CM | POA: Diagnosis not present

## 2022-12-20 ENCOUNTER — Other Ambulatory Visit: Payer: Self-pay | Admitting: Physician Assistant

## 2022-12-20 DIAGNOSIS — N959 Unspecified menopausal and perimenopausal disorder: Secondary | ICD-10-CM

## 2022-12-21 ENCOUNTER — Encounter: Payer: Self-pay | Admitting: Family Medicine

## 2022-12-21 NOTE — Assessment & Plan Note (Signed)
Patient currently on long-term esomeprazole.  Will check vitamin B12 level today.

## 2022-12-21 NOTE — Assessment & Plan Note (Signed)
No acute concerns.  Continue to monitor. Will check CMP and lipid panel today.

## 2022-12-21 NOTE — Assessment & Plan Note (Signed)
No acute concerns.  Continue to monitor.

## 2022-12-22 DIAGNOSIS — M9903 Segmental and somatic dysfunction of lumbar region: Secondary | ICD-10-CM | POA: Diagnosis not present

## 2022-12-22 DIAGNOSIS — M9905 Segmental and somatic dysfunction of pelvic region: Secondary | ICD-10-CM | POA: Diagnosis not present

## 2022-12-22 DIAGNOSIS — M5432 Sciatica, left side: Secondary | ICD-10-CM | POA: Diagnosis not present

## 2022-12-22 DIAGNOSIS — M545 Low back pain, unspecified: Secondary | ICD-10-CM | POA: Diagnosis not present

## 2023-01-11 ENCOUNTER — Other Ambulatory Visit: Payer: Self-pay | Admitting: Physician Assistant

## 2023-01-11 DIAGNOSIS — M62838 Other muscle spasm: Secondary | ICD-10-CM

## 2023-01-11 DIAGNOSIS — E034 Atrophy of thyroid (acquired): Secondary | ICD-10-CM

## 2023-01-11 DIAGNOSIS — F411 Generalized anxiety disorder: Secondary | ICD-10-CM

## 2023-01-25 DIAGNOSIS — M5432 Sciatica, left side: Secondary | ICD-10-CM | POA: Diagnosis not present

## 2023-01-25 DIAGNOSIS — M9903 Segmental and somatic dysfunction of lumbar region: Secondary | ICD-10-CM | POA: Diagnosis not present

## 2023-01-25 DIAGNOSIS — M545 Low back pain, unspecified: Secondary | ICD-10-CM | POA: Diagnosis not present

## 2023-01-25 DIAGNOSIS — M9905 Segmental and somatic dysfunction of pelvic region: Secondary | ICD-10-CM | POA: Diagnosis not present

## 2023-02-12 ENCOUNTER — Ambulatory Visit: Payer: Self-pay | Admitting: *Deleted

## 2023-02-12 NOTE — Telephone Encounter (Signed)
Message from [place] T sent at 02/12/2023  9:04 AM EST  Summary: medication request   Patient called stated she is has a possible sinus infection. Nasal congestion and face hurt over and under eyes. Patient is requesting medication (Z-pak) for symptom. Please f/u with patient          Call History  Contact Date/Time Type Contact Phone/Fax By  02/12/2023 09:03 AM EST Phone (Incoming) Pasty Arch, Jamie [name]" (Self) [phone] Rexene Edison) [place] Jester

## 2023-02-12 NOTE — Telephone Encounter (Signed)
Reason for Disposition  [1] Sinus congestion (pressure, fullness) AND [2] present > 10 days  Answer Assessment - Initial Assessment Questions 1. LOCATION: "Where does it hurt?"      Nasal congestion started a week ago as an allergy attack.   My face is hurting and I have a headache and nasal congestion.    2. ONSET: "When did the sinus pain start?"  (e.g., hours, days)      A week ago 3. SEVERITY: "How bad is the pain?"   (Scale 1-10; mild, moderate or severe)   - MILD (1-3): doesn't interfere with normal activities    - MODERATE (4-7): interferes with normal activities (e.g., work or school) or awakens from sleep   - SEVERE (8-10): excruciating pain and patient unable to do any normal activities        Moderate 4. RECURRENT SYMPTOM: "Have you ever had sinus problems before?" If Yes, ask: "When was the last time?" and "What happened that time?"      Yes with the seasons 5. NASAL CONGESTION: "Is the nose blocked?" If Yes, ask: "Can you open it or must you breathe through your mouth?"     Yes 6. NASAL DISCHARGE: "Do you have discharge from your nose?" If so ask, "What color?"     It's mostly clogged up.   Not much coming out.   The back upper part of my throat is irritated.    7. FEVER: "Do you have a fever?" If Yes, ask: "What is it, how was it measured, and when did it start?"      No 8. OTHER SYMPTOMS: "Do you have any other symptoms?" (e.g., sore throat, cough, earache, difficulty breathing)     Facial pain and pressure around my eyes.    9. PREGNANCY: "Is there any chance you are pregnant?" "When was your last menstrual period?"     N/A due to age  Protocols used: Sinus Pain or Congestion-A-AH  Chief Complaint: Sinus congestion for a week. Symptoms: Facial pain and pressure around her eyes.   Nothing coming out. Frequency: Every fall I get this.  Symptoms have been going on for a week now.   Pertinent Negatives: Patient denies using nasal saline spray.   She is using  Mucinex. Disposition: [] ED /[] Urgent Care (no appt availability in office) / [x] Appointment(In office/virtual)/ []  [facility]/ [] Home Care/ [] Refused Recommended Disposition /[] [place] [facility]/ []  Follow-up with PCP Additional Notes: First available appt made with Dr. Payton Mccallum for 02/15/2023 at 3:40.

## 2023-02-15 ENCOUNTER — Encounter: Payer: Self-pay | Admitting: Family Medicine

## 2023-02-15 ENCOUNTER — Ambulatory Visit (INDEPENDENT_AMBULATORY_CARE_PROVIDER_SITE_OTHER): Payer: PPO | Admitting: Family Medicine

## 2023-02-15 ENCOUNTER — Other Ambulatory Visit: Payer: Self-pay | Admitting: Family Medicine

## 2023-02-15 VITALS — BP 124/53 | HR 79 | Temp 98.7°F | Ht 66.0 in | Wt 138.0 lb

## 2023-02-15 DIAGNOSIS — J019 Acute sinusitis, unspecified: Secondary | ICD-10-CM

## 2023-02-15 DIAGNOSIS — B9689 Other specified bacterial agents as the cause of diseases classified elsewhere: Secondary | ICD-10-CM | POA: Diagnosis not present

## 2023-02-15 DIAGNOSIS — F411 Generalized anxiety disorder: Secondary | ICD-10-CM

## 2023-02-15 MED ORDER — AZITHROMYCIN 250 MG PO TABS: ORAL_TABLET | ORAL | 0 refills | Status: AC

## 2023-02-15 NOTE — Progress Notes (Signed)
Established patient visit   Patient: Jamie [name]   DOB: [birth year]   82 y.o. Female  MRN: [id number] Visit Date: 02/15/2023  Today's healthcare provider: Sherlyn Hay, DO   Chief Complaint  Patient presents with   Sinusitis    Patient complains of having sinus congestion for 3 weeks.  She states she did at one point have sore throat but that has resolved.  She reports that she has not actually gotten worse but with Christmas coming up she thought it time to get checked.  She said her husband was in the hospital prior to her getting sick and she and her daughter were in there visiting with him a lot.  Her daughter got sick as well and has been treated with an antibiotic and is doing better.    Subjective    HPI CC: Sinus congestion for a week  DEXA scan ordered 12/11/2022; not able to do it yet or plans to do it at the same time as mammogram in January?    The patient, with an ongoing illness for the past three weeks, reports no fever or chills. The primary complaint is difficulty breathing, particularly at night, when the patient wakes up feeling congested. Upon moving around, the congestion seems to clear up slightly, but never completely resolves. The patient denies having a runny nose but reports blowing out some mucus and experiencing postnasal drip. There is no reported ear pain or chest pain.  The patient has been experiencing a cough described as a tickle, which once started, is difficult to stop. There is no reported wheezing. The patient reports facial pain and headaches, particularly upon waking up in the morning. There is no reported sneezing or sore throat. The patient's appetite and daily activities have not been affected. There is no reported nausea, vomiting, diarrhea, or abdominal pain.  The patient has a history of regular ear cleaning at an ENT clinic. The patient has been taking Mucinex. The patient reports that, outside of a COVID-19 infection about a month  ago, it has been years since the last illness episode.    The patient's blood pressure was noted to be slightly elevated during the visit. The patient's overall health is reported to be good, with the exception of the current illness. The patient reports that the mornings are particularly difficult due to the symptoms.     Medications: Outpatient Medications Prior to Visit  Medication Sig   Acetaminophen-Caffeine (TENSION HEADACHE) 500-65 MG TABS Take 2 tablets by mouth daily as needed (headaches).   ALPRAZolam (XANAX) 0.25 MG tablet Take 0.5 tablets (0.125 mg total) by mouth at bedtime as needed for anxiety.   b complex vitamins tablet Take 1 tablet by mouth daily.   cetirizine (ZYRTEC) 10 MG tablet Take 10 mg by mouth daily. As needed   Cholecalciferol (VITAMIN D) 50 MCG (2000 UT) tablet Take 2,000 Units by mouth daily.   esomeprazole (NEXIUM) 20 MG capsule Take 20 mg by mouth daily.   estradiol (ESTRACE) 0.5 MG tablet TAKE 1 TABLET BY MOUTH EVERY DAY   furosemide (LASIX) 20 MG tablet TAKE 1 TABLET BY MOUTH EVERY DAY   levothyroxine (SYNTHROID) 137 MCG tablet TAKE 1 TABLET BY MOUTH EVERY DAY BEFORE BREAKFAST   meloxicam (MOBIC) 7.5 MG tablet TAKE 1 TABLET BY MOUTH EVERY DAY AS NEEDED   MULTIPLE VITAMIN PO Take 1 tablet by mouth daily.    Multiple Vitamins-Minerals (PRESERVISION AREDS 2 PO) Take 1 tablet by mouth  2 (two) times daily.   nortriptyline (PAMELOR) 50 MG capsule TAKE 1 CAPSULE BY MOUTH EVERYDAY AT BEDTIME   rosuvastatin (CRESTOR) 5 MG tablet Take 1 tablet (5 mg total) by mouth daily.   tiZANidine (ZANAFLEX) 4 MG tablet TAKE 0.5-1 TABLETS (2-4 MG TOTAL) BY MOUTH EVERY 6 (SIX) HOURS AS NEEDED FOR MUSCLE SPASMS.   vitamin E 1000 UNIT capsule Take 1,000 Units by mouth daily.   No facility-administered medications prior to visit.    Review of Systems  Constitutional:  Negative for appetite change, chills, fatigue and fever.  HENT:  Positive for congestion, ear pain, postnasal  drip, sinus pressure and sinus pain. Negative for rhinorrhea, sneezing and sore throat.   Respiratory:  Positive for cough and shortness of breath (due to congestion in middle of night). Negative for chest tightness.   Cardiovascular:  Negative for chest pain and palpitations.  Gastrointestinal:  Negative for abdominal pain, diarrhea, nausea and vomiting.  Neurological:  Negative for dizziness and weakness.        Objective    BP (!) 124/53 (BP Location: Right Arm, Patient Position: Sitting, Cuff Size: Normal)   Pulse 79   Temp 98.7 F (37.1 C) (Oral)   Ht 5\' 6"  (1.676 m)   Wt 138 lb (62.6 kg)   SpO2 99%   BMI 22.27 kg/m     Physical Exam Vitals and nursing note reviewed.  Constitutional:      General: She is awake.     Appearance: Normal appearance.  HENT:     Head: Normocephalic and atraumatic.     Right Ear: External ear normal.     Left Ear: Tympanic membrane, ear canal and external ear normal.     Ears:     Comments: Cerumen obscuring canal on right side, not impacted    Nose: Nose normal.     Mouth/Throat:     Mouth: Mucous membranes are moist.     Pharynx: Oropharynx is clear. No oropharyngeal exudate or posterior oropharyngeal erythema.  Eyes:     General: No scleral icterus.    Extraocular Movements: Extraocular movements intact.     Conjunctiva/sclera: Conjunctivae normal.     Pupils: Pupils are equal, round, and reactive to light.  Neck:     Thyroid: No thyromegaly or thyroid tenderness.  Cardiovascular:     Rate and Rhythm: Normal rate and regular rhythm.     Pulses: Normal pulses.     Heart sounds: Normal heart sounds.  Pulmonary:     Effort: Pulmonary effort is normal. No tachypnea, bradypnea or respiratory distress.     Breath sounds: Normal breath sounds. No stridor. No wheezing, rhonchi or rales.  Abdominal:     General: Bowel sounds are normal. There is no distension.     Palpations: Abdomen is soft. There is no mass.     Tenderness: There is  no abdominal tenderness. There is no guarding.     Hernia: No hernia is present.  Musculoskeletal:     Cervical back: Normal range of motion and neck supple.     Right lower leg: No edema.     Left lower leg: No edema.  Lymphadenopathy:     Cervical: No cervical adenopathy.  Skin:    General: Skin is warm and dry.  Neurological:     Mental Status: She is alert and oriented to person, place, and time. Mental status is at baseline.  Psychiatric:        Mood and Affect: Mood normal.  Behavior: Behavior normal.     No results found for any visits on 02/15/23.  Assessment & Plan    Acute bacterial rhinosinusitis -     Azithromycin; Take 2 tablets on day 1, then 1 tablet daily on days 2 through 5  Dispense: 6 tablet; Refill: 0  Symptoms ongoing for three weeks, including nighttime nasal congestion, postnasal drip, sinus pain, and pressure. No fever, chills, ear pain, chest pain, or wheezing. Cough described as a tickle that is difficult to stop. No sneezing or sore throat. Symptoms worse in the morning. Previous effective treatment with azithromycin (Z-Pak). Discussed azithromycin (Z-Pak) and alternative treatments including other antibiotics or symptomatic treatment with decongestants and antihistamines. Advised further evaluation if symptoms do not improve by Thursday. - Prescribe azithromycin (Z-Pak) - Continue Mucinex - Advise to monitor symptoms and return if not improving Defer vaccines due to current illness   Return if symptoms worsen or fail to improve.      I discussed the assessment and treatment plan with the patient  The patient was provided an opportunity to ask questions and all were answered. The patient agreed with the plan and demonstrated an understanding of the instructions.   The patient was advised to call back or seek an in-person evaluation if the symptoms worsen or if the condition fails to improve as anticipated.    Sherlyn Hay, DO  [facility]  [facility] [phone] (phone) [phone] (fax)  [facility]

## 2023-02-16 NOTE — Telephone Encounter (Signed)
Rx 01/14/23 #90- too soon Requested Prescriptions  Pending Prescriptions Disp Refills   nortriptyline (PAMELOR) 50 MG capsule [Pharmacy Med Name: NORTRIPTYLINE HCL 50 MG CAP] 90 capsule 0    Sig: TAKE 1 CAPSULE BY MOUTH EVERYDAY AT BEDTIME     Psychiatry:  Antidepressants - Heterocyclics (TCAs) Passed - 02/16/2023  2:42 PM      Passed - Valid encounter within last 6 months    Recent Outpatient Visits           Yesterday Acute bacterial rhinosinusitis   [facility] [place], Monico Blitz, DO   2 months ago Annual physical exam   [facility] [place], Monico Blitz, DO   9 months ago Encounter for annual wellness exam in Medicare patient   [facility], [place], PA-C   1 year ago Eczema, unspecified type   [facility] [name], PA-C   1 year ago Pain of left calf   [facility] [name], [place]

## 2023-02-17 ENCOUNTER — Other Ambulatory Visit: Payer: Self-pay | Admitting: Family Medicine

## 2023-02-17 DIAGNOSIS — M62838 Other muscle spasm: Secondary | ICD-10-CM

## 2023-03-16 ENCOUNTER — Other Ambulatory Visit: Payer: Self-pay | Admitting: Family Medicine

## 2023-03-16 DIAGNOSIS — F411 Generalized anxiety disorder: Secondary | ICD-10-CM

## 2023-03-16 DIAGNOSIS — E034 Atrophy of thyroid (acquired): Secondary | ICD-10-CM

## 2023-03-19 ENCOUNTER — Encounter (HOSPITAL_BASED_OUTPATIENT_CLINIC_OR_DEPARTMENT_OTHER): Payer: Self-pay

## 2023-04-13 ENCOUNTER — Encounter: Payer: Self-pay | Admitting: Family Medicine

## 2023-04-13 ENCOUNTER — Ambulatory Visit (INDEPENDENT_AMBULATORY_CARE_PROVIDER_SITE_OTHER): Payer: PPO | Admitting: Family Medicine

## 2023-04-13 VITALS — BP 117/50 | HR 73 | Resp 18 | Ht 66.0 in | Wt 132.8 lb

## 2023-04-13 DIAGNOSIS — H6121 Impacted cerumen, right ear: Secondary | ICD-10-CM | POA: Insufficient documentation

## 2023-04-13 DIAGNOSIS — M2550 Pain in unspecified joint: Secondary | ICD-10-CM

## 2023-04-13 DIAGNOSIS — E034 Atrophy of thyroid (acquired): Secondary | ICD-10-CM | POA: Diagnosis not present

## 2023-04-13 DIAGNOSIS — G8929 Other chronic pain: Secondary | ICD-10-CM

## 2023-04-13 DIAGNOSIS — F411 Generalized anxiety disorder: Secondary | ICD-10-CM | POA: Diagnosis not present

## 2023-04-13 MED ORDER — ALPRAZOLAM 0.25 MG PO TABS: 0.1250 mg | ORAL_TABLET | Freq: Every evening | ORAL | 1 refills | Status: DC | PRN

## 2023-04-13 NOTE — Assessment & Plan Note (Signed)
 Reports experiencing fullness in the ears and difficulty hearing, particularly in the afternoon. Examination revealed cerumen impaction in the right ear. - Manual removal of cerumen impaction, partially successful

## 2023-04-13 NOTE — Assessment & Plan Note (Signed)
 Experiences joint pain and takes meloxicam as needed. Reports using it occasionally when the pain is severe. - Continue meloxicam as needed for joint pain

## 2023-04-13 NOTE — Assessment & Plan Note (Signed)
 On Synthroid for hypothyroidism. Needs thyroid function tests due to lack of recent lab work. - Reprint lab work order for thyroid function tests

## 2023-04-13 NOTE — Progress Notes (Signed)
 Established patient visit   Patient: Jamie [name]   DOB: Sep 11, 1940   83 y.o. Female  MRN: [id number] Visit Date: 04/13/2023  Today's healthcare provider: Sherlyn Hay, DO   Chief Complaint  Patient presents with   Ear Fullness    bilateral   Subjective    Ear Fullness   Jamie [name] "Dennie Bible" is an 83 year old female who presents with ear fullness and difficulty hearing.  She experiences a sensation of fullness in her ears, particularly in the afternoon, which she describes as feeling like it 'blocks up'. This is accompanied by difficulty hearing. She has not used any ear drops or treatments for this issue.  She has increased her alprazolam dosage from a quarter to a half tablet due to recent emotional stress following the death of her husband a month ago. Sleep has been difficult, but she feels she is managing with the current dosage. She plans to reduce the dosage back to a quarter tablet if her sleep improves.  She is currently taking Synthroid, nortriptyline, and meloxicam. She uses meloxicam as needed for joint pain, which she experiences frequently. She has not had recent lab work for her Synthroid due to caregiving responsibilities for her late husband.  She has not received the COVID booster and does not plan to. She is aware of the new pneumonia vaccine but is unsure whether she should get it. She has not had the shingles vaccine but is considering it. She is also potentially due for a tetanus vaccine.     Medications: Outpatient Medications Prior to Visit  Medication Sig   Acetaminophen-Caffeine (TENSION HEADACHE) 500-65 MG TABS Take 2 tablets by mouth daily as needed (headaches).   b complex vitamins tablet Take 1 tablet by mouth daily.   cetirizine (ZYRTEC) 10 MG tablet Take 10 mg by mouth daily. As needed   Cholecalciferol (VITAMIN D) 50 MCG (2000 UT) tablet Take 2,000 Units by mouth daily.   esomeprazole (NEXIUM) 20 MG capsule Take 20 mg by mouth daily.    estradiol (ESTRACE) 0.5 MG tablet TAKE 1 TABLET BY MOUTH EVERY DAY   furosemide (LASIX) 20 MG tablet TAKE 1 TABLET BY MOUTH EVERY DAY   meloxicam (MOBIC) 7.5 MG tablet TAKE 1 TABLET BY MOUTH EVERY DAY AS NEEDED   MULTIPLE VITAMIN PO Take 1 tablet by mouth daily.    Multiple Vitamins-Minerals (PRESERVISION AREDS 2 PO) Take 1 tablet by mouth 2 (two) times daily.   nortriptyline (PAMELOR) 50 MG capsule TAKE 1 CAPSULE BY MOUTH EVERYDAY AT BEDTIME   rosuvastatin (CRESTOR) 5 MG tablet Take 1 tablet (5 mg total) by mouth daily.   SYNTHROID 137 MCG tablet TAKE 1 TABLET BY MOUTH EVERY DAY BEFORE BREAKFAST   tiZANidine (ZANAFLEX) 4 MG tablet TAKE 0.5-1 TABLETS (2-4 MG TOTAL) BY MOUTH EVERY 6 (SIX) HOURS AS NEEDED FOR MUSCLE SPASMS.   vitamin E 1000 UNIT capsule Take 1,000 Units by mouth daily.   [DISCONTINUED] ALPRAZolam (XANAX) 0.25 MG tablet Take 0.5 tablets (0.125 mg total) by mouth at bedtime as needed for anxiety.   No facility-administered medications prior to visit.        Objective    BP (!) 117/50 (BP Location: Left Arm, Patient Position: Sitting, Cuff Size: Normal)   Pulse 73   Resp 18   Ht 5\' 6"  (1.676 m)   Wt 132 lb 12.8 oz (60.2 kg)   SpO2 100%   BMI 21.43 kg/m  Physical Exam Vitals and nursing note reviewed.  Constitutional:      General: She is not in acute distress.    Appearance: Normal appearance.  HENT:     Head: Normocephalic and atraumatic.     Right Ear: External ear normal. Decreased hearing noted. There is impacted cerumen.     Left Ear: Hearing, tympanic membrane, ear canal and external ear normal. There is no impacted cerumen.  Eyes:     General: No scleral icterus.    Conjunctiva/sclera: Conjunctivae normal.  Cardiovascular:     Rate and Rhythm: Normal rate.  Pulmonary:     Effort: Pulmonary effort is normal.  Neurological:     Mental Status: She is alert and oriented to person, place, and time. Mental status is at baseline.  Psychiatric:         Mood and Affect: Mood normal.        Behavior: Behavior normal.      No results found for any visits on 04/13/23.  Assessment & Plan    Impacted cerumen of right ear Assessment & Plan: Reports experiencing fullness in the ears and difficulty hearing, particularly in the afternoon. Examination revealed cerumen impaction in the right ear. - Manual removal of cerumen impaction, partially successful   Anxiety state Assessment & Plan: Experiencing increased anxiety and difficulty sleeping following the recent death of her husband. Increased alprazolam dosage from a quarter to a half tablet to manage symptoms. Discussed the importance of not misusing the medication and the plan to reduce the dosage when symptoms improve. - Send prescription for alprazolam  Orders: -     ALPRAZolam; Take 0.5 tablets (0.125 mg total) by mouth at bedtime as needed for anxiety.  Dispense: 30 tablet; Refill: 1  Hypothyroidism due to acquired atrophy of thyroid Assessment & Plan: On Synthroid for hypothyroidism. Needs thyroid function tests due to lack of recent lab work. - Reprint lab work order for thyroid function tests   Chronic pain of multiple joints Assessment & Plan: Experiences joint pain and takes meloxicam as needed. Reports using it occasionally when the pain is severe. - Continue meloxicam as needed for joint pain    General Health Maintenance Up to date on most vaccinations but has not received the COVID booster, is undecided about the new pneumonia vaccine, and has not had the shingles vaccine. Due for a tetanus booster. Needs to schedule a bone density scan and a mammogram. Discussed the potential benefits and risks of the Prevnar 20 vaccine, but she declined. Discussed the importance of the shingles vaccine, especially due to the risk of nerve pain and complications affecting vision and hearing. Informed her that the tetanus booster and shingles vaccine can be obtained at the pharmacy. -  Discuss availability of COVID booster at the pharmacy - Recommend shingles vaccine and inform patient it can be obtained at the pharmacy - Recommend tetanus booster and inform patient it can be obtained at the pharmacy - Remind patient to schedule bone density scan - Remind patient to schedule mammogram   Return in about 6 weeks (around 05/22/2023) for for mAWV with AWV nurse; 07/11/2023 for chronic f/u.      I discussed the assessment and treatment plan with the patient  The patient was provided an opportunity to ask questions and all were answered. The patient agreed with the plan and demonstrated an understanding of the instructions.   The patient was advised to call back or seek an in-person evaluation if the symptoms worsen or  if the condition fails to improve as anticipated.    Sherlyn Hay, DO  [facility] [phone] (phone) [phone] (fax)  [facility]

## 2023-04-13 NOTE — Assessment & Plan Note (Signed)
 Experiencing increased anxiety and difficulty sleeping following the recent death of her husband. Increased alprazolam dosage from a quarter to a half tablet to manage symptoms. Discussed the importance of not misusing the medication and the plan to reduce the dosage when symptoms improve. - Send prescription for alprazolam

## 2023-04-13 NOTE — Patient Instructions (Signed)
 Please schedule your bone density test.  Please call the [facility] [phone]) to schedule.

## 2023-04-21 DIAGNOSIS — Z79899 Other long term (current) drug therapy: Secondary | ICD-10-CM | POA: Diagnosis not present

## 2023-04-21 DIAGNOSIS — E7800 Pure hypercholesterolemia, unspecified: Secondary | ICD-10-CM | POA: Diagnosis not present

## 2023-04-21 DIAGNOSIS — E034 Atrophy of thyroid (acquired): Secondary | ICD-10-CM | POA: Diagnosis not present

## 2023-04-22 LAB — COMPREHENSIVE METABOLIC PANEL
ALT: 16 IU/L (ref 0–32)
AST: 27 IU/L (ref 0–40)
Albumin: 4.4 g/dL (ref 3.7–4.7)
Alkaline Phosphatase: 55 IU/L (ref 44–121)
BUN/Creatinine Ratio: 18 (ref 12–28)
BUN: 14 mg/dL (ref 8–27)
Bilirubin Total: 0.3 mg/dL (ref 0.0–1.2)
CO2: 25 mmol/L (ref 20–29)
Calcium: 9.8 mg/dL (ref 8.7–10.3)
Chloride: 103 mmol/L (ref 96–106)
Creatinine, Ser: 0.79 mg/dL (ref 0.57–1.00)
Globulin, Total: 2.4 g/dL (ref 1.5–4.5)
Glucose: 90 mg/dL (ref 70–99)
Potassium: 4.6 mmol/L (ref 3.5–5.2)
Sodium: 143 mmol/L (ref 134–144)
Total Protein: 6.8 g/dL (ref 6.0–8.5)
eGFR: 75 mL/min/{1.73_m2} (ref >59)

## 2023-04-22 LAB — LIPID PANEL
Chol/HDL Ratio: 2.2 ratio (ref 0.0–4.4)
Cholesterol, Total: 168 mg/dL (ref 100–199)
HDL: 78 mg/dL (ref >39)
LDL Chol Calc (NIH): 65 mg/dL (ref 0–99)
Triglycerides: 149 mg/dL (ref 0–149)
VLDL Cholesterol Cal: 25 mg/dL (ref 5–40)

## 2023-04-22 LAB — TSH RFX ON ABNORMAL TO FREE T4: TSH: 1.88 u[IU]/mL (ref 0.450–4.500)

## 2023-04-22 LAB — VITAMIN B12: Vitamin B-12: 1945 pg/mL — ABNORMAL HIGH (ref 232–1245)

## 2023-04-24 ENCOUNTER — Other Ambulatory Visit: Payer: Self-pay | Admitting: Physician Assistant

## 2023-04-24 DIAGNOSIS — G8929 Other chronic pain: Secondary | ICD-10-CM

## 2023-04-28 ENCOUNTER — Other Ambulatory Visit: Payer: Self-pay | Admitting: Family Medicine

## 2023-04-28 DIAGNOSIS — N959 Unspecified menopausal and perimenopausal disorder: Secondary | ICD-10-CM

## 2023-04-28 DIAGNOSIS — F411 Generalized anxiety disorder: Secondary | ICD-10-CM

## 2023-04-29 NOTE — Telephone Encounter (Signed)
 Requested Prescriptions  Pending Prescriptions Disp Refills   estradiol (ESTRACE) 0.5 MG tablet [Pharmacy Med Name: ESTRADIOL 0.5 MG TABLET] 90 tablet 0    Sig: TAKE 1 TABLET BY MOUTH EVERY DAY     OB/GYN:  Estrogens Failed - 04/29/2023  8:39 AM      Failed - Mammogram is up-to-date per Health Maintenance      Passed - Last BP in normal range    BP Readings from Last 1 Encounters:  04/13/23 (!) 117/50         Passed - Valid encounter within last 12 months    Recent Outpatient Visits           2 months ago Acute bacterial rhinosinusitis   [facility] Pardue, Monico Blitz, DO   4 months ago Annual physical exam   [facility] Pardue, Monico Blitz, DO   11 months ago Encounter for annual wellness exam in Medicare patient   [facility] Edwards, [place], PA-C   1 year ago Eczema, unspecified type   [facility] [name], PA-C   1 year ago Pain of left calf   [facility] [name], PA-C       Future Appointments             In 4 months Pardue, Monico Blitz, DO [facility]             nortriptyline (PAMELOR) 50 MG capsule [Pharmacy Med Name: NORTRIPTYLINE HCL 50 MG CAP] 90 capsule 0    Sig: TAKE 1 CAPSULE BY MOUTH EVERYDAY AT BEDTIME     Psychiatry:  Antidepressants - Heterocyclics (TCAs) Passed - 04/29/2023  8:39 AM      Passed - Valid encounter within last 6 months    Recent Outpatient Visits           2 months ago Acute bacterial rhinosinusitis   [facility] Pardue, Monico Blitz, DO   4 months ago Annual physical exam   [facility] Pardue, Monico Blitz, DO   11 months ago Encounter for annual wellness exam in Medicare patient   [facility] Ok Edwards, [place], PA-C   1 year ago Eczema, unspecified type   [facility]  [name], PA-C   1 year ago Pain of left calf   [facility] [name], PA-C       Future Appointments             In 4 months Pardue, Monico Blitz, DO [place] [facility], [facility]

## 2023-05-03 ENCOUNTER — Encounter: Payer: Self-pay | Admitting: Family Medicine

## 2023-05-05 DIAGNOSIS — H902 Conductive hearing loss, unspecified: Secondary | ICD-10-CM | POA: Diagnosis not present

## 2023-05-05 DIAGNOSIS — H6123 Impacted cerumen, bilateral: Secondary | ICD-10-CM | POA: Diagnosis not present

## 2023-05-11 ENCOUNTER — Other Ambulatory Visit: Payer: Self-pay | Admitting: Family Medicine

## 2023-05-11 DIAGNOSIS — Z1231 Encounter for screening mammogram for malignant neoplasm of breast: Secondary | ICD-10-CM

## 2023-05-21 ENCOUNTER — Ambulatory Visit
Admission: RE | Admit: 2023-05-21 | Discharge: 2023-05-21 | Disposition: A | Discharge status: Home / Self Care | Source: Ambulatory Visit | Attending: Family Medicine | Admitting: Family Medicine

## 2023-05-21 DIAGNOSIS — Z1231 Encounter for screening mammogram for malignant neoplasm of breast: Secondary | ICD-10-CM | POA: Insufficient documentation

## 2023-05-25 DIAGNOSIS — M9905 Segmental and somatic dysfunction of pelvic region: Secondary | ICD-10-CM | POA: Diagnosis not present

## 2023-05-25 DIAGNOSIS — M5432 Sciatica, left side: Secondary | ICD-10-CM | POA: Diagnosis not present

## 2023-05-25 DIAGNOSIS — M9903 Segmental and somatic dysfunction of lumbar region: Secondary | ICD-10-CM | POA: Diagnosis not present

## 2023-05-25 DIAGNOSIS — M545 Low back pain, unspecified: Secondary | ICD-10-CM | POA: Diagnosis not present

## 2023-06-01 DIAGNOSIS — M9905 Segmental and somatic dysfunction of pelvic region: Secondary | ICD-10-CM | POA: Diagnosis not present

## 2023-06-01 DIAGNOSIS — M545 Low back pain, unspecified: Secondary | ICD-10-CM | POA: Diagnosis not present

## 2023-06-01 DIAGNOSIS — M9903 Segmental and somatic dysfunction of lumbar region: Secondary | ICD-10-CM | POA: Diagnosis not present

## 2023-06-01 DIAGNOSIS — M5432 Sciatica, left side: Secondary | ICD-10-CM | POA: Diagnosis not present

## 2023-06-02 ENCOUNTER — Encounter: Payer: Self-pay | Admitting: Family Medicine

## 2023-06-09 DIAGNOSIS — H353231 Exudative age-related macular degeneration, bilateral, with active choroidal neovascularization: Secondary | ICD-10-CM | POA: Diagnosis not present

## 2023-06-18 ENCOUNTER — Other Ambulatory Visit: Payer: Self-pay | Admitting: Family Medicine

## 2023-06-18 DIAGNOSIS — E034 Atrophy of thyroid (acquired): Secondary | ICD-10-CM

## 2023-06-18 DIAGNOSIS — E7800 Pure hypercholesterolemia, unspecified: Secondary | ICD-10-CM

## 2023-06-21 NOTE — Telephone Encounter (Signed)
 Requested Prescriptions  Pending Prescriptions Disp Refills   SYNTHROID  137 MCG tablet [Pharmacy Med Name: SYNTHROID  137 MCG TABLET] 90 tablet 0    Sig: TAKE 1 TABLET BY MOUTH EVERY DAY BEFORE BREAKFAST     Endocrinology:  Hypothyroid Agents Passed - 06/21/2023  9:47 AM      Passed - TSH in normal range and within 360 days    TSH  Date Value Ref Range Status  04/21/2023 1.880 0.450 - 4.500 uIU/mL Final  07/04/2021 1.360 0.450 - 4.500 uIU/mL Final         Passed - Valid encounter within last 12 months    Recent Outpatient Visits           2 months ago Impacted cerumen of right ear   [facility] Pardue, Asencion Blacksmith, DO       Future Appointments             In 3 months Pardue, Asencion Blacksmith, DO [place] [facility]             rosuvastatin  (CRESTOR ) 5 MG tablet [Pharmacy Med Name: ROSUVASTATIN  CALCIUM  5 MG TAB] 90 tablet 1    Sig: TAKE 1 TABLET (5 MG TOTAL) BY MOUTH DAILY.     Cardiovascular:  Antilipid - Statins 2 Failed - 06/21/2023  9:47 AM      Failed - Lipid Panel in normal range within the last 12 months    Cholesterol, Total  Date Value Ref Range Status  04/21/2023 168 100 - 199 mg/dL Final   LDL Cholesterol (Calc)  Date Value Ref Range Status  02/18/2017 85 mg/dL (calc) Final    Comment:    Reference range: <100 . Desirable range <100 mg/dL for primary prevention;   <70 mg/dL for patients with CHD or diabetic patients  with > or = 2 CHD risk factors. Aaron Aas LDL-C is now calculated using the Martin-Hopkins  calculation, which is a validated novel method providing  better accuracy than the Friedewald equation in the  estimation of LDL-C.  Melinda Sprawls et al. Erroll Heard. 9147;829(56): 2061-2068  (http://education.[employer].com/faq/FAQ164)    LDL Chol Calc (NIH)  Date Value Ref Range Status  04/21/2023 65 0 - 99 mg/dL Final   HDL  Date Value Ref Range Status  04/21/2023 78 >39 mg/dL Final   Triglycerides  Date Value  Ref Range Status  04/21/2023 149 0 - 149 mg/dL Final         Passed - Cr in normal range and within 360 days    Creat  Date Value Ref Range Status  02/18/2017 0.85 0.60 - 0.93 mg/dL Final    Comment:    For patients >50 years of age, the reference limit for Creatinine is approximately 13% higher for people identified as African-American. .    Creatinine, Ser  Date Value Ref Range Status  04/21/2023 0.79 0.57 - 1.00 mg/dL Final         Passed - Patient is not pregnant      Passed - Valid encounter within last 12 months    Recent Outpatient Visits           2 months ago Impacted cerumen of right ear   [facility] Pardue, Asencion Blacksmith, DO       Future Appointments             In 3 months Pardue, Asencion Blacksmith, DO [place] [facility], [facility]

## 2023-07-02 DIAGNOSIS — D2271 Melanocytic nevi of right lower limb, including hip: Secondary | ICD-10-CM | POA: Diagnosis not present

## 2023-07-02 DIAGNOSIS — L820 Inflamed seborrheic keratosis: Secondary | ICD-10-CM | POA: Diagnosis not present

## 2023-07-02 DIAGNOSIS — D225 Melanocytic nevi of trunk: Secondary | ICD-10-CM | POA: Diagnosis not present

## 2023-07-02 DIAGNOSIS — D2272 Melanocytic nevi of left lower limb, including hip: Secondary | ICD-10-CM | POA: Diagnosis not present

## 2023-07-02 DIAGNOSIS — L821 Other seborrheic keratosis: Secondary | ICD-10-CM | POA: Diagnosis not present

## 2023-07-02 DIAGNOSIS — Z85828 Personal history of other malignant neoplasm of skin: Secondary | ICD-10-CM | POA: Diagnosis not present

## 2023-07-02 DIAGNOSIS — L538 Other specified erythematous conditions: Secondary | ICD-10-CM | POA: Diagnosis not present

## 2023-08-12 DIAGNOSIS — M180 Bilateral primary osteoarthritis of first carpometacarpal joints: Secondary | ICD-10-CM | POA: Diagnosis not present

## 2023-08-12 DIAGNOSIS — M1812 Unilateral primary osteoarthritis of first carpometacarpal joint, left hand: Secondary | ICD-10-CM | POA: Diagnosis not present

## 2023-08-12 DIAGNOSIS — M1811 Unilateral primary osteoarthritis of first carpometacarpal joint, right hand: Secondary | ICD-10-CM | POA: Diagnosis not present

## 2023-08-17 ENCOUNTER — Telehealth: Payer: Self-pay | Admitting: Family Medicine

## 2023-08-17 DIAGNOSIS — R350 Frequency of micturition: Secondary | ICD-10-CM | POA: Diagnosis not present

## 2023-08-17 DIAGNOSIS — N390 Urinary tract infection, site not specified: Secondary | ICD-10-CM | POA: Diagnosis not present

## 2023-08-17 NOTE — Telephone Encounter (Signed)
 Noted

## 2023-08-17 NOTE — Telephone Encounter (Signed)
  Declined triage; apt. Scheduled for this Thursday.    Copied from CRM [id number]. Topic: Clinical - Medical Advice >> Aug 17, 2023  9:14 AM Gustabo D wrote: Having a problem with urine says she she can't hold it anymore. Says her husband passed in January and since then her muscle mass hasn't been the same. She wants to know if she be given.

## 2023-08-19 ENCOUNTER — Ambulatory Visit: Admitting: Physician Assistant

## 2023-08-27 ENCOUNTER — Other Ambulatory Visit: Payer: Self-pay | Admitting: Family Medicine

## 2023-08-27 DIAGNOSIS — F411 Generalized anxiety disorder: Secondary | ICD-10-CM

## 2023-08-27 DIAGNOSIS — N959 Unspecified menopausal and perimenopausal disorder: Secondary | ICD-10-CM

## 2023-09-15 DIAGNOSIS — H353231 Exudative age-related macular degeneration, bilateral, with active choroidal neovascularization: Secondary | ICD-10-CM | POA: Diagnosis not present

## 2023-09-21 ENCOUNTER — Ambulatory Visit: Payer: PPO

## 2023-09-21 DIAGNOSIS — Z0000 Encounter for general adult medical examination without abnormal findings: Secondary | ICD-10-CM | POA: Diagnosis not present

## 2023-09-21 NOTE — Progress Notes (Signed)
 Subjective:   Jamie [name] is a 83 y.o. who presents for a Medicare Wellness preventive visit.  As a reminder, Annual Wellness Visits don't include a physical exam, and some assessments may be limited, especially if this visit is performed virtually. We may recommend an in-person follow-up visit with your provider if needed.  Visit Complete: Virtual I connected with  EAVAN GONTERMAN on 09/21/23 by a audio enabled telemedicine application and verified that I am speaking with the correct person using two identifiers.  Patient Location: Home  Provider Location: Office/Clinic  I discussed the limitations of evaluation and management by telemedicine. The patient expressed understanding and agreed to proceed.  Vital Signs: Because this visit was a virtual/telehealth visit, some criteria may be missing or patient reported. Any vitals not documented were not able to be obtained and vitals that have been documented are patient reported.  VideoDeclined- This patient declined Librarian, academic. Therefore the visit was completed with audio only.  Persons Participating in Visit: Patient.  AWV Questionnaire: No: Patient Medicare AWV questionnaire was not completed prior to this visit.  Cardiac Risk Factors include: advanced age (>42men, >46 women);dyslipidemia     Objective:    There were no vitals filed for this visit. There is no height or weight on file to calculate BMI.     09/21/2023    2:39 PM 02/11/2022    6:35 AM 09/27/2019    2:03 PM 09/11/2019    9:27 AM 08/31/2019   12:24 PM 01/27/2018   10:55 AM 01/13/2017    9:22 AM  Advanced Directives  Does Patient Have a Medical Advance Directive? No No Yes Yes Yes Yes  Yes   Type of Surveyor, minerals;Living will Healthcare Power of [place];Living will Healthcare Power of [place];Living will Healthcare Power of [place];Living will Healthcare Power of [place];Living will  Does  patient want to make changes to medical advance directive?    No - Patient declined No - Patient declined    Copy of Healthcare Power of Attorney in Chart?   No - copy requested No - copy requested No - copy requested No - copy requested  No - copy requested   Would patient like information on creating a medical advance directive? No - Patient declined No - Patient declined          Data saved with a previous flowsheet row definition    Current Medications (verified) Outpatient Encounter Medications as of 09/21/2023  Medication Sig   Acetaminophen -Caffeine (TENSION HEADACHE) 500-65 MG TABS Take 2 tablets by mouth daily as needed (headaches).   ALPRAZolam  (XANAX ) 0.25 MG tablet Take 0.5 tablets (0.125 mg total) by mouth at bedtime as needed for anxiety.   b complex vitamins tablet Take 1 tablet by mouth daily.   Cholecalciferol (VITAMIN D ) 50 MCG (2000 UT) tablet Take 2,000 Units by mouth daily.   esomeprazole (NEXIUM) 20 MG capsule Take 20 mg by mouth daily.   estradiol  (ESTRACE ) 0.5 MG tablet TAKE 1 TABLET BY MOUTH EVERY DAY   meloxicam  (MOBIC ) 7.5 MG tablet TAKE 1 TABLET BY MOUTH EVERY DAY AS NEEDED   MULTIPLE VITAMIN PO Take 1 tablet by mouth daily.    Multiple Vitamins-Minerals (PRESERVISION AREDS 2 PO) Take 1 tablet by mouth 2 (two) times daily.   nortriptyline  (PAMELOR ) 50 MG capsule TAKE 1 CAPSULE BY MOUTH EVERYDAY AT BEDTIME   rosuvastatin  (CRESTOR ) 5 MG tablet TAKE 1 TABLET (5 MG TOTAL) BY  MOUTH DAILY.   SYNTHROID  137 MCG tablet TAKE 1 TABLET BY MOUTH EVERY DAY BEFORE BREAKFAST   vitamin E 1000 UNIT capsule Take 1,000 Units by mouth daily.   cetirizine (ZYRTEC) 10 MG tablet Take 10 mg by mouth daily. As needed (Patient not taking: Reported on 09/21/2023)   furosemide  (LASIX ) 20 MG tablet TAKE 1 TABLET BY MOUTH EVERY DAY (Patient not taking: Reported on 09/21/2023)   tiZANidine  (ZANAFLEX ) 4 MG tablet TAKE 0.5-1 TABLETS (2-4 MG TOTAL) BY MOUTH EVERY 6 (SIX) HOURS AS NEEDED FOR MUSCLE  SPASMS. (Patient not taking: Reported on 09/21/2023)   No facility-administered encounter medications on file as of 09/21/2023.    Allergies (verified) Codeine, Faricimab , Penicillin v potassium, and Penicillins   History: Past Medical History:  Diagnosis Date   Anemia    Anxiety    Arthritis    GERD (gastroesophageal reflux disease)    Headache    Hypertension    patient denies   Hypothyroidism    Macular degeneration    Ovarian cyst, left 2021   PONV (postoperative nausea and vomiting)    Past Surgical History:  Procedure Laterality Date   ABDOMINAL HYSTERECTOMY  1980   APPENDECTOMY     CATARACT EXTRACTION Bilateral 2014   CHOLECYSTECTOMY     COLONOSCOPY WITH PROPOFOL  N/A 03/22/2015   Procedure: COLONOSCOPY WITH PROPOFOL ;  Surgeon: Lamar ONEIDA Holmes, MD;  Location: [facility] ENDOSCOPY;  Service: Endoscopy;  Laterality: N/A;   EYE SURGERY     LAPAROSCOPIC BILATERAL SALPINGO OOPHERECTOMY Bilateral 09/11/2019   Procedure: LAPAROSCOPIC BILATERAL SALPINGO OOPHORECTOMY;  Surgeon: Schermerhorn, Debby PARAS, MD;  Location: [facility] ORS;  Service: Gynecology;  Laterality: Bilateral;   TUBAL LIGATION     Family History  Problem Relation Age of Onset   Hyperlipidemia Mother    Heart attack Father    Emphysema Father    Healthy Sister    Diabetes Brother    Lymphoma Brother    Stroke Maternal Grandmother    Breast cancer Neg Hx    Social History   Socioeconomic History   Marital status: Married    Spouse name: Not on file   Number of children: 2   Years of education: Not on file   Highest education level: 12th grade  Occupational History   Occupation: retired  Tobacco Use   Smoking status: Never   Smokeless tobacco: Never  Vaping Use   Vaping status: Never Used  Substance and Sexual Activity   Alcohol use: No   Drug use: No   Sexual activity: Not on file  Other Topics Concern   Not on file  Social History Narrative   Not on file   Social Drivers of Health   Financial  Resource Strain: Low Risk  (09/21/2023)   Overall Financial Resource Strain (CARDIA)    Difficulty of Paying Living Expenses: Not hard at all  Food Insecurity: No Food Insecurity (09/21/2023)   Hunger Vital Sign    Worried About Running Out of Food in the Last Year: Never true    Ran Out of Food in the Last Year: Never true  Transportation Needs: No Transportation Needs (09/21/2023)   PRAPARE - Administrator, Civil Service (Medical): No    Lack of Transportation (Non-Medical): No  Physical Activity: Sufficiently Active (09/21/2023)   Exercise Vital Sign    Days of Exercise per Week: 5 days    Minutes of Exercise per Session: 40 min  Stress: No Stress Concern Present (09/21/2023)   [place]  [employer] - Occupational Stress Questionnaire    Feeling of Stress: Not at all  Social Connections: Moderately Isolated (09/21/2023)   Social Connection and Isolation Panel    Frequency of Communication with Friends and Family: More than three times a week    Frequency of Social Gatherings with Friends and Family: Twice a week    Attends Religious Services: More than 4 times per year    Active Member of [employer] or Organizations: No    Attends Banker Meetings: Never    Marital Status: Widowed    Tobacco Counseling Counseling given: Not Answered    Clinical Intake:  Pre-visit preparation completed: Yes  Pain : No/denies pain     BMI - recorded: 21.3 Nutritional Status: BMI of 19-24  Normal Nutritional Risks: None Diabetes: No  Lab Results  Component Value Date   HGBA1C 5.6 02/18/2017     How often do you need to have someone help you when you read instructions, pamphlets, or other written materials from your doctor or pharmacy?: 1 - Never  Interpreter Needed?: No  Information entered by :: JHONNIE DAS, LPN   Activities of Daily Living     09/21/2023    2:41 PM  In your present state of health, do you have any difficulty performing  the following activities:  Hearing? 0  Vision? 0  Difficulty concentrating or making decisions? 0  Walking or climbing stairs? 0  Dressing or bathing? 0  Doing errands, shopping? 0  Preparing Food and eating ? N  Using the Toilet? N  In the past six months, have you accidently leaked urine? N  Do you have problems with loss of bowel control? N  Managing your Medications? N  Managing your Finances? N  Housekeeping or managing your Housekeeping? N    Patient Care Team: Pardue, Lauraine SAILOR, DO as PCP - General (Family Medicine) [facility], [facility]  I have updated your Care Teams any recent Medical Services you may have received from other providers in the past year.     Assessment:   This is a routine wellness examination for [place].  Hearing/Vision screen Hearing Screening - Comments:: NO AIDS Vision Screening - Comments:: WEARS GLASSES ALL DAY- DR.[email or web address]   Goals Addressed             This Visit's Progress    DIET - EAT MORE FRUITS AND VEGETABLES         Depression Screen     09/21/2023    2:38 PM 04/13/2023    1:00 PM 12/11/2022    3:56 PM 05/21/2022    2:43 PM 12/25/2021    1:26 PM 06/23/2021    2:44 PM 12/24/2020   10:11 AM  PHQ 2/9 Scores  PHQ - 2 Score 0 0 0 0 0 0 0  PHQ- 9 Score 0 1  0 0 0     Fall Risk     09/21/2023    2:41 PM 04/13/2023    1:00 PM 05/21/2022    2:43 PM 12/25/2021    1:25 PM 12/24/2020   10:19 AM  Fall Risk   Falls in the past year? 0 0 0 0 0  Number falls in past yr: 0 0 0 0 0  Injury with Fall? 0 0  0 0  Risk for fall due to : No Fall Risks  No Fall Risks No Fall Risks No Fall Risks  Follow up Falls evaluation completed;Falls prevention discussed  Falls evaluation completed       Data saved with a previous flowsheet row definition    MEDICARE RISK AT HOME:  Medicare Risk at Home Any stairs in or around the home?: Yes If so, are there any without handrails?: No Home free of loose throw rugs in walkways, pet beds, electrical  cords, etc?: Yes Adequate lighting in your home to reduce risk of falls?: Yes Life alert?: No Use of a cane, walker or w/c?: No Grab bars in the bathroom?: Yes Shower chair or bench in shower?: Yes Elevated toilet seat or a handicapped toilet?: Yes  TIMED UP AND GO:  Was the test performed?  No  Cognitive Function: 6CIT completed        09/21/2023    2:44 PM  6CIT Screen  What Year? 0 points  What month? 0 points  What time? 0 points  Count back from 20 0 points  Months in reverse 0 points  Repeat phrase 0 points  Total Score 0 points    Immunizations Immunization History  Administered Date(s) Administered   Fluad Quad(high Dose 65+) 12/24/2020   Influenza, High Dose Seasonal PF 01/03/2015, 12/12/2015, 12/03/2016, 12/15/2017   Influenza-Unspecified 10/24/2013, 11/11/2018, 12/09/2021   PFIZER(Purple Top)SARS-COV-2 Vaccination 04/26/2019, 05/17/2019   Pneumococcal Conjugate-13 01/10/2014   Pneumococcal Polysaccharide-23 05/15/2010    Screening Tests Health Maintenance  Topic Date Due   Zoster Vaccines- Shingrix (1 of 2) Never done   DEXA SCAN  03/11/2022   COVID-19 Vaccine (3 - Pfizer risk series) 10/25/2023 (Originally 06/14/2019)   DTaP/Tdap/Td (1 - Tdap) 04/12/2024 (Originally 01/17/1960)   INFLUENZA VACCINE  09/24/2023   MAMMOGRAM  05/20/2024   Medicare Annual Wellness (AWV)  09/20/2024   Pneumococcal Vaccine: 50+ Years  Completed   Hepatitis B Vaccines  Aged Out   HPV VACCINES  Aged Out   Meningococcal B Vaccine  Aged Out    Health Maintenance  Health Maintenance Due  Topic Date Due   Zoster Vaccines- Shingrix (1 of 2) Never done   DEXA SCAN  03/11/2022   Health Maintenance Items Addressed: MAMMOGRAM UP TO DATE; AGED OUT OF COLONOSCOPY; BDS HAS BEEN ORDERED; NEEDS PNA, SHINGRIX; WANTS NO MORE COVIDS  Additional Screening:  Vision Screening: Recommended annual ophthalmology exams for early detection of glaucoma and other disorders of the eye. Would  you like a referral to an eye doctor? No    Dental Screening: Recommended annual dental exams for proper oral hygiene  Community Resource Referral / Chronic Care Management: CRR required this visit?  No   CCM required this visit?  No   Plan:    I have personally reviewed and noted the following in the patient's chart:   Medical and social history Use of alcohol, tobacco or illicit drugs  Current medications and supplements including opioid prescriptions. Patient is not currently taking opioid prescriptions. Functional ability and status Nutritional status Physical activity Advanced directives List of other physicians Hospitalizations, surgeries, and ER visits in previous 12 months Vitals Screenings to include cognitive, depression, and falls Referrals and appointments  In addition, I have reviewed and discussed with patient certain preventive protocols, quality metrics, and best practice recommendations. A written personalized care plan for preventive services as well as general preventive health recommendations were provided to patient.   Jhonnie GORMAN Das, LPN   [id number]/[id number]   After Visit Summary: (MyChart) Due to this being a telephonic visit, the after visit summary with patients personalized plan was offered to patient via MyChart  Notes: Nothing significant to report at this time.

## 2023-09-21 NOTE — Patient Instructions (Addendum)
 Jamie [name] , Thank you for taking time out of your busy schedule to complete your Annual Wellness Visit with me. I enjoyed our conversation and look forward to speaking with you again next year. I, as well as your care team,  appreciate your ongoing commitment to your health goals. Please review the following plan we discussed and let me know if I can assist you in the future.   Follow up Visits: Next Medicare AWV with our clinical staff:   09/26/24 @ 2:30 PM BY PHONE Have you seen your provider in the last 6 months (3 months if uncontrolled diabetes)? Yes  Clinician Recommendations:  Aim for 30 minutes of exercise or brisk walking, 6-8 glasses of water, and 5 servings of fruits and vegetables each day. TAKE CARE!      This is a list of the screening recommended for you and due dates:  Health Maintenance  Topic Date Due   Zoster (Shingles) Vaccine (1 of 2) Never done   DEXA scan (bone density measurement)  03/11/2022   COVID-19 Vaccine (3 - Pfizer risk series) 10/25/2023*   DTaP/Tdap/Td vaccine (1 - Tdap) 04/12/2024*   Flu Shot  09/24/2023   Mammogram  05/20/2024   Medicare Annual Wellness Visit  09/20/2024   Pneumococcal Vaccine for age over 32  Completed   Hepatitis B Vaccine  Aged Out   HPV Vaccine  Aged Out   Meningitis B Vaccine  Aged Out  *Topic was postponed. The date shown is not the original due date.    Advanced directives: (ACP Link)Information on Advanced Care Planning can be found at [place]  Secretary of [facility] Advance Health Care Directives Advance Health Care Directives. [email or web address]  Advance Care Planning is important because it:  [x]  Makes sure you receive the medical care that is consistent with your values, goals, and preferences  [x]  It provides guidance to your family and loved ones and reduces their decisional burden about whether or not they are making the right decisions based on your wishes.  Follow the link provided in your after visit summary or read over  the paperwork we have mailed to you to help you started getting your Advance Directives in place. If you need assistance in completing these, please reach out to us  so that we can help you!

## 2023-09-22 ENCOUNTER — Ambulatory Visit: Payer: PPO | Admitting: Family Medicine

## 2023-09-22 ENCOUNTER — Encounter: Payer: Self-pay | Admitting: Family Medicine

## 2023-09-22 VITALS — BP 127/53 | HR 68 | Ht 66.0 in | Wt 133.9 lb

## 2023-09-22 DIAGNOSIS — K219 Gastro-esophageal reflux disease without esophagitis: Secondary | ICD-10-CM

## 2023-09-22 DIAGNOSIS — M8589 Other specified disorders of bone density and structure, multiple sites: Secondary | ICD-10-CM

## 2023-09-22 DIAGNOSIS — E7800 Pure hypercholesterolemia, unspecified: Secondary | ICD-10-CM

## 2023-09-22 DIAGNOSIS — R2689 Other abnormalities of gait and mobility: Secondary | ICD-10-CM | POA: Diagnosis not present

## 2023-09-22 DIAGNOSIS — F411 Generalized anxiety disorder: Secondary | ICD-10-CM | POA: Diagnosis not present

## 2023-09-22 DIAGNOSIS — E034 Atrophy of thyroid (acquired): Secondary | ICD-10-CM | POA: Diagnosis not present

## 2023-09-22 DIAGNOSIS — Z0000 Encounter for general adult medical examination without abnormal findings: Secondary | ICD-10-CM

## 2023-09-22 MED ORDER — ALPRAZOLAM 0.25 MG PO TABS: 0.1250 mg | ORAL_TABLET | Freq: Every evening | ORAL | 2 refills | Status: AC | PRN

## 2023-09-22 NOTE — Progress Notes (Signed)
 Established patient visit   Patient: Jamie [name]   DOB: 14-Oct-1940   83 y.o. Female  MRN: [id number] Visit Date: 09/22/2023  Today's healthcare provider: LAURAINE LOISE BUOY, DO   Chief Complaint  Patient presents with   Medical Management of Chronic Issues    Pt reports taking medications as prescribed   Hyperlipidemia    No symptoms to report   Hypothyroidism    No symptoms to report   Anxiety   Subjective    HPI Last annual: 12/11/2022  Jamie [name] is an 83 year old female who presents with anxiety and balance issues.  Her anxiety has improved with the use of alprazolam , which she takes as half a tablet at night to aid with sleep. However, reducing the dose to a quarter tablet results in diminished sleep quality. She has been on this regimen since 2023/05/06, following the death of her husband in 04-05-2023.  She is concerned about her balance, feeling wobbly, especially when slowing down or turning at home. This issue has become more noticeable since her husband's passing, as she is less active now. She walks daily at the mall but feels unsteady when not moving quickly. No dizziness is reported, but she feels her legs and core are weak.  Her current medications include alprazolam , rosuvastatin , Nexium, vitamin D , a B complex vitamin, and meloxicam  as needed. She also uses topical treatments like Biofreeze and capsaicin cream for pain management. She has previously received cortisone injections for pain relief.  She has a supportive social network, including two daughters, one of whom has experienced similar grief, and a church community. Her neighbors also check on her regularly.       Medications: Outpatient Medications Prior to Visit  Medication Sig   Acetaminophen -Caffeine (TENSION HEADACHE) 500-65 MG TABS Take 2 tablets by mouth daily as needed (headaches).   b complex vitamins tablet Take 1 tablet by mouth daily.   Cholecalciferol (VITAMIN D ) 50 MCG  (2000 UT) tablet Take 2,000 Units by mouth daily.   esomeprazole (NEXIUM) 20 MG capsule Take 20 mg by mouth daily.   estradiol  (ESTRACE ) 0.5 MG tablet TAKE 1 TABLET BY MOUTH EVERY DAY   meloxicam  (MOBIC ) 7.5 MG tablet TAKE 1 TABLET BY MOUTH EVERY DAY AS NEEDED   MULTIPLE VITAMIN PO Take 1 tablet by mouth daily.    Multiple Vitamins-Minerals (PRESERVISION AREDS 2 PO) Take 1 tablet by mouth 2 (two) times daily.   nortriptyline  (PAMELOR ) 50 MG capsule TAKE 1 CAPSULE BY MOUTH EVERYDAY AT BEDTIME   rosuvastatin  (CRESTOR ) 5 MG tablet TAKE 1 TABLET (5 MG TOTAL) BY MOUTH DAILY.   SYNTHROID  137 MCG tablet TAKE 1 TABLET BY MOUTH EVERY DAY BEFORE BREAKFAST   vitamin E 1000 UNIT capsule Take 1,000 Units by mouth daily.   [DISCONTINUED] ALPRAZolam  (XANAX ) 0.25 MG tablet Take 0.5 tablets (0.125 mg total) by mouth at bedtime as needed for anxiety.   cetirizine (ZYRTEC) 10 MG tablet Take 10 mg by mouth daily. As needed (Patient not taking: Reported on 09/22/2023)   [DISCONTINUED] furosemide  (LASIX ) 20 MG tablet TAKE 1 TABLET BY MOUTH EVERY DAY (Patient not taking: Reported on 09/22/2023)   [DISCONTINUED] tiZANidine  (ZANAFLEX ) 4 MG tablet TAKE 0.5-1 TABLETS (2-4 MG TOTAL) BY MOUTH EVERY 6 (SIX) HOURS AS NEEDED FOR MUSCLE SPASMS. (Patient not taking: Reported on 09/22/2023)   No facility-administered medications prior to visit.        Objective    BP (!) 127/53 (BP  Location: Left Arm, Patient Position: Sitting, Cuff Size: Normal)   Pulse 68   Ht 5' 6 (1.676 m)   Wt 133 lb 14.4 oz (60.7 kg)   SpO2 100%   BMI 21.61 kg/m     Physical Exam Vitals and nursing note reviewed.  Constitutional:      General: She is not in acute distress.    Appearance: Normal appearance.  HENT:     Head: Normocephalic and atraumatic.  Eyes:     General: No scleral icterus.    Conjunctiva/sclera: Conjunctivae normal.  Cardiovascular:     Rate and Rhythm: Normal rate.  Pulmonary:     Effort: Pulmonary effort is normal.   Neurological:     Mental Status: She is alert and oriented to person, place, and time. Mental status is at baseline.  Psychiatric:        Mood and Affect: Mood normal.        Behavior: Behavior normal.      No results found for any visits on 09/22/23.  Assessment & Plan    Balance problem -     Ambulatory referral to Physical Therapy  Hypothyroidism due to acquired atrophy of thyroid  -     DG Bone Density; Future  Hypercholesteremia  Generalized anxiety disorder -     ALPRAZolam ; Take 0.5 tablets (0.125 mg total) by mouth at bedtime as needed for anxiety.  Dispense: 30 tablet; Refill: 2  Osteopenia of multiple sites -     DG Bone Density; Future  Gastro-esophageal reflux disease without esophagitis     General Health maintenance Considering shingles vaccine, due for TDAP vaccine, completed pneumonia vaccine series, takes vitamin D  but not calcium . - Recommend TDAP vaccine at the pharmacy. - Discuss shingles vaccine and provide information for decision-making.  Unsteady gait and balance impairment Suspected muscle weakness due to decreased activity. - Refer to physical therapy to strengthen muscles and improve balance.  Chronic pain, unspecified site Experiences pain in an unspecified area, uses meloxicam  and topical treatments for relief.  Generalized anxiety disorder Improvement noted, continues alprazolam  at night for sleep, attempted dose reduction resulted in poor sleep. - Refill alprazolam  prescription.  Osteopenia No bone density test since 2019, plans to reassess bone density. - Order bone density test.  Hypothyroidism Known condition, plans to assess thyroid  function in October.  Hypercholesterolemia On rosuvastatin , reports very good cholesterol levels.  Gastroesophageal reflux disease (GERD) Manages with daily over-the-counter Nexium.     Return in about 3 months (around 12/13/2023) for CPE.      I discussed the assessment and treatment  plan with the patient  The patient was provided an opportunity to ask questions and all were answered. The patient agreed with the plan and demonstrated an understanding of the instructions.   The patient was advised to call back or seek an in-person evaluation if the symptoms worsen or if the condition fails to improve as anticipated.    LAURAINE LOISE BUOY, DO  [facility] [phone] (phone) [phone] (fax)  [facility]

## 2023-09-22 NOTE — Patient Instructions (Addendum)
 Tdap - tetanus, diphtheria and pertussis (whooping cough)) and Shingrix (shingles), plus update pneumonia if desired (Prevnar-20 or -21).  Please call the [facility] [phone]) to schedule a routine screening bone density test.

## 2023-10-12 DIAGNOSIS — H6123 Impacted cerumen, bilateral: Secondary | ICD-10-CM | POA: Diagnosis not present

## 2023-10-12 DIAGNOSIS — H903 Sensorineural hearing loss, bilateral: Secondary | ICD-10-CM | POA: Diagnosis not present

## 2023-10-12 DIAGNOSIS — J309 Allergic rhinitis, unspecified: Secondary | ICD-10-CM | POA: Diagnosis not present

## 2023-10-19 ENCOUNTER — Ambulatory Visit: Attending: Family Medicine | Admitting: Physical Therapy

## 2023-10-19 DIAGNOSIS — R2689 Other abnormalities of gait and mobility: Secondary | ICD-10-CM | POA: Diagnosis not present

## 2023-10-19 DIAGNOSIS — M6281 Muscle weakness (generalized): Secondary | ICD-10-CM | POA: Diagnosis not present

## 2023-10-19 DIAGNOSIS — R262 Difficulty in walking, not elsewhere classified: Secondary | ICD-10-CM | POA: Diagnosis not present

## 2023-10-19 NOTE — Therapy (Signed)
 OUTPATIENT PHYSICAL THERAPY BALANCE EVALUATION   Patient Name: Jamie [name] MRN: [id number] DOB:01-05-1941, 83 y.o., female Today's Date: 10/19/2023   PT End of Session - 10/19/23 1001     Visit Number 1    Number of Visits 17    Date for PT Re-Evaluation 12/16/23    Authorization Type HTA [id number]    PT Start Time 0948    PT Stop Time 1032    PT Time Calculation (min) 44 min    Equipment Utilized During Treatment Gait belt    Activity Tolerance Patient tolerated treatment well    Behavior During Therapy WFL for tasks assessed/performed          Past Medical History:  Diagnosis Date   Anemia    Anxiety    Arthritis    Blood transfusion without reported diagnosis    Cataract 2014   GERD (gastroesophageal reflux disease)    Headache    Hypertension    patient denies   Hypothyroidism    Macular degeneration    Ovarian cyst, left 2021   PONV (postoperative nausea and vomiting)    Past Surgical History:  Procedure Laterality Date   ABDOMINAL HYSTERECTOMY  1980   APPENDECTOMY     CATARACT EXTRACTION Bilateral 2014   CHOLECYSTECTOMY     COLONOSCOPY WITH PROPOFOL  N/A 03/22/2015   Procedure: COLONOSCOPY WITH PROPOFOL ;  Surgeon: Lamar ONEIDA Holmes, MD;  Location: [facility] ENDOSCOPY;  Service: Endoscopy;  Laterality: N/A;   EYE SURGERY     FRACTURE SURGERY  1989   LAPAROSCOPIC BILATERAL SALPINGO OOPHERECTOMY Bilateral 09/11/2019   Procedure: LAPAROSCOPIC BILATERAL SALPINGO OOPHORECTOMY;  Surgeon: Schermerhorn, Debby PARAS, MD;  Location: [facility] ORS;  Service: Gynecology;  Laterality: Bilateral;   TUBAL LIGATION     Patient Active Problem List   Diagnosis Date Noted   Impacted cerumen of right ear 04/13/2023   Chronic pain of multiple joints 04/13/2023   High risk medication use 12/11/2022   Annual physical exam 12/11/2022   Osteopenia 05/22/2022   Hormone replacement therapy 05/22/2022   Pain in limb 03/13/2022   Macular degeneration 12/24/2020   Cyst of ovary 05/03/2015    Foot pain 11/22/2014   Hypercholesteremia 11/20/2014   Cardiac murmur 11/20/2014   Temporary cerebral vascular dysfunction 11/20/2014   Gout attack 11/20/2014   Shortness of breath 11/20/2014   L-S radiculopathy 09/27/2014   Migraine 07/31/2014   Hypothyroidism 07/31/2014   Colon polyp 12/24/2009   Low back pain 06/28/2009   Anxiety state 10/26/2008   Esophageal stenosis 10/26/2008   Hemorrhoids, internal 10/26/2008   Menopausal and perimenopausal disorder 10/26/2008   Gastro-esophageal reflux disease without esophagitis 10/26/2008   Edema 10/26/2008   Generalized anxiety disorder 10/26/2008   Allergic rhinitis 07/13/2006    PCP: Lauraine LOISE Buoy, DO  REFERRING PROVIDER: Lauraine LOISE Buoy, DO  REFERRING DIAGNOSIS:  R26.89 (ICD-10-CM) - Balance problem    THERAPY DIAG: Imbalance  Difficulty in walking, not elsewhere classified  Muscle weakness (generalized)  ONSET DATE: January 2025  FOLLOW UP APPT WITH PROVIDER: Yes ; next physical with Dr. Buoy in October    RATIONALE FOR EVALUATION AND TREATMENT: Rehabilitation  SUBJECTIVE:  Chief Complaint: Pt is an 83 year old female referred for unsteady gait and balance impairment - PCP believes it is due to decreased activity.   Pertinent History Pt is an 83 year old female referred for unsteady gait and balance impairment - PCP believes it is due to decreased activity. Pt's husband passed away in March 18, 2023. She states that since he passed, she has had more weakness with not being as active. Pt reports no falls, but she intermittently runs into items by mistake. Patient reports remote Hx of sciatic pain that has improved; episodic pain that she is not experiencing presently. Pt has intermittent chiropractic intervention for back pain that helps. Pt reports  some stumbling with going up/down steps. Hx of hypothyroidism, anxiety, anemia. Pt repots walking up to 1.5 miles per day. She reports imbalance more with small steps and smaller movements with her feet.    Pain: No Numbness/Tingling: No Focal Weakness: Yes; LLE weakness Recent changes in overall health/medication: No; major life change with passing of her husband  Prior history of physical therapy for balance:  No Falls: Has patient fallen in last 6 months? No, Number of falls: N/A Directional pattern for falls: Yes; lateral staggering/LOB  Imaging: No   Prior level of function: Independent Occupational demands: Retired - medical records at [employer]: Crossword puzzles  Red flags (bowel/bladder changes, saddle paresthesia, personal history of cancer, h/o spinal tumors, h/o compression fx, h/o abdominal aneurysm, abdominal pain, chills/fever, night sweats, nausea, vomiting, unrelenting pain): Negative  Precautions: None  Weight Bearing Restrictions: No  Living Environment Lives with: lives alone, one daughter lives close, one in [place] Lives in: House/apartment, one-level home, 2 steps to get in with handrail on L going up. Handrails in shower, non-slip floor, shower seat available.  Has following equipment at home: None   Patient Goals: LE strengthening, feel more confident with walking and going up/down steps     OBJECTIVE:   Patient Surveys  ABC: 38.1%  Cognition Patient is oriented to person, place, and time.  Recent memory is intact.  Remote memory is intact.  Attention span and concentration are intact.  Expressive speech is intact.  Patient's fund of knowledge is within normal limits for educational level.    Gross Musculoskeletal Assessment Tremor: None Bulk: Normal Tone: Normal   GAIT: Distance walked: 160 ft Assistive device utilized: None Level of assistance: SBA Comments: Slight lateral shift to R side and forward flexed posture throughout  gait cycle. Sound heel strike, but diminished step length/stride length. Mild dec arm swing   Posture: FHRS, increased thoracic and lumbar kyphosis; pt able to largely self-correct posture in standing   AROM  AROM (Normal range in degrees) AROM  10/19/2023      Ankle    Dorsiflexion (20) WNL WNL  Plantarflexion (50) WNL WNL  Inversion (35)    Eversion (15    (* = pain; Blank rows = not tested)   LE MMT:  MMT (out of 5) Right 10/19/2023 Left 10/19/2023  Hip flexion 4- 4-  Hip extension    Hip abduction (seated) 4+ 4+  Hip adduction (seated) 5 5  Hip internal rotation    Hip external rotation    Knee flexion 5 4+  Knee extension 5 4+  Ankle dorsiflexion 5 5  Ankle plantarflexion 4+ 4  Ankle inversion    Ankle eversion    (* = pain; Blank rows = not tested)   Sensation Deferred   Reflexes Deferred   Cranial Nerves Deferred  Coordination/Cerebellar Finger to Nose: WNL Heel to Shin: WNL Rapid alternating movements: WNL Finger Opposition: WNL Pronator Drift: Negative    Clinical Test of Sensory Interaction for Balance    (CTSIB):  CONDITION TIME STRATEGY SWAY  Eyes open, firm surface 30 seconds ankle   Eyes closed, firm surface 30 seconds ankle Increased anterior/posterior sway  Eyes open, foam surface 30 seconds ankle Increased fasciculations and sway  Eyes closed, foam surface 14 seconds Ankle, hip      FUNCTIONAL OUTCOME MEASURES   Results Comments  BERG Next visit/56   DGI Next visit/24   TUG 11.5 seconds Duration below fall risk cut-off  5TSTS 17 seconds Fall risk, in need of intervention  (Blank rows = not tested)     TODAY'S TREATMENT    Self-care/Home Management - for HEP establishment, discussion on appropriate exercise/activity modification, PT education  Discussed current condition, role of PT, home safety, and PT plan of care.  Reviewed baseline home exercises and provided handout for [employer] program (see Access Code);  tactile cueing and therapist demonstration utilized as needed for carryover of proper technique to HEP.      PATIENT EDUCATION:  Education details: see above for patient education details Person educated: Patient Education method: Explanation, Demonstration, and Handouts Education comprehension: verbalized understanding and returned demonstration   HOME EXERCISE PROGRAM: Access Code: [email or web address] URL: [email or web address] Date: 10/19/2023 Prepared by: Venetia Endo  Exercises - Sit to Stand Without Arm Support  - 2 x daily - 7 x weekly - 2 sets - 10 reps - Standing March with Counter Support  - 2 x daily - 7 x weekly - 2 sets - 10 reps - Heel Raises with Counter Support  - 2 x daily - 7 x weekly - 2 sets - 10 reps   ASSESSMENT:  CLINICAL IMPRESSION: Patient is a pleasant 83 y.o. female who was seen today for physical therapy evaluation and treatment for imbalance and intermittent gait disturbance. Pt had significant life change in January with passing of her husband, and she feels that she has become weaker and had more challenges with gait since then. Pt has negative neurological history and no neuropathy/gross sensory changes. Pt has some challenges with sensory integration with uneven surface/eyes closed. She has mild proximal weakness and functional strength deficit with sit to stand performance. We will further assess BERG and DGI next visit. Objective impairments include Abnormal gait, decreased balance, difficulty walking, decreased strength, and postural dysfunction. These impairments are limiting patient from meal prep, cleaning, laundry, shopping, and community activity. Personal factors including Age, Past/current experiences, Time since onset of injury/illness/exacerbation, and 3+ comorbidities: (osteopenia, OA, anemia, hypothyroidism, anxiety) are also affecting patient's functional outcome. Patient will benefit from skilled PT to address above impairments and  improve overall function.  REHAB POTENTIAL: Good  CLINICAL DECISION MAKING: Evolving/moderate complexity  EVALUATION COMPLEXITY: Moderate   GOALS: Goals reviewed with patient? Yes  SHORT TERM GOALS: Target date: 11/09/2023  Pt will be independent with HEP in order to improve strength and balance in order to decrease fall risk and improve function at home. Baseline: 10/19/23: Baseline home exercises given, to be updated over next 2-3 weeks. Goal status: INITIAL   LONG TERM GOALS: Target date: 12/16/2023  Pt will negotiate full flight of steps with unilateral handrail support with reciprocal pattern IND with no notable LOB or gait deviations. Baseline: 10/19/23: Perceived difficulty and concern with stair safety.  Goal status: INITIAL  2.  Pt will improve BERG by at least 3  points in order to demonstrate clinically significant improvement in balance.   Baseline: 10/19/23: To be completed on visit # 2. Goal status: INITIAL  3.  Pt will improve ABC by at least 13% in order to demonstrate clinically significant improvement in balance confidence.      Baseline: 10/19/23: 38.1% Goal status: INITIAL  4. Pt will decrease 5TSTS to less than 12 sec in order to demonstrate clinically significant improvement in LE strength and being superior to fall risk cut-off score Baseline: 10/19/23: 17 sec Goal status: INITIAL  5. Pt will improve DGI by at least 3 points in order to demonstrate clinically significant improvement in balance and decreased risk for falls.     Baseline: 10/19/23: To be completed on visit # 2. Goal status: INITIAL     PLAN: PT FREQUENCY: 2x/week  PT DURATION: 8 weeks  PLANNED INTERVENTIONS: Therapeutic exercises, Therapeutic activity, Neuromuscular re-education, Balance training, Gait training, Patient education, Self-care/Home management  PLAN FOR NEXT SESSION: BERG, DGI, further balance/fall risk testing as indicated. LE strengthening and postural control training.     Venetia Endo, PT, DPT #[id number]  Venetia ONEIDA Endo 10/19/2023, 10:02 AM

## 2023-10-20 ENCOUNTER — Other Ambulatory Visit: Payer: Self-pay | Admitting: Family Medicine

## 2023-10-20 DIAGNOSIS — E034 Atrophy of thyroid (acquired): Secondary | ICD-10-CM

## 2023-10-22 ENCOUNTER — Other Ambulatory Visit: Payer: Self-pay | Admitting: Family Medicine

## 2023-10-22 DIAGNOSIS — G8929 Other chronic pain: Secondary | ICD-10-CM

## 2023-10-22 NOTE — Telephone Encounter (Signed)
 LOV 09/22/23 NOV 10/30/ LRF 04/27/23 LABS 04/21/23

## 2023-10-27 ENCOUNTER — Ambulatory Visit: Attending: Family Medicine | Admitting: Physical Therapy

## 2023-10-27 ENCOUNTER — Encounter: Payer: Self-pay | Admitting: Physical Therapy

## 2023-10-27 DIAGNOSIS — R262 Difficulty in walking, not elsewhere classified: Secondary | ICD-10-CM | POA: Insufficient documentation

## 2023-10-27 DIAGNOSIS — R2689 Other abnormalities of gait and mobility: Secondary | ICD-10-CM | POA: Insufficient documentation

## 2023-10-27 DIAGNOSIS — M6281 Muscle weakness (generalized): Secondary | ICD-10-CM | POA: Insufficient documentation

## 2023-10-27 NOTE — Therapy (Unsigned)
 OUTPATIENT PHYSICAL THERAPY BALANCE TREATMENT   Patient Name: Jamie [name] MRN: [id number] DOB:March 19, 1940, 83 y.o., female Today's Date: 10/27/2023   PT End of Session - 10/27/23 1641     Visit Number 2    Number of Visits 17    Date for PT Re-Evaluation 12/16/23    Authorization Type HTA [id number]    PT Start Time 1635    PT Stop Time 1715    PT Time Calculation (min) 40 min    Equipment Utilized During Treatment Gait belt    Activity Tolerance Patient tolerated treatment well    Behavior During Therapy WFL for tasks assessed/performed           Past Medical History:  Diagnosis Date   Anemia    Anxiety    Arthritis    Blood transfusion without reported diagnosis    Cataract 2014   GERD (gastroesophageal reflux disease)    Headache    Hypertension    patient denies   Hypothyroidism    Macular degeneration    Ovarian cyst, left 2021   PONV (postoperative nausea and vomiting)    Past Surgical History:  Procedure Laterality Date   ABDOMINAL HYSTERECTOMY  1980   APPENDECTOMY     CATARACT EXTRACTION Bilateral 2014   CHOLECYSTECTOMY     COLONOSCOPY WITH PROPOFOL  N/A 03/22/2015   Procedure: COLONOSCOPY WITH PROPOFOL ;  Surgeon: Lamar ONEIDA Holmes, MD;  Location: [facility];  Service: Endoscopy;  Laterality: N/A;   EYE SURGERY     FRACTURE SURGERY  1989   LAPAROSCOPIC BILATERAL SALPINGO OOPHERECTOMY Bilateral 09/11/2019   Procedure: LAPAROSCOPIC BILATERAL SALPINGO OOPHORECTOMY;  Surgeon: Schermerhorn, Debby PARAS, MD;  Location: [facility];  Service: Gynecology;  Laterality: Bilateral;   TUBAL LIGATION     Patient Active Problem List   Diagnosis Date Noted   Impacted cerumen of right ear 04/13/2023   Chronic pain of multiple joints 04/13/2023   High risk medication use 12/11/2022   Annual physical exam 12/11/2022   Osteopenia 05/22/2022   Hormone replacement therapy 05/22/2022   Pain in limb 03/13/2022   Macular degeneration 12/24/2020   Cyst of ovary 05/03/2015    Foot pain 11/22/2014   Hypercholesteremia 11/20/2014   Cardiac murmur 11/20/2014   Temporary cerebral vascular dysfunction 11/20/2014   Gout attack 11/20/2014   Shortness of breath 11/20/2014   L-S radiculopathy 09/27/2014   Migraine 07/31/2014   Hypothyroidism 07/31/2014   Colon polyp 12/24/2009   Low back pain 06/28/2009   Anxiety state 10/26/2008   Esophageal stenosis 10/26/2008   Hemorrhoids, internal 10/26/2008   Menopausal and perimenopausal disorder 10/26/2008   Gastro-esophageal reflux disease without esophagitis 10/26/2008   Edema 10/26/2008   Generalized anxiety disorder 10/26/2008   Allergic rhinitis 07/13/2006    PCP: Lauraine LOISE Buoy, DO  REFERRING PROVIDER: Lauraine LOISE Buoy, DO  REFERRING DIAGNOSIS:  R26.89 (ICD-10-CM) - Balance problem    THERAPY DIAG: Imbalance  Difficulty in walking, not elsewhere classified  Muscle weakness (generalized)  ONSET DATE: January 2025  FOLLOW UP APPT WITH PROVIDER: Yes ; next physical with Dr. Buoy in October    RATIONALE FOR EVALUATION AND TREATMENT: Rehabilitation  SUBJECTIVE:  Chief Complaint: Pt is an 83 year old female referred for unsteady gait and balance impairment - PCP believes it is due to decreased activity.   Pertinent History Pt is an 83 year old female referred for unsteady gait and balance impairment - PCP believes it is due to decreased activity. Pt's husband passed away in 03-05-2023. She states that since he passed, she has had more weakness with not being as active. Pt reports no falls, but she intermittently runs into items by mistake. Patient reports remote Hx of sciatic pain that has improved; episodic pain that she is not experiencing presently. Pt has intermittent chiropractic intervention for back pain that helps. Pt reports  some stumbling with going up/down steps. Hx of hypothyroidism, anxiety, anemia. Pt repots walking up to 1.5 miles per day. She reports imbalance more with small steps and smaller movements with her feet.    Pain: No Numbness/Tingling: No Focal Weakness: Yes; LLE weakness Recent changes in overall health/medication: No; major life change with passing of her husband  Prior history of physical therapy for balance:  No Falls: Has patient fallen in last 6 months? No, Number of falls: N/A Directional pattern for falls: Yes; lateral staggering/LOB  Imaging: No   Prior level of function: Independent Occupational demands: Retired - medical records at [employer]: Crossword puzzles  Red flags (bowel/bladder changes, saddle paresthesia, personal history of cancer, h/o spinal tumors, h/o compression fx, h/o abdominal aneurysm, abdominal pain, chills/fever, night sweats, nausea, vomiting, unrelenting pain): Negative  Precautions: None  Weight Bearing Restrictions: No  Living Environment Lives with: lives alone, one daughter lives close, one in [place] Lives in: House/apartment, one-level home, 2 steps to get in with handrail on L going up. Handrails in shower, non-slip floor, shower seat available.  Has following equipment at home: None   Patient Goals: LE strengthening, feel more confident with walking and going up/down steps     OBJECTIVE:   Patient Surveys  ABC: 38.1%  Cognition Patient is oriented to person, place, and time.  Recent memory is intact.  Remote memory is intact.  Attention span and concentration are intact.  Expressive speech is intact.  Patient's fund of knowledge is within normal limits for educational level.    Gross Musculoskeletal Assessment Tremor: None Bulk: Normal Tone: Normal   GAIT: Distance walked: 160 ft Assistive device utilized: None Level of assistance: SBA Comments: Slight lateral shift to R side and forward flexed posture throughout  gait cycle. Sound heel strike, but diminished step length/stride length. Mild dec arm swing   Posture: FHRS, increased thoracic and lumbar kyphosis; pt able to largely self-correct posture in standing   AROM  AROM (Normal range in degrees) AROM  10/19/2023      Ankle    Dorsiflexion (20) WNL WNL  Plantarflexion (50) WNL WNL  Inversion (35)    Eversion (15    (* = pain; Blank rows = not tested)   LE MMT:  MMT (out of 5) Right 10/19/2023 Left 10/19/2023  Hip flexion 4- 4-  Hip extension    Hip abduction (seated) 4+ 4+  Hip adduction (seated) 5 5  Hip internal rotation    Hip external rotation    Knee flexion 5 4+  Knee extension 5 4+  Ankle dorsiflexion 5 5  Ankle plantarflexion 4+ 4  Ankle inversion    Ankle eversion    (* = pain; Blank rows = not tested)   Sensation Deferred   Reflexes Deferred   Cranial Nerves Deferred  Coordination/Cerebellar Finger to Nose: WNL Heel to Shin: WNL Rapid alternating movements: WNL Finger Opposition: WNL Pronator Drift: Negative    Clinical Test of Sensory Interaction for Balance    (CTSIB):  CONDITION TIME STRATEGY SWAY  Eyes open, firm surface 30 seconds ankle   Eyes closed, firm surface 30 seconds ankle Increased anterior/posterior sway  Eyes open, foam surface 30 seconds ankle Increased fasciculations and sway  Eyes closed, foam surface 14 seconds Ankle, hip      FUNCTIONAL OUTCOME MEASURES   Results Comments  BERG 48/56   DGI 19/24   TUG 11.5 seconds Duration below fall risk cut-off  5TSTS 17 seconds Fall risk, in need of intervention  (Blank rows = not tested)     TODAY'S TREATMENT    SUBJECTIVE STATEMENT:   Patient reports compliance with initial home exercises. Patient reports    Therapeutic Exercise - improved strength as needed to improve performance of CKC activities/functional movements and as needed for power production to prevent fall during episode of large postural  perturbation  NuStep; Level 3, x 5 minutes - for improved soft tissue mobility and increased tissue temperature to improve muscle performance   -subjective gathered during this time  PATIENT EDUCATION: Updated HEP and provided new [employer] handout.    Neuromuscular Re-education - for improved sensory integration, static and dynamic postural control, equilibrium and non-equilibrium coordination as needed for negotiating home and community environment and stepping over obstacles  BERG and DGI performance* (see below)   [facility] PT Assessment - 10/27/23 0001       Standardized Balance Assessment   Standardized Balance Assessment Berg Balance Test;Dynamic Gait Index      Berg Balance Test   Sit to Stand Able to stand without using hands and stabilize independently    Standing Unsupported Able to stand safely 2 minutes    Sitting with Back Unsupported but Feet Supported on Floor or Stool Able to sit safely and securely 2 minutes    Stand to Sit Sits safely with minimal use of hands    Transfers Able to transfer safely, minor use of hands    Standing Unsupported with Eyes Closed Able to stand 10 seconds safely    Standing Unsupported with Feet Together Able to place feet together independently and stand 1 minute safely    From Standing, Reach Forward with Outstretched Arm Can reach confidently >25 cm (10)    From Standing Position, Pick up Object from Floor Able to pick up shoe safely and easily    From Standing Position, Turn to Look Behind Over each Shoulder Looks behind from both sides and weight shifts well    Turn 360 Degrees Able to turn 360 degrees safely one side only in 4 seconds or less    Standing Unsupported, Alternately Place Feet on Step/Stool Able to complete >2 steps/needs minimal assist    Standing Unsupported, One Foot in Front Able to take small step independently and hold 30 seconds    Standing on One Leg Able to lift leg independently and hold equal to or more than 3  seconds    Total Score 48      Dynamic Gait Index   Level Surface Normal    Change in Gait Speed Normal    Gait with Horizontal Head Turns Moderate Impairment    Gait with Vertical Head Turns Mild Impairment    Gait and Pivot Turn Normal    Step Over Obstacle Mild Impairment    Step Around Obstacles Normal  Steps Mild Impairment    Total Score 19           Semitandem stance; 2 x 30 sec, bilat  Toe tapping, reviewed for HEP     PATIENT EDUCATION:  Education details: see above for patient education details Person educated: Patient Education method: Explanation, Demonstration, and Handouts Education comprehension: verbalized understanding and returned demonstration   HOME EXERCISE PROGRAM: Access Code: [email or web address] URL: [email or web address] Date: 10/27/2023 Prepared by: Venetia Endo  Exercises - Sit to Stand Without Arm Support  - 2 x daily - 7 x weekly - 2 sets - 10 reps - Standing March with Counter Support  - 2 x daily - 7 x weekly - 2 sets - 10 reps - Heel Raises with Counter Support  - 2 x daily - 7 x weekly - 2 sets - 10 reps - Forward Step Touch  - 2 x daily - 7 x weekly - 2 sets - 10 reps - Standing Romberg to 1/2 Tandem Stance  - 2 x daily - 7 x weekly - 3 sets - 20-30sec hold   ASSESSMENT:  CLINICAL IMPRESSION: Patient has BERG above fall risk cut-off score and DGI at fall risk cut-off score. Pt is most challenged with toe tapping on step, narrow BOS and unilateral standing, obstacle negotiation/stepping over, and gait with head turns. Her program was updated to include safe postural control training next to counter and forward toe touching on step/small box. Pt is improving in performance of sit to stand transfers early in POC with pt completing these regularly with HEP. Patient will benefit from skilled PT to address remaining deficits and improve overall function.  REHAB POTENTIAL: Good  CLINICAL DECISION MAKING: Evolving/moderate  complexity  EVALUATION COMPLEXITY: Moderate   GOALS: Goals reviewed with patient? Yes  SHORT TERM GOALS: Target date: 11/09/2023  Pt will be independent with HEP in order to improve strength and balance in order to decrease fall risk and improve function at home. Baseline: 10/19/23: Baseline home exercises given, to be updated over next 2-3 weeks. Goal status: INITIAL   LONG TERM GOALS: Target date: 12/16/2023  Pt will negotiate full flight of steps with unilateral handrail support with reciprocal pattern IND with no notable LOB or gait deviations. Baseline: 10/19/23: Perceived difficulty and concern with stair safety.  Goal status: INITIAL  2.  Pt will improve BERG by at least 3 points in order to demonstrate clinically significant improvement in balance.   Baseline: 10/19/23: To be completed on visit # 2.       10/27/23: 48/56 Goal status: INITIAL  3.  Pt will improve ABC by at least 13% in order to demonstrate clinically significant improvement in balance confidence.      Baseline: 10/19/23: 38.1% Goal status: INITIAL  4. Pt will decrease 5TSTS to less than 12 sec in order to demonstrate clinically significant improvement in LE strength and being superior to fall risk cut-off score Baseline: 10/19/23: 17 sec Goal status: INITIAL  5. Pt will improve DGI by at least 3 points in order to demonstrate clinically significant improvement in balance and decreased risk for falls.     Baseline: 10/19/23: To be completed on visit # 2.    10/27/23: 19/24 Goal status: INITIAL     PLAN: PT FREQUENCY: 2x/week  PT DURATION: 8 weeks  PLANNED INTERVENTIONS: Therapeutic exercises, Therapeutic activity, Neuromuscular re-education, Balance training, Gait training, Patient education, Self-care/Home management  PLAN FOR NEXT SESSION:  LE strengthening and postural control training. Exercises integrating head  turns and 90/180-degree changes in body position. Test VOR/head thrust as indicated.     Venetia Endo, PT, DPT #[id number]  Venetia ONEIDA Endo 10/27/2023, 5:15 PM

## 2023-11-02 ENCOUNTER — Encounter: Payer: Self-pay | Admitting: Physical Therapy

## 2023-11-02 ENCOUNTER — Ambulatory Visit: Admitting: Physical Therapy

## 2023-11-02 DIAGNOSIS — R2689 Other abnormalities of gait and mobility: Secondary | ICD-10-CM | POA: Diagnosis not present

## 2023-11-02 DIAGNOSIS — R262 Difficulty in walking, not elsewhere classified: Secondary | ICD-10-CM

## 2023-11-02 DIAGNOSIS — M6281 Muscle weakness (generalized): Secondary | ICD-10-CM

## 2023-11-02 NOTE — Therapy (Unsigned)
 OUTPATIENT PHYSICAL THERAPY BALANCE TREATMENT   Patient Name: Jamie [name] MRN: [id number] DOB:[birth year], 83 y.o., female Today's Date: 11/02/2023   PT End of Session - 11/02/23 1453     Visit Number 3    Number of Visits 17    Date for PT Re-Evaluation 12/16/23    Authorization Type HTA [id number]    PT Start Time 1524    PT Stop Time 1602    PT Time Calculation (min) 38 min    Equipment Utilized During Treatment Gait belt    Activity Tolerance Patient tolerated treatment well    Behavior During Therapy WFL for tasks assessed/performed            Past Medical History:  Diagnosis Date   Anemia    Anxiety    Arthritis    Blood transfusion without reported diagnosis    Cataract 2014   GERD (gastroesophageal reflux disease)    Headache    Hypertension    patient denies   Hypothyroidism    Macular degeneration    Ovarian cyst, left 2021   PONV (postoperative nausea and vomiting)    Past Surgical History:  Procedure Laterality Date   ABDOMINAL HYSTERECTOMY  1980   APPENDECTOMY     CATARACT EXTRACTION Bilateral 2014   CHOLECYSTECTOMY     COLONOSCOPY WITH PROPOFOL  N/A 03/22/2015   Procedure: COLONOSCOPY WITH PROPOFOL ;  Surgeon: Lamar ONEIDA Holmes, MD;  Location: [facility] ENDOSCOPY;  Service: Endoscopy;  Laterality: N/A;   EYE SURGERY     FRACTURE SURGERY  1989   LAPAROSCOPIC BILATERAL SALPINGO OOPHERECTOMY Bilateral 09/11/2019   Procedure: LAPAROSCOPIC BILATERAL SALPINGO OOPHORECTOMY;  Surgeon: Schermerhorn, Debby PARAS, MD;  Location: [facility] ORS;  Service: Gynecology;  Laterality: Bilateral;   TUBAL LIGATION     Patient Active Problem List   Diagnosis Date Noted   Impacted cerumen of right ear 04/13/2023   Chronic pain of multiple joints 04/13/2023   High risk medication use 12/11/2022   Annual physical exam 12/11/2022   Osteopenia 05/22/2022   Hormone replacement therapy 05/22/2022   Pain in limb 03/13/2022   Macular degeneration 12/24/2020   Cyst of ovary 05/03/2015    Foot pain 11/22/2014   Hypercholesteremia 11/20/2014   Cardiac murmur 11/20/2014   Temporary cerebral vascular dysfunction 11/20/2014   Gout attack 11/20/2014   Shortness of breath 11/20/2014   L-S radiculopathy 09/27/2014   Migraine 07/31/2014   Hypothyroidism 07/31/2014   Colon polyp 12/24/2009   Low back pain 06/28/2009   Anxiety state 10/26/2008   Esophageal stenosis 10/26/2008   Hemorrhoids, internal 10/26/2008   Menopausal and perimenopausal disorder 10/26/2008   Gastro-esophageal reflux disease without esophagitis 10/26/2008   Edema 10/26/2008   Generalized anxiety disorder 10/26/2008   Allergic rhinitis 07/13/2006    PCP: Lauraine LOISE Buoy, DO  REFERRING PROVIDER: Lauraine LOISE Buoy, DO  REFERRING DIAGNOSIS:  R26.89 (ICD-10-CM) - Balance problem    THERAPY DIAG: Imbalance  Difficulty in walking, not elsewhere classified  Muscle weakness (generalized)  ONSET DATE: January 2025  FOLLOW UP APPT WITH PROVIDER: Yes ; next physical with Dr. Buoy in October    RATIONALE FOR EVALUATION AND TREATMENT: Rehabilitation  SUBJECTIVE:  Chief Complaint: Pt is an 83 year old female referred for unsteady gait and balance impairment - PCP believes it is due to decreased activity.   Pertinent History Pt is an 83 year old female referred for unsteady gait and balance impairment - PCP believes it is due to decreased activity. Pt's husband passed away in 03/29/2023. She states that since he passed, she has had more weakness with not being as active. Pt reports no falls, but she intermittently runs into items by mistake. Patient reports remote Hx of sciatic pain that has improved; episodic pain that she is not experiencing presently. Pt has intermittent chiropractic intervention for back pain that helps. Pt  reports some stumbling with going up/down steps. Hx of hypothyroidism, anxiety, anemia. Pt repots walking up to 1.5 miles per day. She reports imbalance more with small steps and smaller movements with her feet.    Pain: No Numbness/Tingling: No Focal Weakness: Yes; LLE weakness Recent changes in overall health/medication: No; major life change with passing of her husband  Prior history of physical therapy for balance:  No Falls: Has patient fallen in last 6 months? 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 Handrails in shower, non-slip floor, shower seat available.  Has following equipment at home: None   Patient Goals: LE strengthening, feel more confident with walking and going up/down steps     OBJECTIVE:   Patient Surveys  ABC: 38.1%  Cognition Patient is oriented to person, place, and time.  Recent memory is intact.  Remote memory is intact.  Attention span and concentration are intact.  Expressive speech is intact.  Patient's fund of knowledge is within normal limits for educational level.    Gross Musculoskeletal Assessment Tremor: None Bulk: Normal Tone: Normal   GAIT: Distance walked: 160 ft Assistive device utilized: None Level of assistance: SBA Comments: Slight lateral shift to R side and forward flexed posture  throughout gait cycle. Sound heel strike, but diminished step length/stride length. Mild dec arm swing   Posture: FHRS, increased thoracic and lumbar kyphosis; pt able to largely self-correct posture in standing   AROM  AROM (Normal range in degrees) AROM  10/19/2023      Ankle    Dorsiflexion (20) WNL WNL  Plantarflexion (50) WNL WNL  Inversion (35)    Eversion (15    (* = pain; Blank rows = not tested)   LE MMT:  MMT (out of 5) Right 10/19/2023 Left 10/19/2023  Hip flexion 4- 4-  Hip extension    Hip abduction (seated) 4+ 4+  Hip adduction (seated) 5 5  Hip internal rotation    Hip external rotation    Knee flexion 5 4+  Knee extension 5 4+  Ankle dorsiflexion 5 5  Ankle plantarflexion 4+ 4  Ankle inversion    Ankle eversion    (* = pain; Blank rows = not tested)   Sensation Deferred   Reflexes Deferred   Cranial Nerves Deferred  Coordination/Cerebellar Finger to Nose: WNL Heel to Shin: WNL Rapid alternating movements: WNL Finger Opposition: WNL Pronator Drift: Negative    Clinical Test of Sensory Interaction for Balance    (CTSIB):  CONDITION TIME STRATEGY SWAY  Eyes open, firm surface 30 seconds ankle   Eyes closed, firm surface 30 seconds ankle Increased anterior/posterior sway  Eyes open, foam surface 30 seconds ankle Increased fasciculations and sway  Eyes closed, foam surface 14 seconds Ankle, hip      FUNCTIONAL OUTCOME MEASURES   Results Comments  BERG 48/56   DGI 19/24   TUG 11.5 seconds Duration below fall risk cut-off  5TSTS 17 seconds Fall risk, in need of intervention  (Blank rows = not tested)     TODAY'S TREATMENT    SUBJECTIVE STATEMENT:   Patient reports no recent falls; no new concerns at arrival. Patient reports doing okay with updated HEP.    Therapeutic Exercise - improved strength as needed to improve performance of CKC activities/functional movements and as needed for power production to prevent fall  during episode of large postural perturbation  NuStep; Level 4-5, x 5 minutes - for improved soft tissue mobility and increased tissue temperature to improve muscle performance   -subjective gathered during this time  PATIENT EDUCATION: Updated HEP and provided new [employer] handout.    Neuromuscular Re-education - for improved sensory integration, static and dynamic postural control, equilibrium and non-equilibrium coordination as needed for negotiating home and community environment and stepping over obstacles   Semitandem stance; 2 x 30 sec, bilat  Toe tapping, 2x10, 6-inch step  Standing on Airex with head turns, vertical/horizontal;      PATIENT EDUCATION:  Education details: see above for patient education details Person educated: Patient Education method: Explanation, Demonstration, and Handouts Education comprehension: verbalized understanding and returned demonstration   HOME EXERCISE PROGRAM: Access Code: [email or web address] URL: [email or web address] Date: 10/27/2023 Prepared by: Venetia Endo  Exercises - Sit to Stand Without Arm Support  - 2 x daily - 7 x weekly - 2 sets - 10 reps - Standing March with Counter Support  - 2 x daily - 7 x weekly - 2 sets - 10 reps - Heel Raises with Counter Support  - 2 x daily - 7 x weekly - 2 sets - 10 reps - Forward Step Touch  - 2 x daily - 7 x weekly - 2 sets - 10 reps - Standing Romberg to 1/2 Tandem Stance  - 2 x daily - 7 x weekly - 3 sets - 20-30sec hold   ASSESSMENT:  CLINICAL IMPRESSION: Patient ***.  Patient will benefit from skilled PT to address remaining deficits and improve overall function.  REHAB POTENTIAL: Good  CLINICAL DECISION MAKING: Evolving/moderate complexity  EVALUATION COMPLEXITY: Moderate   GOALS: Goals reviewed with patient? Yes  SHORT TERM GOALS: Target date: 11/09/2023  Pt will be independent with HEP in order to improve strength and balance in order to decrease fall risk and  improve function at home. Baseline: 10/19/23: Baseline home exercises given, to be updated over next 2-3 weeks. Goal status: INITIAL   LONG TERM GOALS: Target date: 12/16/2023  Pt will negotiate full flight of steps with unilateral handrail support with reciprocal pattern IND with no notable LOB or gait deviations. Baseline: 10/19/23: Perceived difficulty and concern with stair safety.  Goal status: INITIAL  2.  Pt will improve BERG by at least 3 points in order to demonstrate clinically significant improvement in balance.   Baseline: 10/19/23: To be completed  on visit # 2.       10/27/23: 48/56 Goal status: INITIAL  3.  Pt will improve ABC by at least 13% in order to demonstrate clinically significant improvement in balance confidence.      Baseline: 10/19/23: 38.1% Goal status: INITIAL  4. Pt will decrease 5TSTS to less than 12 sec in order to demonstrate clinically significant improvement in LE strength and being superior to fall risk cut-off score Baseline: 10/19/23: 17 sec Goal status: INITIAL  5. Pt will improve DGI by at least 3 points in order to demonstrate clinically significant improvement in balance and decreased risk for falls.     Baseline: 10/19/23: To be completed on visit # 2.    10/27/23: 19/24 Goal status: INITIAL     PLAN: PT FREQUENCY: 2x/week  PT DURATION: 8 weeks  PLANNED INTERVENTIONS: Therapeutic exercises, Therapeutic activity, Neuromuscular re-education, Balance training, Gait training, Patient education, Self-care/Home management  PLAN FOR NEXT SESSION:  LE strengthening and postural control training. Exercises integrating head turns and 90/180-degree changes in body position. Test VOR/head thrust as indicated.    Venetia Endo, PT, DPT #[id number]  Venetia ONEIDA Endo 11/02/2023, 4:02 PM

## 2023-11-04 ENCOUNTER — Ambulatory Visit: Admitting: Physical Therapy

## 2023-11-04 DIAGNOSIS — R2689 Other abnormalities of gait and mobility: Secondary | ICD-10-CM | POA: Diagnosis not present

## 2023-11-04 DIAGNOSIS — R262 Difficulty in walking, not elsewhere classified: Secondary | ICD-10-CM

## 2023-11-04 DIAGNOSIS — M6281 Muscle weakness (generalized): Secondary | ICD-10-CM

## 2023-11-04 NOTE — Therapy (Signed)
 OUTPATIENT PHYSICAL THERAPY BALANCE TREATMENT   Patient Name: Jamie [name] MRN: [id number] DOB:09-29-1940, 83 y.o., female Today's Date: 11/04/2023   PT End of Session - 11/08/23 0908     Visit Number 4    Number of Visits 17    Date for PT Re-Evaluation 12/16/23    Authorization Type HTA 2025    PT Start Time 0820    PT Stop Time 0900    PT Time Calculation (min) 40 min    Equipment Utilized During Treatment Gait belt    Activity Tolerance Patient tolerated treatment well    Behavior During Therapy WFL for tasks assessed/performed           Past Medical History:  Diagnosis Date   Anemia    Anxiety    Arthritis    Blood transfusion without reported diagnosis    Cataract 2014   GERD (gastroesophageal reflux disease)    Headache    Hypertension    patient denies   Hypothyroidism    Macular degeneration    Ovarian cyst, left 2021   PONV (postoperative nausea and vomiting)    Past Surgical History:  Procedure Laterality Date   ABDOMINAL HYSTERECTOMY  1980   APPENDECTOMY     CATARACT EXTRACTION Bilateral 2014   CHOLECYSTECTOMY     COLONOSCOPY WITH PROPOFOL  N/A 03/22/2015   Procedure: COLONOSCOPY WITH PROPOFOL ;  Surgeon: Lamar ONEIDA Holmes, MD;  Location: [facility] ENDOSCOPY;  Service: Endoscopy;  Laterality: N/A;   EYE SURGERY     FRACTURE SURGERY  1989   LAPAROSCOPIC BILATERAL SALPINGO OOPHERECTOMY Bilateral 09/11/2019   Procedure: LAPAROSCOPIC BILATERAL SALPINGO OOPHORECTOMY;  Surgeon: Schermerhorn, Debby PARAS, MD;  Location: [facility] ORS;  Service: Gynecology;  Laterality: Bilateral;   TUBAL LIGATION     Patient Active Problem List   Diagnosis Date Noted   Impacted cerumen of right ear 04/13/2023   Chronic pain of multiple joints 04/13/2023   High risk medication use 12/11/2022   Annual physical exam 12/11/2022   Osteopenia 05/22/2022   Hormone replacement therapy 05/22/2022   Pain in limb 03/13/2022   Macular degeneration 12/24/2020   Cyst of ovary 05/03/2015    Foot pain 11/22/2014   Hypercholesteremia 11/20/2014   Cardiac murmur 11/20/2014   Temporary cerebral vascular dysfunction 11/20/2014   Gout attack 11/20/2014   Shortness of breath 11/20/2014   L-S radiculopathy 09/27/2014   Migraine 07/31/2014   Hypothyroidism 07/31/2014   Colon polyp 12/24/2009   Low back pain 06/28/2009   Anxiety state 10/26/2008   Esophageal stenosis 10/26/2008   Hemorrhoids, internal 10/26/2008   Menopausal and perimenopausal disorder 10/26/2008   Gastro-esophageal reflux disease without esophagitis 10/26/2008   Edema 10/26/2008   Generalized anxiety disorder 10/26/2008   Allergic rhinitis 07/13/2006    PCP: Lauraine LOISE Buoy, DO  REFERRING PROVIDER: Lauraine LOISE Buoy, DO  REFERRING DIAGNOSIS:  R26.89 (ICD-10-CM) - Balance problem    THERAPY DIAG: Imbalance  Difficulty in walking, not elsewhere classified  Muscle weakness (generalized)  ONSET DATE: January 2025  FOLLOW UP APPT WITH PROVIDER: Yes ; next physical with Dr. Buoy in October    RATIONALE FOR EVALUATION AND TREATMENT: Rehabilitation  SUBJECTIVE:  Chief Complaint: Pt is an 83 year old female referred for unsteady gait and balance impairment - PCP believes it is due to decreased activity.   Pertinent History Pt is an 83 year old female referred for unsteady gait and balance impairment - PCP believes it is due to decreased activity. Pt's husband passed away in 2023-03-28. She states that since he passed, she has had more weakness with not being as active. Pt reports no falls, but she intermittently runs into items by mistake. Patient reports remote Hx of sciatic pain that has improved; episodic pain that she is not experiencing presently. Pt has intermittent chiropractic intervention for back pain that helps. Pt reports  some stumbling with going up/down steps. Hx of hypothyroidism, anxiety, anemia. Pt repots walking up to 1.5 miles per day. She reports imbalance more with small steps and smaller movements with her feet.    Pain: No Numbness/Tingling: No Focal Weakness: Yes; LLE weakness Recent changes in overall health/medication: No; major life change with passing of her husband  Prior history of physical therapy for balance:  No Falls: Has patient fallen in last 6 months? 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 Handrails in shower, non-slip floor, shower seat available.  Has following equipment at home: None   Patient Goals: LE strengthening, feel more confident with walking and going up/down steps     OBJECTIVE (data from initial evaluation unless otherwise dated):   Patient Surveys  ABC: 38.1%  GAIT: Distance walked: 160 ft Assistive device utilized: None Level of assistance: SBA Comments: Slight lateral shift to R side and forward flexed posture throughout gait cycle. Sound heel strike, but diminished step length/stride length. Mild dec arm swing  Posture: FHRS, increased thoracic and lumbar kyphosis; pt able to largely self-correct posture in standing  AROM AROM (Normal range in degrees) AROM  10/19/2023      Ankle    Dorsiflexion (20) WNL WNL   Plantarflexion (50) WNL WNL  Inversion (35)    Eversion (15    (* = pain; Blank rows = not tested)   LE MMT: MMT (out of 5) Right 10/19/2023 Left 10/19/2023  Hip flexion 4- 4-  Hip extension    Hip abduction (seated) 4+ 4+  Hip adduction (seated) 5 5  Hip internal rotation    Hip external rotation    Knee flexion 5 4+  Knee extension 5 4+  Ankle dorsiflexion 5 5  Ankle plantarflexion 4+ 4  Ankle inversion    Ankle eversion    (* = pain; Blank rows = not tested)  Coordination/Cerebellar Finger to Nose: WNL Heel to Shin: WNL Rapid alternating movements: WNL Finger Opposition: WNL Pronator Drift: Negative  Clinical Test of Sensory Interaction for Balance    (CTSIB):  CONDITION TIME STRATEGY SWAY  Eyes open, firm surface 30 seconds ankle   Eyes closed, firm surface 30 seconds ankle Increased anterior/posterior sway  Eyes open, foam surface 30 seconds ankle Increased fasciculations and sway  Eyes closed, foam surface 14 seconds Ankle, hip     FUNCTIONAL OUTCOME MEASURES   Results Comments  BERG 48/56   DGI 19/24   TUG 11.5 seconds Duration below fall risk cut-off  5TSTS 17 seconds Fall risk, in need of intervention  (Blank rows = not tested)    TODAY'S TREATMENT    11/04/23   SUBJECTIVE STATEMENT:   Patient reports no recent near-falls/falls. No new concerns this AM.    Therapeutic Exercise - improved strength as needed to improve performance of CKC activities/functional movements and as needed for power production to prevent fall during episode of large postural perturbation  NuStep; Level 4-5, x 5 minutes - for improved soft tissue mobility and increased tissue temperature to improve muscle performance   -subjective gathered during this time  Forward step-up; 2 x 10 on 6-inch step, center of gym  PATIENT EDUCATION: Reviewed HEP and discussed expected progression of PT.     Neuromuscular Re-education - for improved sensory integration, static and dynamic  postural control, equilibrium and non-equilibrium coordination as needed for negotiating home and community environment and stepping over obstacles  Dynamic march/high knees in // bars; 5x D/B High knees with opposite knee touch; x3 D/B in // bars  Hurdle stepping, mixed height, 6 and 12-inch hurdles; 4x DB in // bars   Balance clock (calling out 12/6/9/3 o'clock) 90-degree Airex step up, forward step-over, retro step; x 4 minutes  Semitandem stance; 2 x 30 sec, bilat  Toe tapping, 2x10, 12-inch step  Standing on Airex with head turns, vertical/horizontal; x 20 for each  Alternating forward bounce off of wall with rainbow ball, alternating to L and R wall in hallway; x 20 alternating    PATIENT EDUCATION:  Education details: see above for patient education details Person educated: Patient Education method: Explanation, Demonstration, and Handouts Education comprehension: verbalized understanding and returned demonstration   HOME EXERCISE PROGRAM: Access Code: [email or web address] URL: [email or web address] Date: 10/27/2023 Prepared by: Venetia Endo  Exercises - Sit to Stand Without Arm Support  - 2 x daily - 7 x weekly - 2 sets - 10 reps - Standing March with Counter Support  - 2 x daily - 7 x weekly - 2 sets - 10 reps - Heel Raises with Counter Support  - 2 x daily - 7 x weekly - 2 sets - 10 reps - Forward Step Touch  - 2 x daily - 7 x weekly - 2 sets - 10 reps - Standing Romberg to 1/2 Tandem Stance  - 2 x daily - 7 x weekly - 3 sets - 20-30sec hold   ASSESSMENT:  CLINICAL IMPRESSION: Patient participates very well with PT, and she is more challenged today with 90-degree turns with combined stepping onto compliant surfaces with goal of improving stability with completing abrupt turns and stepping onto compliant/uneven ground. Patient is able to modestly progress with weight shifting and postural control work with increased volume of neuromuscular re-education drills  this visit. Pt will benefit from further progression of postural control and balance work to improve fall risk and confidence with functional mobility. Patient will benefit from skilled PT to address remaining deficits and improve overall function.  REHAB POTENTIAL: Good  CLINICAL DECISION MAKING: Evolving/moderate complexity  EVALUATION COMPLEXITY: Moderate   GOALS: Goals reviewed with patient? Yes  SHORT TERM GOALS: Target date: 11/09/2023  Pt will be independent with HEP in order to improve strength and balance in order to decrease fall risk and improve function at home. Baseline:  10/19/23: Baseline home exercises given, to be updated over next 2-3 weeks. Goal status: INITIAL   LONG TERM GOALS: Target date: 12/16/2023  Pt will negotiate full flight of steps with unilateral handrail support with reciprocal pattern IND with no notable LOB or gait deviations. Baseline: 10/19/23: Perceived difficulty and concern with stair safety.  Goal status: INITIAL  2.  Pt will improve BERG by at least 3 points in order to demonstrate clinically significant improvement in balance.   Baseline: 10/19/23: To be completed on visit # 2.       10/27/23: 48/56 Goal status: INITIAL  3.  Pt will improve ABC by at least 13% in order to demonstrate clinically significant improvement in balance confidence.      Baseline: 10/19/23: 38.1% Goal status: INITIAL  4. Pt will decrease 5TSTS to less than 12 sec in order to demonstrate clinically significant improvement in LE strength and being superior to fall risk cut-off score Baseline: 10/19/23: 17 sec Goal status: INITIAL  5. Pt will improve DGI by at least 3 points in order to demonstrate clinically significant improvement in balance and decreased risk for falls.     Baseline: 10/19/23: To be completed on visit # 2.    10/27/23: 19/24 Goal status: INITIAL     PLAN: PT FREQUENCY: 2x/week  PT DURATION: 8 weeks  PLANNED INTERVENTIONS: Therapeutic exercises,  Therapeutic activity, Neuromuscular re-education, Balance training, Gait training, Patient education, Self-care/Home management  PLAN FOR NEXT SESSION:  LE strengthening and postural control training. Exercises integrating head turns and 90/180-degree changes in body position. Test VOR/head thrust as indicated.    Venetia Endo, PT, DPT #[id number]  Venetia ONEIDA Endo 11/08/2023, 9:08 AM

## 2023-11-08 ENCOUNTER — Encounter: Payer: Self-pay | Admitting: Physical Therapy

## 2023-11-08 ENCOUNTER — Ambulatory Visit: Admitting: Physical Therapy

## 2023-11-08 DIAGNOSIS — R262 Difficulty in walking, not elsewhere classified: Secondary | ICD-10-CM

## 2023-11-08 DIAGNOSIS — R2689 Other abnormalities of gait and mobility: Secondary | ICD-10-CM | POA: Diagnosis not present

## 2023-11-08 DIAGNOSIS — M6281 Muscle weakness (generalized): Secondary | ICD-10-CM

## 2023-11-08 NOTE — Therapy (Signed)
 OUTPATIENT PHYSICAL THERAPY BALANCE TREATMENT   Patient Name: Jamie [name] MRN: [id number] DOB:04/07/1940, 83 y.o., female Today's Date: 11/08/2023    PT End of Session - 11/08/23 1248     Visit Number 5    Number of Visits 17    Date for PT Re-Evaluation 12/16/23    Authorization Type HTA [id number]    PT Start Time 1245    PT Stop Time 1330    PT Time Calculation (min) 45 min    Equipment Utilized During Treatment Gait belt    Activity Tolerance Patient tolerated treatment well    Behavior During Therapy WFL for tasks assessed/performed          Past Medical History:  Diagnosis Date   Anemia    Anxiety    Arthritis    Blood transfusion without reported diagnosis    Cataract 2014   GERD (gastroesophageal reflux disease)    Headache    Hypertension    patient denies   Hypothyroidism    Macular degeneration    Ovarian cyst, left 2021   PONV (postoperative nausea and vomiting)    Past Surgical History:  Procedure Laterality Date   ABDOMINAL HYSTERECTOMY  1980   APPENDECTOMY     CATARACT EXTRACTION Bilateral 2014   CHOLECYSTECTOMY     COLONOSCOPY WITH PROPOFOL  N/A 03/22/2015   Procedure: COLONOSCOPY WITH PROPOFOL ;  Surgeon: Lamar ONEIDA Holmes, MD;  Location: [facility] ENDOSCOPY;  Service: Endoscopy;  Laterality: N/A;   EYE SURGERY     FRACTURE SURGERY  1989   LAPAROSCOPIC BILATERAL SALPINGO OOPHERECTOMY Bilateral 09/11/2019   Procedure: LAPAROSCOPIC BILATERAL SALPINGO OOPHORECTOMY;  Surgeon: Schermerhorn, Debby PARAS, MD;  Location: [facility] ORS;  Service: Gynecology;  Laterality: Bilateral;   TUBAL LIGATION     Patient Active Problem List   Diagnosis Date Noted   Impacted cerumen of right ear 04/13/2023   Chronic pain of multiple joints 04/13/2023   High risk medication use 12/11/2022   Annual physical exam 12/11/2022   Osteopenia 05/22/2022   Hormone replacement therapy 05/22/2022   Pain in limb 03/13/2022   Macular degeneration 12/24/2020   Cyst of ovary 05/03/2015    Foot pain 11/22/2014   Hypercholesteremia 11/20/2014   Cardiac murmur 11/20/2014   Temporary cerebral vascular dysfunction 11/20/2014   Gout attack 11/20/2014   Shortness of breath 11/20/2014   L-S radiculopathy 09/27/2014   Migraine 07/31/2014   Hypothyroidism 07/31/2014   Colon polyp 12/24/2009   Low back pain 06/28/2009   Anxiety state 10/26/2008   Esophageal stenosis 10/26/2008   Hemorrhoids, internal 10/26/2008   Menopausal and perimenopausal disorder 10/26/2008   Gastro-esophageal reflux disease without esophagitis 10/26/2008   Edema 10/26/2008   Generalized anxiety disorder 10/26/2008   Allergic rhinitis 07/13/2006    PCP: Lauraine LOISE Buoy, DO  REFERRING PROVIDER: Lauraine LOISE Buoy, DO  REFERRING DIAGNOSIS:  R26.89 (ICD-10-CM) - Balance problem    THERAPY DIAG: Imbalance  Difficulty in walking, not elsewhere classified  Muscle weakness (generalized)  ONSET DATE: 28-Mar-2023 FOLLOW UP APPT WITH PROVIDER: Yes ; next physical with Dr. Buoy in October    RATIONALE FOR EVALUATION AND TREATMENT: Rehabilitation  PERTINENT HISTORY: Pt is an 83 year old female referred for unsteady gait and balance impairment - PCP believes it is due to decreased activity. Pt's husband passed away in March 28, 2023. She states that since he passed, she has had more weakness with not being as active. Pt reports no falls, but she intermittently runs into items by mistake. Patient reports  remote Hx of sciatic pain that has improved; episodic pain that she is not experiencing presently. Pt has intermittent chiropractic intervention for back pain that helps. Pt reports some stumbling with going up/down steps. Hx of hypothyroidism, anxiety, anemia. Pt repots walking up to 1.5 miles per day. She reports imbalance more with small steps and smaller movements with her feet.   Pain: No Numbness/Tingling: No Focal Weakness: Yes; LLE weakness Recent changes in overall health/medication: No; major life change  with passing of her husband  Prior history of physical therapy for balance:  No Falls: Has patient fallen in last 6 months? 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 Handrails in shower, non-slip floor, shower seat available.  Has following equipment at home: None   Patient Goals: LE strengthening, feel more confident with walking and going up/down steps     OBJECTIVE (data from initial evaluation unless otherwise dated):   Patient Surveys  ABC: 38.1%  GAIT: Distance walked: 160 ft Assistive device utilized: None Level of assistance: SBA Comments: Slight lateral shift to R side and forward flexed posture throughout gait cycle. Sound heel strike, but diminished step length/stride length. Mild dec arm swing  Posture: FHRS, increased thoracic and lumbar kyphosis; pt able to largely self-correct posture in standing  AROM AROM (Normal range in degrees) AROM  10/19/2023      Ankle    Dorsiflexion (20) WNL WNL  Plantarflexion (50) WNL WNL  Inversion (35)    Eversion (15    (* = pain; Blank rows = not tested)   LE MMT: MMT (out of 5) Right 10/19/2023 Left 10/19/2023  Hip flexion 4- 4-  Hip extension    Hip abduction (seated) 4+ 4+  Hip adduction (seated) 5 5  Hip internal rotation    Hip external rotation    Knee flexion 5 4+  Knee  extension 5 4+  Ankle dorsiflexion 5 5  Ankle plantarflexion 4+ 4  Ankle inversion    Ankle eversion    (* = pain; Blank rows = not tested)  Coordination/Cerebellar Finger to Nose: WNL Heel to Shin: WNL Rapid alternating movements: WNL Finger Opposition: WNL Pronator Drift: Negative  Clinical Test of Sensory Interaction for Balance    (CTSIB):  CONDITION TIME STRATEGY SWAY  Eyes open, firm surface 30 seconds ankle   Eyes closed, firm surface 30 seconds ankle Increased anterior/posterior sway  Eyes open, foam surface 30 seconds ankle Increased fasciculations and sway  Eyes closed, foam surface 14 seconds Ankle, hip     FUNCTIONAL OUTCOME MEASURES   Results Comments  BERG 48/56   DGI 19/24   TUG 11.5 seconds Duration below fall risk cut-off  5TSTS 17 seconds Fall risk, in need of intervention  (Blank rows = not tested)     TODAY'S TREATMENT    11/08/2023   SUBJECTIVE STATEMENT:   Patient reports no recent near-falls/falls. No new concerns or major changes today.    Therapeutic  Exercise - improved strength as needed to improve performance of CKC activities/functional movements and as needed for power production to prevent fall during episode of large postural perturbation  NuStep; Level 4-5, x 5 minutes - for improved soft tissue mobility and increased tissue temperature to improve muscle performance   -subjective gathered during this time  Forward step-up to hurdle stance; 2 x 10 on 6-inch step, center of gym  PATIENT EDUCATION: Reviewed HEP and discussed expected progression of PT.     Neuromuscular Re-education - for improved sensory integration, static and dynamic postural control, equilibrium and non-equilibrium coordination as needed for negotiating home and community environment and stepping over obstacles   High knees with opposite knee touch; x3 D/B in // bars  Hurdle stepping, mixed height, 6 and 12-inch hurdles; 4x DB in // bars   Balance clock  (calling out 12/6/9/3 o'clock) 90-degree Airex step up, forward step-over, retro step; x 4 minutes  Toe tapping, 2x10, 12-inch step  Standing on Airex with head turns, vertical/horizontal; x 20 for each  Obstacle course in hallway: 2 Airex pads step up/down, step over long Airex (horizontally oriented), alternating lateral cone taps (4 on R and L); x2 D/B   *next visit* Alternating forward bounce off of wall with rainbow ball, alternating to L and R wall in hallway; x 20 alternating     *not today* Semitandem stance; 2 x 30 sec, bilat   Dynamic march/high knees in // bars; 5x D/B   PATIENT EDUCATION:  Education details: see above for patient education details Person educated: Patient Education method: Explanation, Demonstration, and Handouts Education comprehension: verbalized understanding and returned demonstration   HOME EXERCISE PROGRAM: Access Code: [email or web address] URL: [email or web address] Date: 10/27/2023 Prepared by: Venetia Endo  Exercises - Sit to Stand Without Arm Support  - 2 x daily - 7 x weekly - 2 sets - 10 reps - Standing March with Counter Support  - 2 x daily - 7 x weekly - 2 sets - 10 reps - Heel Raises with Counter Support  - 2 x daily - 7 x weekly - 2 sets - 10 reps - Forward Step Touch  - 2 x daily - 7 x weekly - 2 sets - 10 reps - Standing Romberg to 1/2 Tandem Stance  - 2 x daily - 7 x weekly - 3 sets - 20-30sec hold   ASSESSMENT:  CLINICAL IMPRESSION: Patient is still notably challenged with Airex drills. She demonstrates no notable instability with stepping up c hurdle stance. Pt is able to markedly progress with dynamic balance demands in office without LOB requiring assist from PT to prevent fall - pt has occasional rapid corrective steps with steps requiring brief unipedal stance (e.g. alternating lateral cone taps). Pt will benefit from further progression of postural control and balance work to improve fall risk and confidence with  functional mobility. Patient will benefit from skilled PT to address remaining deficits and improve overall function.  REHAB POTENTIAL: Good  CLINICAL DECISION MAKING: Evolving/moderate complexity  EVALUATION COMPLEXITY: Moderate   GOALS: Goals reviewed with patient? Yes  SHORT TERM GOALS: Target date: 11/09/2023  Pt will be independent with HEP in order to improve strength and balance in order to decrease fall risk and improve function at home. Baseline: 10/19/23: Baseline home exercises given, to be updated over next 2-3 weeks. Goal status: INITIAL   LONG TERM GOALS: Target date: 12/16/2023  Pt will negotiate full flight of steps with unilateral handrail support with reciprocal pattern IND with  no notable LOB or gait deviations. Baseline: 10/19/23: Perceived difficulty and concern with stair safety.  Goal status: INITIAL  2.  Pt will improve BERG by at least 3 points in order to demonstrate clinically significant improvement in balance.   Baseline: 10/19/23: To be completed on visit # 2.       10/27/23: 48/56 Goal status: INITIAL  3.  Pt will improve ABC by at least 13% in order to demonstrate clinically significant improvement in balance confidence.      Baseline: 10/19/23: 38.1% Goal status: INITIAL  4. Pt will decrease 5TSTS to less than 12 sec in order to demonstrate clinically significant improvement in LE strength and being superior to fall risk cut-off score Baseline: 10/19/23: 17 sec Goal status: INITIAL  5. Pt will improve DGI by at least 3 points in order to demonstrate clinically significant improvement in balance and decreased risk for falls.     Baseline: 10/19/23: To be completed on visit # 2.    10/27/23: 19/24 Goal status: INITIAL     PLAN: PT FREQUENCY: 2x/week  PT DURATION: 8 weeks  PLANNED INTERVENTIONS: Therapeutic exercises, Therapeutic activity, Neuromuscular re-education, Balance training, Gait training, Patient education, Self-care/Home  management  PLAN FOR NEXT SESSION:  LE strengthening and postural control training. Exercises integrating head turns and 90/180-degree changes in body position. Test VOR/head thrust as indicated.    Venetia Endo, PT, DPT #[id number]  Venetia ONEIDA Endo 11/08/2023, 3:10 PM

## 2023-11-08 NOTE — Therapy (Deleted)
 OUTPATIENT PHYSICAL THERAPY BALANCE TREATMENT   Patient Name: Jamie [name] MRN: [id number] DOB:Jan 02, 1941, 83 y.o., female Today's Date: 11/04/2023      Past Medical History:  Diagnosis Date   Anemia    Anxiety    Arthritis    Blood transfusion without reported diagnosis    Cataract 2014   GERD (gastroesophageal reflux disease)    Headache    Hypertension    patient denies   Hypothyroidism    Macular degeneration    Ovarian cyst, left 2021   PONV (postoperative nausea and vomiting)    Past Surgical History:  Procedure Laterality Date   ABDOMINAL HYSTERECTOMY  1980   APPENDECTOMY     CATARACT EXTRACTION Bilateral 2014   CHOLECYSTECTOMY     COLONOSCOPY WITH PROPOFOL  N/A 03/22/2015   Procedure: COLONOSCOPY WITH PROPOFOL ;  Surgeon: Lamar ONEIDA Holmes, MD;  Location: [facility] ENDOSCOPY;  Service: Endoscopy;  Laterality: N/A;   EYE SURGERY     FRACTURE SURGERY  1989   LAPAROSCOPIC BILATERAL SALPINGO OOPHERECTOMY Bilateral 09/11/2019   Procedure: LAPAROSCOPIC BILATERAL SALPINGO OOPHORECTOMY;  Surgeon: Schermerhorn, Debby PARAS, MD;  Location: [facility] ORS;  Service: Gynecology;  Laterality: Bilateral;   TUBAL LIGATION     Patient Active Problem List   Diagnosis Date Noted   Impacted cerumen of right ear 04/13/2023   Chronic pain of multiple joints 04/13/2023   High risk medication use 12/11/2022   Annual physical exam 12/11/2022   Osteopenia 05/22/2022   Hormone replacement therapy 05/22/2022   Pain in limb 03/13/2022   Macular degeneration 12/24/2020   Cyst of ovary 05/03/2015   Foot pain 11/22/2014   Hypercholesteremia 11/20/2014   Cardiac murmur 11/20/2014   Temporary cerebral vascular dysfunction 11/20/2014   Gout attack 11/20/2014   Shortness of breath 11/20/2014   L-S radiculopathy 09/27/2014   Migraine 07/31/2014   Hypothyroidism 07/31/2014   Colon polyp 12/24/2009   Low back pain 06/28/2009   Anxiety state 10/26/2008   Esophageal stenosis 10/26/2008    Hemorrhoids, internal 10/26/2008   Menopausal and perimenopausal disorder 10/26/2008   Gastro-esophageal reflux disease without esophagitis 10/26/2008   Edema 10/26/2008   Generalized anxiety disorder 10/26/2008   Allergic rhinitis 07/13/2006    PCP: Lauraine LOISE Buoy, DO  REFERRING PROVIDER: Lauraine LOISE Buoy, DO  REFERRING DIAGNOSIS:  R26.89 (ICD-10-CM) - Balance problem    THERAPY DIAG: Imbalance  Muscle weakness (generalized)  Difficulty in walking, not elsewhere classified  ONSET DATE: January 2025  FOLLOW UP APPT WITH PROVIDER: Yes ; next physical with Dr. Buoy in October    RATIONALE FOR EVALUATION AND TREATMENT: Rehabilitation  SUBJECTIVE:  Chief Complaint: Pt is an 83 year old female referred for unsteady gait and balance impairment - PCP believes it is due to decreased activity.   Pertinent History Pt is an 83 year old female referred for unsteady gait and balance impairment - PCP believes it is due to decreased activity. Pt's husband passed away in 2023-03-29. She states that since he passed, she has had more weakness with not being as active. Pt reports no falls, but she intermittently runs into items by mistake. Patient reports remote Hx of sciatic pain that has improved; episodic pain that she is not experiencing presently. Pt has intermittent chiropractic intervention for back pain that helps. Pt reports some stumbling with going up/down steps. Hx of hypothyroidism, anxiety, anemia. Pt repots walking up to 1.5 miles per day. She reports imbalance more with small steps and smaller movements with her feet.    Pain: No Numbness/Tingling: No Focal Weakness: Yes; LLE weakness Recent changes in overall health/medication: No; major life change with passing of her husband  Prior history of  physical therapy for balance:  No Falls: Has patient fallen in last 6 months? 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 Handrails in shower, non-slip floor, shower seat available.  Has following equipment at home: None   Patient Goals: LE strengthening, feel more confident with walking and going up/down steps     OBJECTIVE (data from initial evaluation unless otherwise dated):   Patient Surveys  ABC: 38.1%  GAIT: Distance walked: 160 ft Assistive device utilized: None Level of assistance: SBA Comments: Slight lateral shift to R side and forward flexed posture throughout gait cycle. Sound heel strike, but diminished step length/stride length. Mild dec arm swing  Posture: FHRS, increased thoracic and lumbar kyphosis; pt able to largely self-correct posture in standing  AROM AROM (Normal range in degrees) AROM  10/19/2023      Ankle    Dorsiflexion (20) WNL WNL  Plantarflexion (50) WNL WNL  Inversion (35)    Eversion (15    (* = pain; Blank rows = not tested)   LE MMT: MMT (out of 5) Right 10/19/2023 Left 10/19/2023  Hip flexion 4- 4-  Hip extension    Hip abduction (seated) 4+ 4+  Hip adduction (seated) 5 5  Hip internal rotation    Hip external rotation    Knee flexion 5 4+  Knee extension 5 4+  Ankle dorsiflexion 5 5  Ankle  plantarflexion 4+ 4  Ankle inversion    Ankle eversion    (* = pain; Blank rows = not tested)  Coordination/Cerebellar Finger to Nose: WNL Heel to Shin: WNL Rapid alternating movements: WNL Finger Opposition: WNL Pronator Drift: Negative  Clinical Test of Sensory Interaction for Balance    (CTSIB):  CONDITION TIME STRATEGY SWAY  Eyes open, firm surface 30 seconds ankle   Eyes closed, firm surface 30 seconds ankle Increased anterior/posterior sway  Eyes open, foam surface 30 seconds ankle Increased fasciculations and sway  Eyes closed, foam surface 14 seconds Ankle, hip     FUNCTIONAL OUTCOME MEASURES   Results Comments  BERG 48/56   DGI 19/24   TUG 11.5 seconds Duration below fall risk cut-off  5TSTS 17 seconds Fall risk, in need of intervention  (Blank rows = not tested)    TODAY'S TREATMENT    11/04/23   SUBJECTIVE STATEMENT:   Patient reports no recent near-falls/falls. No new concerns this AM.    Therapeutic Exercise - improved strength as needed to improve performance of CKC activities/functional movements and as needed for power production to prevent fall during episode of large postural perturbation  NuStep; Level 4-5, x 5 minutes - for improved soft tissue mobility and increased tissue temperature to improve muscle performance   -subjective gathered during this time  Forward step-up; 2 x 10 on 6-inch step, center of gym  PATIENT EDUCATION: Reviewed HEP and discussed expected progression of PT.     Neuromuscular Re-education - for improved sensory integration, static and dynamic postural control, equilibrium and non-equilibrium coordination as needed for negotiating home and community environment and stepping over obstacles  Dynamic march/high knees in // bars; 5x D/B High knees with opposite knee touch; x3 D/B in // bars  Hurdle stepping, mixed height, 6 and 12-inch hurdles; 4x DB in // bars   Balance clock (calling out 12/6/9/3 o'clock) 90-degree Airex  step up, forward step-over, retro step; x 4 minutes  Semitandem stance; 2 x 30 sec, bilat  Toe tapping, 2x10, 12-inch step  Standing on Airex with head turns, vertical/horizontal; x 20 for each  Alternating forward bounce off of wall with rainbow ball, alternating to L and R wall in hallway; x 20 alternating    PATIENT EDUCATION:  Education details: see above for patient education details Person educated: Patient Education method: Explanation, Demonstration, and Handouts Education comprehension: verbalized understanding and returned demonstration   HOME EXERCISE PROGRAM: Access Code: [email or web address] URL: [email or web address] Date: 10/27/2023 Prepared by: Jamie [name]  Exercises - Sit to Stand Without Arm Support  - 2 x daily - 7 x weekly - 2 sets - 10 reps - Standing March with Counter Support  - 2 x daily - 7 x weekly - 2 sets - 10 reps - Heel Raises with Counter Support  - 2 x daily - 7 x weekly - 2 sets - 10 reps - Forward Step Touch  - 2 x daily - 7 x weekly - 2 sets - 10 reps - Standing Romberg to 1/2 Tandem Stance  - 2 x daily - 7 x weekly - 3 sets - 20-30sec hold   ASSESSMENT:  CLINICAL IMPRESSION: Patient participates very well with PT, and she is more challenged today with 90-degree turns with combined stepping onto compliant surfaces with goal of improving stability with completing abrupt turns and stepping onto compliant/uneven ground. Patient is able to modestly progress with weight shifting and postural control work with increased volume of neuromuscular re-education drills this visit. Pt will benefit from further progression of postural control and balance work to improve fall risk and confidence with functional mobility. Patient will benefit from skilled PT to address remaining deficits and improve overall function.  REHAB POTENTIAL: Good  CLINICAL DECISION MAKING: Evolving/moderate complexity  EVALUATION COMPLEXITY: Moderate   GOALS: Goals  reviewed with patient? Yes  SHORT TERM GOALS: Target date: 11/09/2023  Pt will be independent with HEP in order to improve strength and balance in order to decrease fall risk and improve function at home. Baseline:  10/19/23: Baseline home exercises given, to be updated over next 2-3 weeks. Goal status: INITIAL   LONG TERM GOALS: Target date: 12/16/2023  Pt will negotiate full flight of steps with unilateral handrail support with reciprocal pattern IND with no notable LOB or gait deviations. Baseline: 10/19/23: Perceived difficulty and concern with stair safety.  Goal status: INITIAL  2.  Pt will improve BERG by at least 3 points in order to demonstrate clinically significant improvement in balance.   Baseline: 10/19/23: To be completed on visit # 2.       10/27/23: 48/56 Goal status: INITIAL  3.  Pt will improve ABC by at least 13% in order to demonstrate clinically significant improvement in balance confidence.      Baseline: 10/19/23: 38.1% Goal status: INITIAL  4. Pt will decrease 5TSTS to less than 12 sec in order to demonstrate clinically significant improvement in LE strength and being superior to fall risk cut-off score Baseline: 10/19/23: 17 sec Goal status: INITIAL  5. Pt will improve DGI by at least 3 points in order to demonstrate clinically significant improvement in balance and decreased risk for falls.     Baseline: 10/19/23: To be completed on visit # 2.    10/27/23: 19/24 Goal status: INITIAL     PLAN: PT FREQUENCY: 2x/week  PT DURATION: 8 weeks  PLANNED INTERVENTIONS: Therapeutic exercises, Therapeutic activity, Neuromuscular re-education, Balance training, Gait training, Patient education, Self-care/Home management  PLAN FOR NEXT SESSION:  LE strengthening and postural control training. Exercises integrating head turns and 90/180-degree changes in body position. Test VOR/head thrust as indicated.    Jamie [name], PT, DPT #[id number]  Jamie [name] 11/08/2023,  9:22 AM

## 2023-11-10 ENCOUNTER — Ambulatory Visit: Admitting: Physical Therapy

## 2023-11-11 ENCOUNTER — Ambulatory Visit: Admitting: Physical Therapy

## 2023-11-11 DIAGNOSIS — R262 Difficulty in walking, not elsewhere classified: Secondary | ICD-10-CM

## 2023-11-11 DIAGNOSIS — R2689 Other abnormalities of gait and mobility: Secondary | ICD-10-CM

## 2023-11-11 DIAGNOSIS — M6281 Muscle weakness (generalized): Secondary | ICD-10-CM

## 2023-11-11 NOTE — Therapy (Signed)
 OUTPATIENT PHYSICAL THERAPY BALANCE TREATMENT   Patient Name: Jamie [name] MRN: [id number] DOB:06/05/1940, 83 y.o., female Today's Date: 11/11/2023    PT End of Session - 11/11/23 1344     Visit Number 6    Number of Visits 17    Date for Recertification  12/16/23    Authorization Type HTA [id number]    PT Start Time 1344    PT Stop Time 1427    PT Time Calculation (min) 43 min    Equipment Utilized During Treatment Gait belt    Activity Tolerance Patient tolerated treatment well    Behavior During Therapy WFL for tasks assessed/performed           Past Medical History:  Diagnosis Date   Anemia    Anxiety    Arthritis    Blood transfusion without reported diagnosis    Cataract 2014   GERD (gastroesophageal reflux disease)    Headache    Hypertension    patient denies   Hypothyroidism    Macular degeneration    Ovarian cyst, left 2021   PONV (postoperative nausea and vomiting)    Past Surgical History:  Procedure Laterality Date   ABDOMINAL HYSTERECTOMY  1980   APPENDECTOMY     CATARACT EXTRACTION Bilateral 2014   CHOLECYSTECTOMY     COLONOSCOPY WITH PROPOFOL  N/A 03/22/2015   Procedure: COLONOSCOPY WITH PROPOFOL ;  Surgeon: Lamar ONEIDA Holmes, MD;  Location: [facility] ENDOSCOPY;  Service: Endoscopy;  Laterality: N/A;   EYE SURGERY     FRACTURE SURGERY  1989   LAPAROSCOPIC BILATERAL SALPINGO OOPHERECTOMY Bilateral 09/11/2019   Procedure: LAPAROSCOPIC BILATERAL SALPINGO OOPHORECTOMY;  Surgeon: Schermerhorn, Debby PARAS, MD;  Location: [facility] ORS;  Service: Gynecology;  Laterality: Bilateral;   TUBAL LIGATION     Patient Active Problem List   Diagnosis Date Noted   Impacted cerumen of right ear 04/13/2023   Chronic pain of multiple joints 04/13/2023   High risk medication use 12/11/2022   Annual physical exam 12/11/2022   Osteopenia 05/22/2022   Hormone replacement therapy 05/22/2022   Pain in limb 03/13/2022   Macular degeneration 12/24/2020   Cyst of ovary 05/03/2015    Foot pain 11/22/2014   Hypercholesteremia 11/20/2014   Cardiac murmur 11/20/2014   Temporary cerebral vascular dysfunction 11/20/2014   Gout attack 11/20/2014   Shortness of breath 11/20/2014   L-S radiculopathy 09/27/2014   Migraine 07/31/2014   Hypothyroidism 07/31/2014   Colon polyp 12/24/2009   Low back pain 06/28/2009   Anxiety state 10/26/2008   Esophageal stenosis 10/26/2008   Hemorrhoids, internal 10/26/2008   Menopausal and perimenopausal disorder 10/26/2008   Gastro-esophageal reflux disease without esophagitis 10/26/2008   Edema 10/26/2008   Generalized anxiety disorder 10/26/2008   Allergic rhinitis 07/13/2006    PCP: Lauraine LOISE Buoy, DO  REFERRING PROVIDER: Lauraine LOISE Buoy, DO  REFERRING DIAGNOSIS:  R26.89 (ICD-10-CM) - Balance problem    THERAPY DIAG: Imbalance  Difficulty in walking, not elsewhere classified  Muscle weakness (generalized)  ONSET DATE: April 10, 2023 FOLLOW UP APPT WITH PROVIDER: Yes ; next physical with Dr. Buoy in October    RATIONALE FOR EVALUATION AND TREATMENT: Rehabilitation  PERTINENT HISTORY: Pt is an 83 year old female referred for unsteady gait and balance impairment - PCP believes it is due to decreased activity. Pt's husband passed away in 04/10/23. She states that since he passed, she has had more weakness with not being as active. Pt reports no falls, but she intermittently runs into items by mistake. Patient  reports remote Hx of sciatic pain that has improved; episodic pain that she is not experiencing presently. Pt has intermittent chiropractic intervention for back pain that helps. Pt reports some stumbling with going up/down steps. Hx of hypothyroidism, anxiety, anemia. Pt repots walking up to 1.5 miles per day. She reports imbalance more with small steps and smaller movements with her feet.   Pain: No Numbness/Tingling: No Focal Weakness: Yes; LLE weakness Recent changes in overall health/medication: No; major life  change with passing of her husband  Prior history of physical therapy for balance:  No Falls: Has patient fallen in last 6 months? 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 Handrails in shower, non-slip floor, shower seat available.  Has following equipment at home: None   Patient Goals: LE strengthening, feel more confident with walking and going up/down steps     OBJECTIVE (data from initial evaluation unless otherwise dated):   Patient Surveys  ABC: 38.1%  GAIT: Distance walked: 160 ft Assistive device utilized: None Level of assistance: SBA Comments: Slight lateral shift to R side and forward flexed posture throughout gait cycle. Sound heel strike, but diminished step length/stride length. Mild dec arm swing  Posture: FHRS, increased thoracic and lumbar kyphosis; pt able to largely self-correct posture in standing  AROM AROM (Normal range in degrees) AROM  10/19/2023      Ankle    Dorsiflexion (20) WNL WNL  Plantarflexion (50) WNL WNL  Inversion (35)    Eversion (15    (* = pain; Blank rows = not tested)   LE MMT: MMT (out of 5) Right 10/19/2023 Left 10/19/2023  Hip flexion 4- 4-  Hip extension    Hip abduction (seated) 4+ 4+  Hip adduction (seated) 5 5  Hip internal rotation    Hip external rotation    Knee flexion 5 4+   Knee extension 5 4+  Ankle dorsiflexion 5 5  Ankle plantarflexion 4+ 4  Ankle inversion    Ankle eversion    (* = pain; Blank rows = not tested)  Coordination/Cerebellar Finger to Nose: WNL Heel to Shin: WNL Rapid alternating movements: WNL Finger Opposition: WNL Pronator Drift: Negative  Clinical Test of Sensory Interaction for Balance    (CTSIB):  CONDITION TIME STRATEGY SWAY  Eyes open, firm surface 30 seconds ankle   Eyes closed, firm surface 30 seconds ankle Increased anterior/posterior sway  Eyes open, foam surface 30 seconds ankle Increased fasciculations and sway  Eyes closed, foam surface 14 seconds Ankle, hip     FUNCTIONAL OUTCOME MEASURES   Results Comments  BERG 48/56   DGI 19/24   TUG 11.5 seconds Duration below fall risk cut-off  5TSTS 17 seconds Fall risk, in need of intervention  (Blank rows = not tested)     TODAY'S TREATMENT    11/11/2023   SUBJECTIVE STATEMENT:   Patient reports doing well after last visit - no notable muscle soreness. She reports no  falls/near-falls or safety concerns since earlier this week.    Therapeutic Exercise - improved strength as needed to improve performance of CKC activities/functional movements and as needed for power production to prevent fall during episode of large postural perturbation  NuStep; Level 4-5, x 5 minutes - for improved soft tissue mobility and increased tissue temperature to improve muscle performance   -subjective gathered during this time  Forward step-up to hurdle stance; 2 x 10 on 6-inch step, center of gym  PATIENT EDUCATION: Reviewed HEP and discussed expected progression of PT.     Neuromuscular Re-education - for improved sensory integration, static and dynamic postural control, equilibrium and non-equilibrium coordination as needed for negotiating home and community environment and stepping over obstacles   Obstacle course in hallway: 2 Airex pads step up/down, step over long Airex  (horizontally oriented), alternating lateral cone taps (4 on R and L); x2 D/B  Balance clock (calling out 12/6/9/3 o'clock) 90-degree Airex step up, forward step-over, retro step; x 4 minutes  Toe tapping, 2x10, 12-inch step, with Airex   Standing on Airex with head turns, vertical/horizontal; x 20 for each  Alternating forward bounce off of wall with rainbow ball, alternating to L and R wall in hallway; x 20 alternating    *not today*   Hurdle stepping, mixed height, 6 and 12-inch hurdles; 4x DB in // bars  High knees with opposite knee touch; x3 D/B in // bars Semitandem stance; 2 x 30 sec, bilat   Dynamic march/high knees in // bars; 5x D/B   PATIENT EDUCATION:  Education details: see above for patient education details Person educated: Patient Education method: Explanation, Demonstration, and Handouts Education comprehension: verbalized understanding and returned demonstration   HOME EXERCISE PROGRAM: Access Code: [email or web address] URL: [email or web address] Date: 10/27/2023 Prepared by: Venetia Endo  Exercises - Sit to Stand Without Arm Support  - 2 x daily - 7 x weekly - 2 sets - 10 reps - Standing March with Counter Support  - 2 x daily - 7 x weekly - 2 sets - 10 reps - Heel Raises with Counter Support  - 2 x daily - 7 x weekly - 2 sets - 10 reps - Forward Step Touch  - 2 x daily - 7 x weekly - 2 sets - 10 reps - Standing Romberg to 1/2 Tandem Stance  - 2 x daily - 7 x weekly - 3 sets - 20-30sec hold   ASSESSMENT:  CLINICAL IMPRESSION: Patient  feels that looking both R and L when going out to mailbox to get her mail is less challenging, and she reports fair progress to date. Pt participates very well with PT, and we are able to continue with higher volume of advanced balance training and obstacle negotiation work. Pt will benefit from further progression of postural control and balance work to improve fall risk and confidence with functional mobility. Patient  will benefit from skilled PT to address remaining deficits and improve overall function.  REHAB POTENTIAL: Good  CLINICAL DECISION MAKING: Evolving/moderate complexity  EVALUATION COMPLEXITY: Moderate   GOALS: Goals reviewed with patient? Yes  SHORT TERM GOALS: Target date: 11/09/2023  Pt will be independent with HEP in order to improve strength and balance in order to decrease fall risk and improve function at home. Baseline: 10/19/23: Baseline home exercises given, to be updated over next 2-3 weeks. Goal status: INITIAL   LONG TERM GOALS: Target date: 12/16/2023  Pt will negotiate full flight of steps with unilateral handrail support with  reciprocal pattern IND with no notable LOB or gait deviations. Baseline: 10/19/23: Perceived difficulty and concern with stair safety.  Goal status: INITIAL  2.  Pt will improve BERG by at least 3 points in order to demonstrate clinically significant improvement in balance.   Baseline: 10/19/23: To be completed on visit # 2.       10/27/23: 48/56 Goal status: INITIAL  3.  Pt will improve ABC by at least 13% in order to demonstrate clinically significant improvement in balance confidence.      Baseline: 10/19/23: 38.1% Goal status: INITIAL  4. Pt will decrease 5TSTS to less than 12 sec in order to demonstrate clinically significant improvement in LE strength and being superior to fall risk cut-off score Baseline: 10/19/23: 17 sec Goal status: INITIAL  5. Pt will improve DGI by at least 3 points in order to demonstrate clinically significant improvement in balance and decreased risk for falls.     Baseline: 10/19/23: To be completed on visit # 2.    10/27/23: 19/24 Goal status: INITIAL     PLAN: PT FREQUENCY: 2x/week  PT DURATION: 8 weeks  PLANNED INTERVENTIONS: Therapeutic exercises, Therapeutic activity, Neuromuscular re-education, Balance training, Gait training, Patient education, Self-care/Home management  PLAN FOR NEXT SESSION:  LE  strengthening and postural control training. Exercises integrating head turns and 90/180-degree changes in body position. Test VOR/head thrust as indicated.    Venetia Endo, PT, DPT #[id number]  Venetia ONEIDA Endo 11/11/2023, 1:45 PM

## 2023-11-14 ENCOUNTER — Encounter: Payer: Self-pay | Admitting: Physical Therapy

## 2023-11-15 ENCOUNTER — Ambulatory Visit: Admitting: Physical Therapy

## 2023-11-15 DIAGNOSIS — R2689 Other abnormalities of gait and mobility: Secondary | ICD-10-CM | POA: Diagnosis not present

## 2023-11-15 DIAGNOSIS — M6281 Muscle weakness (generalized): Secondary | ICD-10-CM

## 2023-11-15 DIAGNOSIS — R262 Difficulty in walking, not elsewhere classified: Secondary | ICD-10-CM

## 2023-11-15 NOTE — Therapy (Signed)
 OUTPATIENT PHYSICAL THERAPY BALANCE TREATMENT   Patient Name: Jamie [name] MRN: [id number] DOB:[birth year], 83 y.o., female Today's Date: 11/15/2023    PT End of Session - 11/15/23 1244     Visit Number 7    Number of Visits 17    Date for Recertification  12/16/23    Authorization Type HTA [id number]    PT Start Time 1244    PT Stop Time 1325    PT Time Calculation (min) 41 min    Equipment Utilized During Treatment Gait belt    Activity Tolerance Patient tolerated treatment well    Behavior During Therapy WFL for tasks assessed/performed            Past Medical History:  Diagnosis Date   Anemia    Anxiety    Arthritis    Blood transfusion without reported diagnosis    Cataract 2014   GERD (gastroesophageal reflux disease)    Headache    Hypertension    patient denies   Hypothyroidism    Macular degeneration    Ovarian cyst, left 2021   PONV (postoperative nausea and vomiting)    Past Surgical History:  Procedure Laterality Date   ABDOMINAL HYSTERECTOMY  1980   APPENDECTOMY     CATARACT EXTRACTION Bilateral 2014   CHOLECYSTECTOMY     COLONOSCOPY WITH PROPOFOL  N/A 03/22/2015   Procedure: COLONOSCOPY WITH PROPOFOL ;  Surgeon: Lamar ONEIDA Holmes, MD;  Location: [facility] ENDOSCOPY;  Service: Endoscopy;  Laterality: N/A;   EYE SURGERY     FRACTURE SURGERY  1989   LAPAROSCOPIC BILATERAL SALPINGO OOPHERECTOMY Bilateral 09/11/2019   Procedure: LAPAROSCOPIC BILATERAL SALPINGO OOPHORECTOMY;  Surgeon: Schermerhorn, Debby PARAS, MD;  Location: [facility] ORS;  Service: Gynecology;  Laterality: Bilateral;   TUBAL LIGATION     Patient Active Problem List   Diagnosis Date Noted   Impacted cerumen of right ear 04/13/2023   Chronic pain of multiple joints 04/13/2023   High risk medication use 12/11/2022   Annual physical exam 12/11/2022   Osteopenia 05/22/2022   Hormone replacement therapy 05/22/2022   Pain in limb 03/13/2022   Macular degeneration 12/24/2020   Cyst of ovary  05/03/2015   Foot pain 11/22/2014   Hypercholesteremia 11/20/2014   Cardiac murmur 11/20/2014   Temporary cerebral vascular dysfunction 11/20/2014   Gout attack 11/20/2014   Shortness of breath 11/20/2014   L-S radiculopathy 09/27/2014   Migraine 07/31/2014   Hypothyroidism 07/31/2014   Colon polyp 12/24/2009   Low back pain 06/28/2009   Anxiety state 10/26/2008   Esophageal stenosis 10/26/2008   Hemorrhoids, internal 10/26/2008   Menopausal and perimenopausal disorder 10/26/2008   Gastro-esophageal reflux disease without esophagitis 10/26/2008   Edema 10/26/2008   Generalized anxiety disorder 10/26/2008   Allergic rhinitis 07/13/2006    PCP: Lauraine LOISE Buoy, DO  REFERRING PROVIDER: Lauraine LOISE Buoy, DO  REFERRING DIAGNOSIS:  R26.89 (ICD-10-CM) - Balance problem    THERAPY DIAG: Imbalance  Difficulty in walking, not elsewhere classified  Muscle weakness (generalized)  ONSET DATE: 04/04/2023 FOLLOW UP APPT WITH PROVIDER: Yes ; next physical with Dr. Buoy in October    RATIONALE FOR EVALUATION AND TREATMENT: Rehabilitation  PERTINENT HISTORY: Pt is an 83 year old female referred for unsteady gait and balance impairment - PCP believes it is due to decreased activity. Pt's husband passed away in Apr 04, 2023. She states that since he passed, she has had more weakness with not being as active. Pt reports no falls, but she intermittently runs into items by mistake.  Patient reports remote Hx of sciatic pain that has improved; episodic pain that she is not experiencing presently. Pt has intermittent chiropractic intervention for back pain that helps. Pt reports some stumbling with going up/down steps. Hx of hypothyroidism, anxiety, anemia. Pt repots walking up to 1.5 miles per day. She reports imbalance more with small steps and smaller movements with her feet.   Pain: No Numbness/Tingling: No Focal Weakness: Yes; LLE weakness Recent changes in overall health/medication: No;  major life change with passing of her husband  Prior history of physical therapy for balance:  No Falls: Has patient fallen in last 6 months? 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 Handrails in shower, non-slip floor, shower seat available.  Has following equipment at home: None   Patient Goals: LE strengthening, feel more confident with walking and going up/down steps     OBJECTIVE (data from initial evaluation unless otherwise dated):   Patient Surveys  ABC: 38.1%  GAIT: Distance walked: 160 ft Assistive device utilized: None Level of assistance: SBA Comments: Slight lateral shift to R side and forward flexed posture throughout gait cycle. Sound heel strike, but diminished step length/stride length. Mild dec arm swing  Posture: FHRS, increased thoracic and lumbar kyphosis; pt able to largely self-correct posture in standing  AROM AROM (Normal range in degrees) AROM  10/19/2023      Ankle    Dorsiflexion (20) WNL WNL  Plantarflexion (50) WNL WNL  Inversion (35)    Eversion (15    (* = pain; Blank rows = not tested)   LE MMT: MMT (out of 5) Right 10/19/2023 Left 10/19/2023  Hip flexion 4- 4-  Hip extension    Hip abduction (seated) 4+ 4+  Hip adduction (seated) 5 5  Hip internal rotation    Hip external rotation    Knee  flexion 5 4+  Knee extension 5 4+  Ankle dorsiflexion 5 5  Ankle plantarflexion 4+ 4  Ankle inversion    Ankle eversion    (* = pain; Blank rows = not tested)  Coordination/Cerebellar Finger to Nose: WNL Heel to Shin: WNL Rapid alternating movements: WNL Finger Opposition: WNL Pronator Drift: Negative  Clinical Test of Sensory Interaction for Balance    (CTSIB):  CONDITION TIME STRATEGY SWAY  Eyes open, firm surface 30 seconds ankle   Eyes closed, firm surface 30 seconds ankle Increased anterior/posterior sway  Eyes open, foam surface 30 seconds ankle Increased fasciculations and sway  Eyes closed, foam surface 14 seconds Ankle, hip     FUNCTIONAL OUTCOME MEASURES   Results Comments  BERG 48/56   DGI 19/24   TUG 11.5 seconds Duration below fall risk cut-off  5TSTS 17 seconds Fall risk, in need of intervention  (Blank rows = not tested)     TODAY'S TREATMENT    11/15/2023   SUBJECTIVE STATEMENT:   Patient reports no recent safety issues or near-falls lately. Patient reports no notable soreness  or issue after last visit.    Therapeutic Exercise - improved strength as needed to improve performance of CKC activities/functional movements and as needed for power production to prevent fall during episode of large postural perturbation  NuStep; Level 5, x 5 minutes - for improved soft tissue mobility and increased tissue temperature to improve muscle performance   -subjective gathered during this time  -treatment area prepared   Forward step-up to hurdle stance; 2 x 10 on 6-inch step, center of gym  PATIENT EDUCATION: Reviewed HEP and discussed expected progression of PT.     Neuromuscular Re-education - for improved sensory integration, static and dynamic postural control, equilibrium and non-equilibrium coordination as needed for negotiating home and community environment and stepping over obstacles   Obstacle course in hallway: 2 Airex pads step up/down, step over  long Airex (horizontally oriented), alternating lateral cone taps (4 on R and L); x2 D/B  Balance clock (calling out 12/6/9/3 o'clock) 90-degree Airex step up, forward step-over, retro step; x 4 minutes  Toe tapping, 2x10, 12-inch step, with pt standing on Airex  Forward gait in wall with ball pass side-to-side with PT; 3x D/B hallway length  Alternating bounce off of wall with rainbow ball, alternating to L and R wall in hallway; x 20 alternating  Standing on Airex with head turns, vertical/horizontal; x 20 for each  -FT next visit      *not today*   Hurdle stepping, mixed height, 6 and 12-inch hurdles; 4x DB in // bars  High knees with opposite knee touch; x3 D/B in // bars Semitandem stance; 2 x 30 sec, bilat   Dynamic march/high knees in // bars; 5x D/B   PATIENT EDUCATION:  Education details: see above for patient education details Person educated: Patient Education method: Explanation, Demonstration, and Handouts Education comprehension: verbalized understanding and returned demonstration   HOME EXERCISE PROGRAM: Access Code: [email or web address] URL: [email or web address] Date: 10/27/2023 Prepared by: Venetia Endo  Exercises - Sit to Stand Without Arm Support  - 2 x daily - 7 x weekly - 2 sets - 10 reps - Standing March with Counter Support  - 2 x daily - 7 x weekly - 2 sets - 10 reps - Heel Raises with Counter Support  - 2 x daily - 7 x weekly - 2 sets - 10 reps - Forward Step Touch  - 2 x daily - 7 x weekly - 2 sets - 10 reps - Standing Romberg to 1/2 Tandem Stance  - 2 x daily - 7 x weekly - 3 sets - 20-30sec hold   ASSESSMENT:  CLINICAL IMPRESSION: Patient is most challenged by tasks requiring brief unipedal stance on LLE; pt is able to complete obstacle negotiation and stepping up drills relatively well with minimal LOB. Pt exhibits notably improved postural control as exhibited by no LOB with head turns on compliant surface. Pt will benefit from further  progression of postural control and balance work to improve fall risk and confidence with functional mobility. Patient will benefit from skilled PT to address remaining deficits and improve overall function.  REHAB POTENTIAL: Good  CLINICAL DECISION MAKING: Evolving/moderate complexity  EVALUATION COMPLEXITY: Moderate   GOALS: Goals reviewed with patient? Yes  SHORT TERM GOALS: Target date: 11/09/2023  Pt will be independent with HEP in order to improve strength and balance in order to decrease fall risk and improve function at home. Baseline: 10/19/23: Baseline home exercises given, to be updated over next 2-3 weeks. Goal status: INITIAL   LONG  TERM GOALS: Target date: 12/16/2023  Pt will negotiate full flight of steps with unilateral handrail support with reciprocal pattern IND with no notable LOB or gait deviations. Baseline: 10/19/23: Perceived difficulty and concern with stair safety.  Goal status: INITIAL  2.  Pt will improve BERG by at least 3 points in order to demonstrate clinically significant improvement in balance.   Baseline: 10/19/23: To be completed on visit # 2.       10/27/23: 48/56 Goal status: INITIAL  3.  Pt will improve ABC by at least 13% in order to demonstrate clinically significant improvement in balance confidence.      Baseline: 10/19/23: 38.1% Goal status: INITIAL  4. Pt will decrease 5TSTS to less than 12 sec in order to demonstrate clinically significant improvement in LE strength and being superior to fall risk cut-off score Baseline: 10/19/23: 17 sec Goal status: INITIAL  5. Pt will improve DGI by at least 3 points in order to demonstrate clinically significant improvement in balance and decreased risk for falls.     Baseline: 10/19/23: To be completed on visit # 2.    10/27/23: 19/24 Goal status: INITIAL     PLAN: PT FREQUENCY: 2x/week  PT DURATION: 8 weeks  PLANNED INTERVENTIONS: Therapeutic exercises, Therapeutic activity, Neuromuscular  re-education, Balance training, Gait training, Patient education, Self-care/Home management  PLAN FOR NEXT SESSION:  LE strengthening and postural control training. Exercises integrating head turns and 90/180-degree changes in body position. Test VOR/head thrust as indicated.    Venetia Endo, PT, DPT #[id number]  Venetia ONEIDA Endo 11/15/2023, 12:46 PM

## 2023-11-17 ENCOUNTER — Encounter: Payer: Self-pay | Admitting: Physical Therapy

## 2023-11-17 ENCOUNTER — Ambulatory Visit: Admitting: Physical Therapy

## 2023-11-17 DIAGNOSIS — R2689 Other abnormalities of gait and mobility: Secondary | ICD-10-CM | POA: Diagnosis not present

## 2023-11-17 DIAGNOSIS — R262 Difficulty in walking, not elsewhere classified: Secondary | ICD-10-CM

## 2023-11-17 DIAGNOSIS — M6281 Muscle weakness (generalized): Secondary | ICD-10-CM

## 2023-11-17 NOTE — Therapy (Signed)
 OUTPATIENT PHYSICAL THERAPY BALANCE TREATMENT   Patient Name: Jamie [name] MRN: [id number] DOB:19-Nov-1940, 83 y.o., female Today's Date: 11/17/2023    PT End of Session - 11/17/23 1338     Visit Number 8    Number of Visits 17    Date for Recertification  12/16/23    Authorization Type HTA [id number]    PT Start Time 1333    PT Stop Time 1414    PT Time Calculation (min) 41 min    Equipment Utilized During Treatment Gait belt    Activity Tolerance Patient tolerated treatment well    Behavior During Therapy WFL for tasks assessed/performed             Past Medical History:  Diagnosis Date   Anemia    Anxiety    Arthritis    Blood transfusion without reported diagnosis    Cataract 2014   GERD (gastroesophageal reflux disease)    Headache    Hypertension    patient denies   Hypothyroidism    Macular degeneration    Ovarian cyst, left 2021   PONV (postoperative nausea and vomiting)    Past Surgical History:  Procedure Laterality Date   ABDOMINAL HYSTERECTOMY  1980   APPENDECTOMY     CATARACT EXTRACTION Bilateral 2014   CHOLECYSTECTOMY     COLONOSCOPY WITH PROPOFOL  N/A 03/22/2015   Procedure: COLONOSCOPY WITH PROPOFOL ;  Surgeon: Lamar ONEIDA Holmes, MD;  Location: [facility] ENDOSCOPY;  Service: Endoscopy;  Laterality: N/A;   EYE SURGERY     FRACTURE SURGERY  1989   LAPAROSCOPIC BILATERAL SALPINGO OOPHERECTOMY Bilateral 09/11/2019   Procedure: LAPAROSCOPIC BILATERAL SALPINGO OOPHORECTOMY;  Surgeon: Schermerhorn, Debby PARAS, MD;  Location: [facility] ORS;  Service: Gynecology;  Laterality: Bilateral;   TUBAL LIGATION     Patient Active Problem List   Diagnosis Date Noted   Impacted cerumen of right ear 04/13/2023   Chronic pain of multiple joints 04/13/2023   High risk medication use 12/11/2022   Annual physical exam 12/11/2022   Osteopenia 05/22/2022   Hormone replacement therapy 05/22/2022   Pain in limb 03/13/2022   Macular degeneration 12/24/2020   Cyst of ovary  05/03/2015   Foot pain 11/22/2014   Hypercholesteremia 11/20/2014   Cardiac murmur 11/20/2014   Temporary cerebral vascular dysfunction 11/20/2014   Gout attack 11/20/2014   Shortness of breath 11/20/2014   L-S radiculopathy 09/27/2014   Migraine 07/31/2014   Hypothyroidism 07/31/2014   Colon polyp 12/24/2009   Low back pain 06/28/2009   Anxiety state 10/26/2008   Esophageal stenosis 10/26/2008   Hemorrhoids, internal 10/26/2008   Menopausal and perimenopausal disorder 10/26/2008   Gastro-esophageal reflux disease without esophagitis 10/26/2008   Edema 10/26/2008   Generalized anxiety disorder 10/26/2008   Allergic rhinitis 07/13/2006    PCP: Lauraine LOISE Buoy, DO  REFERRING PROVIDER: Lauraine LOISE Buoy, DO  REFERRING DIAGNOSIS:  R26.89 (ICD-10-CM) - Balance problem    THERAPY DIAG: Imbalance  Difficulty in walking, not elsewhere classified  Muscle weakness (generalized)  ONSET DATE: 2023/03/28 FOLLOW UP APPT WITH PROVIDER: Yes ; next physical with Dr. Buoy in October    RATIONALE FOR EVALUATION AND TREATMENT: Rehabilitation  PERTINENT HISTORY: Pt is an 83 year old female referred for unsteady gait and balance impairment - PCP believes it is due to decreased activity. Pt's husband passed away in 2023/03/28. She states that since he passed, she has had more weakness with not being as active. Pt reports no falls, but she intermittently runs into items by  mistake. Patient reports remote Hx of sciatic pain that has improved; episodic pain that she is not experiencing presently. Pt has intermittent chiropractic intervention for back pain that helps. Pt reports some stumbling with going up/down steps. Hx of hypothyroidism, anxiety, anemia. Pt repots walking up to 1.5 miles per day. She reports imbalance more with small steps and smaller movements with her feet.   Pain: No Numbness/Tingling: No Focal Weakness: Yes; LLE weakness Recent changes in overall health/medication: No;  major life change with passing of her husband  Prior history of physical therapy for balance:  No Falls: Has patient fallen in last 6 months? 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 Handrails in shower, non-slip floor, shower seat available.  Has following equipment at home: None   Patient Goals: LE strengthening, feel more confident with walking and going up/down steps     OBJECTIVE (data from initial evaluation unless otherwise dated):   Patient Surveys  ABC: 38.1%  GAIT: Distance walked: 160 ft Assistive device utilized: None Level of assistance: SBA Comments: Slight lateral shift to R side and forward flexed posture throughout gait cycle. Sound heel strike, but diminished step length/stride length. Mild dec arm swing  Posture: FHRS, increased thoracic and lumbar kyphosis; pt able to largely self-correct posture in standing  AROM AROM (Normal range in degrees) AROM  10/19/2023      Ankle    Dorsiflexion (20) WNL WNL  Plantarflexion (50) WNL WNL  Inversion (35)    Eversion (15    (* = pain; Blank rows = not tested)   LE MMT: MMT (out of 5) Right 10/19/2023 Left 10/19/2023  Hip flexion 4- 4-  Hip extension    Hip abduction (seated) 4+ 4+  Hip adduction (seated) 5 5  Hip internal rotation    Hip external rotation    Knee  flexion 5 4+  Knee extension 5 4+  Ankle dorsiflexion 5 5  Ankle plantarflexion 4+ 4  Ankle inversion    Ankle eversion    (* = pain; Blank rows = not tested)  Coordination/Cerebellar Finger to Nose: WNL Heel to Shin: WNL Rapid alternating movements: WNL Finger Opposition: WNL Pronator Drift: Negative  Clinical Test of Sensory Interaction for Balance    (CTSIB):  CONDITION TIME STRATEGY SWAY  Eyes open, firm surface 30 seconds ankle   Eyes closed, firm surface 30 seconds ankle Increased anterior/posterior sway  Eyes open, foam surface 30 seconds ankle Increased fasciculations and sway  Eyes closed, foam surface 14 seconds Ankle, hip     FUNCTIONAL OUTCOME MEASURES   Results Comments  BERG 48/56   DGI 19/24   TUG 11.5 seconds Duration below fall risk cut-off  5TSTS 17 seconds Fall risk, in need of intervention  (Blank rows = not tested)     TODAY'S TREATMENT    11/17/2023   SUBJECTIVE STATEMENT:   Patient reports episode notable back pain Monday evening and Tuesday. She feels that  resistance on NuStep was higher and made it hard to move her LLE last visit. She reports using ice and heat at home.    Therapeutic Exercise - improved strength as needed to improve performance of CKC activities/functional movements and as needed for power production to prevent fall during episode of large postural perturbation  NuStep; Level 3, x 5.5 minutes - for improved soft tissue mobility and increased tissue temperature to improve muscle performance   -subjective gathered during this time   -MHP along low back during active warm-up   PATIENT EDUCATION: Reviewed HEP and discussed expected progression of PT.    *not today* Forward step-up to hurdle stance; 2 x 10 on 6-inch step, center of gym   Neuromuscular Re-education - for improved sensory integration, static and dynamic postural control, equilibrium and non-equilibrium coordination as needed for negotiating home and community  environment and stepping over obstacles   Obstacle course in hallway: 2 Airex pads step up/down, step over long Airex (horizontally oriented), alternating lateral cone taps (4 on R and L); x2 D/B  Balance clock (calling out 12/6/9/3 o'clock) 90-degree Airex step up, forward step-over, retro step; x 4 minutes  Toe tapping, 2x10, 12-inch step, with pt standing on Airex  Forward gait in wall with ball pass side-to-side with PT; 3x D/B hallway length  Alternating bounce off of wall with rainbow ball, alternating to L and R wall in hallway; x 20 alternating  Standing on Airex with head turns, vertical/horizontal; x 20 for each  -FT next visit      *not today*   Hurdle stepping, mixed height, 6 and 12-inch hurdles; 4x DB in // bars  High knees with opposite knee touch; x3 D/B in // bars Semitandem stance; 2 x 30 sec, bilat   Dynamic march/high knees in // bars; 5x D/B   PATIENT EDUCATION:  Education details: see above for patient education details Person educated: Patient Education method: Explanation, Demonstration, and Handouts Education comprehension: verbalized understanding and returned demonstration   HOME EXERCISE PROGRAM: Access Code: [email or web address] URL: [email or web address] Date: 10/27/2023 Prepared by: Venetia Endo  Exercises - Sit to Stand Without Arm Support  - 2 x daily - 7 x weekly - 2 sets - 10 reps - Standing March with Counter Support  - 2 x daily - 7 x weekly - 2 sets - 10 reps - Heel Raises with Counter Support  - 2 x daily - 7 x weekly - 2 sets - 10 reps - Forward Step Touch  - 2 x daily - 7 x weekly - 2 sets - 10 reps - Standing Romberg to 1/2 Tandem Stance  - 2 x daily - 7 x weekly - 3 sets - 20-30sec hold   ASSESSMENT:  CLINICAL IMPRESSION: Patient has good short-term response with use of moist heat during NuStep and decreased load with cycling. Patient is able to continue with advanced balance training and obstacle negotiation training  without notable back pain and reports feeling generally well at end of session today. Pt will benefit from further progression of postural control and balance work to improve fall risk and confidence with functional mobility. Patient will benefit from skilled PT to address remaining deficits and improve overall function.  REHAB POTENTIAL: Good  CLINICAL DECISION MAKING: Evolving/moderate complexity  EVALUATION COMPLEXITY: Moderate   GOALS: Goals reviewed with patient? Yes  SHORT TERM GOALS: Target date: 11/09/2023  Pt will be independent with HEP in order to improve strength and balance in order to decrease fall risk  and improve function at home. Baseline: 10/19/23: Baseline home exercises given, to be updated over next 2-3 weeks. Goal status: INITIAL   LONG TERM GOALS: Target date: 12/16/2023  Pt will negotiate full flight of steps with unilateral handrail support with reciprocal pattern IND with no notable LOB or gait deviations. Baseline: 10/19/23: Perceived difficulty and concern with stair safety.  Goal status: INITIAL  2.  Pt will improve BERG by at least 3 points in order to demonstrate clinically significant improvement in balance.   Baseline: 10/19/23: To be completed on visit # 2.       10/27/23: 48/56 Goal status: INITIAL  3.  Pt will improve ABC by at least 13% in order to demonstrate clinically significant improvement in balance confidence.      Baseline: 10/19/23: 38.1% Goal status: INITIAL  4. Pt will decrease 5TSTS to less than 12 sec in order to demonstrate clinically significant improvement in LE strength and being superior to fall risk cut-off score Baseline: 10/19/23: 17 sec Goal status: INITIAL  5. Pt will improve DGI by at least 3 points in order to demonstrate clinically significant improvement in balance and decreased risk for falls.     Baseline: 10/19/23: To be completed on visit # 2.    10/27/23: 19/24 Goal status: INITIAL     PLAN: PT FREQUENCY:  2x/week  PT DURATION: 8 weeks  PLANNED INTERVENTIONS: Therapeutic exercises, Therapeutic activity, Neuromuscular re-education, Balance training, Gait training, Patient education, Self-care/Home management  PLAN FOR NEXT SESSION:  LE strengthening and postural control training. Exercises integrating head turns and 90/180-degree changes in body position. Test VOR/head thrust as indicated.    Venetia Endo, PT, DPT #[id number]  Venetia ONEIDA Endo 11/17/2023, 1:38 PM

## 2023-11-19 DIAGNOSIS — M9905 Segmental and somatic dysfunction of pelvic region: Secondary | ICD-10-CM | POA: Diagnosis not present

## 2023-11-19 DIAGNOSIS — M5432 Sciatica, left side: Secondary | ICD-10-CM | POA: Diagnosis not present

## 2023-11-19 DIAGNOSIS — M9903 Segmental and somatic dysfunction of lumbar region: Secondary | ICD-10-CM | POA: Diagnosis not present

## 2023-11-19 DIAGNOSIS — M545 Low back pain, unspecified: Secondary | ICD-10-CM | POA: Diagnosis not present

## 2023-11-22 ENCOUNTER — Ambulatory Visit: Admitting: Physical Therapy

## 2023-11-24 ENCOUNTER — Ambulatory Visit: Attending: Family Medicine | Admitting: Physical Therapy

## 2023-11-24 ENCOUNTER — Encounter: Payer: Self-pay | Admitting: Physical Therapy

## 2023-11-24 DIAGNOSIS — R262 Difficulty in walking, not elsewhere classified: Secondary | ICD-10-CM | POA: Diagnosis not present

## 2023-11-24 DIAGNOSIS — R2689 Other abnormalities of gait and mobility: Secondary | ICD-10-CM | POA: Diagnosis not present

## 2023-11-24 DIAGNOSIS — M6281 Muscle weakness (generalized): Secondary | ICD-10-CM | POA: Insufficient documentation

## 2023-11-24 NOTE — Therapy (Signed)
 OUTPATIENT PHYSICAL THERAPY BALANCE TREATMENT   Patient Name: Jamie [name] MRN: [id number] DOB:05-30-1940, 83 y.o., female Today's Date: 11/24/2023    PT End of Session - 11/24/23 1250     Visit Number 9    Number of Visits 17    Date for Recertification  12/16/23    Authorization Type HTA [id number]    PT Start Time 1245    PT Stop Time 1325    PT Time Calculation (min) 40 min    Equipment Utilized During Treatment Gait belt    Activity Tolerance Patient tolerated treatment well    Behavior During Therapy WFL for tasks assessed/performed            Past Medical History:  Diagnosis Date   Anemia    Anxiety    Arthritis    Blood transfusion without reported diagnosis    Cataract 2014   GERD (gastroesophageal reflux disease)    Headache    Hypertension    patient denies   Hypothyroidism    Macular degeneration    Ovarian cyst, left 2021   PONV (postoperative nausea and vomiting)    Past Surgical History:  Procedure Laterality Date   ABDOMINAL HYSTERECTOMY  1980   APPENDECTOMY     CATARACT EXTRACTION Bilateral 2014   CHOLECYSTECTOMY     COLONOSCOPY WITH PROPOFOL  N/A 03/22/2015   Procedure: COLONOSCOPY WITH PROPOFOL ;  Surgeon: Jamie ONEIDA Holmes, MD;  Location: [name];  Service: Endoscopy;  Laterality: N/A;   EYE SURGERY     FRACTURE SURGERY  1989   LAPAROSCOPIC BILATERAL SALPINGO OOPHERECTOMY Bilateral 09/11/2019   Procedure: LAPAROSCOPIC BILATERAL SALPINGO OOPHORECTOMY;  Surgeon: Schermerhorn, Debby PARAS, MD;  Location: [name];  Service: Gynecology;  Laterality: Bilateral;   TUBAL LIGATION     Patient Active Problem List   Diagnosis Date Noted   Impacted cerumen of right ear 04/13/2023   Chronic [name] of multiple joints 04/13/2023   High risk medication use 12/11/2022   Annual physical exam 12/11/2022   Osteopenia 05/22/2022   Hormone replacement therapy 05/22/2022   [name] in limb 03/13/2022   Macular degeneration 12/24/2020   Cyst of ovary  05/03/2015   Foot [name] 11/22/2014   Hypercholesteremia 11/20/2014   Cardiac murmur 11/20/2014   Temporary cerebral vascular dysfunction 11/20/2014   Gout attack 11/20/2014   Shortness of breath 11/20/2014   L-S radiculopathy 09/27/2014   Migraine 07/31/2014   Hypothyroidism 07/31/2014   Colon polyp 12/24/2009   Low back [name] 06/28/2009   Anxiety state 10/26/2008   Esophageal stenosis 10/26/2008   Hemorrhoids, internal 10/26/2008   Menopausal and perimenopausal disorder 10/26/2008   Gastro-esophageal reflux disease without esophagitis 10/26/2008   Edema 10/26/2008   Generalized anxiety disorder 10/26/2008   Allergic rhinitis 07/13/2006    PCP: Jamie LOISE Buoy, DO  REFERRING PROVIDER: Lauraine LOISE Buoy, DO  REFERRING DIAGNOSIS:  R26.89 (ICD-10-CM) - Balance problem    THERAPY DIAG: Imbalance  Difficulty in walking, not elsewhere classified  Muscle weakness (generalized)  ONSET DATE: 03/31/23 FOLLOW UP APPT WITH PROVIDER: Yes ; next physical with Dr. Buoy in October    RATIONALE FOR EVALUATION AND TREATMENT: Rehabilitation  PERTINENT HISTORY: Pt is an 83 year old female referred for unsteady gait and balance impairment - PCP believes it is due to decreased activity. Pt's husband passed away in 2023-03-31. She states that since he passed, she has had more weakness with not being as active. Pt reports no falls, but she intermittently runs into items by mistake.  Patient reports remote Hx of sciatic [name] that has improved; episodic [name] that she is not experiencing presently. Pt has intermittent chiropractic intervention for back [name] that helps. Pt reports some stumbling with going up/down steps. Hx of hypothyroidism, anxiety, anemia. Pt repots walking up to 1.5 miles per day. She reports imbalance more with small steps and smaller movements with her feet.   [name]: No Numbness/Tingling: No Focal Weakness: Yes; LLE weakness Recent changes in overall health/medication: No;  major life change with passing of her husband  Prior history of physical therapy for balance:  No Falls: Has patient fallen in last 6 months? 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 Handrails in shower, non-slip floor, shower seat available.  Has following equipment at home: None   Patient Goals: LE strengthening, feel more confident with walking and going up/down steps     OBJECTIVE (data from initial evaluation unless otherwise dated):   Patient Surveys  ABC: 38.1%  GAIT: Distance walked: 160 ft Assistive device utilized: None Level of assistance: SBA Comments: Slight lateral shift to R side and forward flexed posture throughout gait cycle. Sound heel strike, but diminished step length/stride length. Mild dec arm swing  Posture: FHRS, increased thoracic and lumbar kyphosis; pt able to largely self-correct posture in standing  AROM AROM (Normal range in degrees) AROM  10/19/2023      Ankle    Dorsiflexion (20) WNL WNL  Plantarflexion (50) WNL WNL  Inversion (35)    Eversion (15    (* = [name]; Blank rows = not tested)   LE MMT: MMT (out of 5) Right 10/19/2023 Left 10/19/2023  Hip flexion 4- 4-  Hip extension    Hip abduction (seated) 4+ 4+  Hip adduction (seated) 5 5  Hip internal rotation    Hip external rotation    Knee  flexion 5 4+  Knee extension 5 4+  Ankle dorsiflexion 5 5  Ankle plantarflexion 4+ 4  Ankle inversion    Ankle eversion    (* = [name]; Blank rows = not tested)  Coordination/Cerebellar Finger to Nose: WNL Heel to Shin: WNL Rapid alternating movements: WNL Finger Opposition: WNL Pronator Drift: Negative  Clinical Test of Sensory Interaction for Balance    (CTSIB):  CONDITION TIME STRATEGY SWAY  Eyes open, firm surface 30 seconds ankle   Eyes closed, firm surface 30 seconds ankle Increased anterior/posterior sway  Eyes open, foam surface 30 seconds ankle Increased fasciculations and sway  Eyes closed, foam surface 14 seconds Ankle, hip     FUNCTIONAL OUTCOME MEASURES   Results Comments  BERG 48/56   DGI 19/24   TUG 11.5 seconds Duration below fall risk cut-off  5TSTS 17 seconds Fall risk, in need of intervention  (Blank rows = not tested)     TODAY'S TREATMENT    11/24/2023   SUBJECTIVE STATEMENT:   Patient reports no notable back [name] at this time. She goes to chiropractor as  needed for back [name] and has good resolution of symptoms with this. No recent near-falls or safety concerns.    Therapeutic Exercise - improved strength as needed to improve performance of CKC activities/functional movements and as needed for power production to prevent fall during episode of large postural perturbation  NuStep; Level 4, x 5 minutes - for improved soft tissue mobility and increased tissue temperature to improve muscle performance   -subjective gathered during this time   PATIENT EDUCATION: Reviewed HEP and discussed expected progression of PT.    *not today* Forward step-up to hurdle stance; 2 x 10 on 6-inch step, [name] of gym   Neuromuscular Re-education - for improved sensory integration, static and dynamic postural control, equilibrium and non-equilibrium coordination as needed for negotiating home and community environment and stepping over obstacles  Toe tapping, 2x10,  12-inch step, with pt standing on Airex  Obstacle course in hallway: 2 Airex pads step up/down, 3 and 6-inch hurdles, alternating lateral cone taps (4 on R and L); x3 D/B  Balance clock (calling out 12/6/9/3 o'clock) 90-degree Airex step up, forward step-over, retro step; x 4 minutes  Forward gait in gym with ball pass side-to-side with PT; 4x 30-ft course in gym  Standing on Airex with head turns, feet together; vertical/horizontal; x 20 for each        *not today* Alternating bounce off of wall with rainbow ball, alternating to L and R wall in hallway; x 20 alternating   Hurdle stepping, mixed height, 6 and 12-inch hurdles; 4x DB in // bars  High knees with opposite knee touch; x3 D/B in // bars Semitandem stance; 2 x 30 sec, bilat   Dynamic march/high knees in // bars; 5x D/B   PATIENT EDUCATION:  Education details: see above for patient education details Person educated: Patient Education method: Explanation, Demonstration, and Handouts Education comprehension: verbalized understanding and returned demonstration   HOME EXERCISE PROGRAM: Access Code: [email or web address] URL: [email or web address] Date: 10/27/2023 Prepared by: Venetia Endo  Exercises - Sit to Stand Without Arm Support  - 2 x daily - 7 x weekly - 2 sets - 10 reps - Standing March with Counter Support  - 2 x daily - 7 x weekly - 2 sets - 10 reps - Heel Raises with Counter Support  - 2 x daily - 7 x weekly - 2 sets - 10 reps - Forward Step Touch  - 2 x daily - 7 x weekly - 2 sets - 10 reps - Standing Romberg to 1/2 Tandem Stance  - 2 x daily - 7 x weekly - 3 sets - 20-30sec hold   ASSESSMENT:  CLINICAL IMPRESSION: Patient fortunately does not attest to significant lower quarter [name] at this time. She is most challenged with 12-inch hurdle stepping today, but she is able to maintain postural control with head turns on uneven ground and when negotiating other obstacles (cone tapping, Airex step  up/down). Pt will benefit from further progression of postural control and balance work to improve fall risk and confidence with functional mobility. Patient will benefit from skilled PT to address remaining deficits and improve overall function.  REHAB POTENTIAL: Good  CLINICAL DECISION MAKING: Evolving/moderate complexity  EVALUATION COMPLEXITY: Moderate   GOALS: Goals reviewed with patient? Yes  SHORT TERM GOALS: Target date: 11/09/2023  Pt will be independent with HEP in order to improve strength and balance in order to decrease fall risk and improve function at home. Baseline: 10/19/23: Baseline home exercises given, to be updated over  next 2-3 weeks. Goal status: INITIAL   LONG TERM GOALS: Target date: 12/16/2023  Pt will negotiate full flight of steps with unilateral handrail support with reciprocal pattern IND with no notable LOB or gait deviations. Baseline: 10/19/23: Perceived difficulty and concern with stair safety.  Goal status: INITIAL  2.  Pt will improve BERG by at least 3 points in order to demonstrate clinically significant improvement in balance.   Baseline: 10/19/23: To be completed on visit # 2.       10/27/23: 48/56 Goal status: INITIAL  3.  Pt will improve ABC by at least 13% in order to demonstrate clinically significant improvement in balance confidence.      Baseline: 10/19/23: 38.1% Goal status: INITIAL  4. Pt will decrease 5TSTS to less than 12 sec in order to demonstrate clinically significant improvement in LE strength and being superior to fall risk cut-off score Baseline: 10/19/23: 17 sec Goal status: INITIAL  5. Pt will improve DGI by at least 3 points in order to demonstrate clinically significant improvement in balance and decreased risk for falls.     Baseline: 10/19/23: To be completed on visit # 2.    10/27/23: 19/24 Goal status: INITIAL     PLAN: PT FREQUENCY: 2x/week  PT DURATION: 8 weeks  PLANNED INTERVENTIONS: Therapeutic exercises,  Therapeutic activity, Neuromuscular re-education, Balance training, Gait training, Patient education, Self-care/Home management  PLAN FOR NEXT SESSION:  LE strengthening and postural control training. Exercises integrating head turns and 90/180-degree changes in body position. Test VOR/head thrust as indicated.    Venetia Endo, PT, DPT #[id number]  Venetia ONEIDA Endo 11/24/2023, 12:50 PM

## 2023-11-29 ENCOUNTER — Ambulatory Visit: Admitting: Physical Therapy

## 2023-12-01 ENCOUNTER — Ambulatory Visit: Admitting: Physical Therapy

## 2023-12-06 ENCOUNTER — Encounter: Admitting: Physical Therapy

## 2023-12-08 ENCOUNTER — Encounter: Admitting: Physical Therapy

## 2023-12-13 ENCOUNTER — Encounter: Admitting: Physical Therapy

## 2023-12-15 ENCOUNTER — Encounter: Admitting: Physical Therapy

## 2023-12-15 DIAGNOSIS — H353231 Exudative age-related macular degeneration, bilateral, with active choroidal neovascularization: Secondary | ICD-10-CM | POA: Diagnosis not present

## 2023-12-20 ENCOUNTER — Encounter: Admitting: Physical Therapy

## 2023-12-22 ENCOUNTER — Encounter: Admitting: Physical Therapy

## 2023-12-23 ENCOUNTER — Ambulatory Visit (INDEPENDENT_AMBULATORY_CARE_PROVIDER_SITE_OTHER): Admitting: Family Medicine

## 2023-12-23 ENCOUNTER — Encounter: Payer: Self-pay | Admitting: Family Medicine

## 2023-12-23 VITALS — BP 126/54 | HR 68 | Resp 14 | Ht 65.0 in | Wt 135.1 lb

## 2023-12-23 DIAGNOSIS — E034 Atrophy of thyroid (acquired): Secondary | ICD-10-CM

## 2023-12-23 DIAGNOSIS — M8580 Other specified disorders of bone density and structure, unspecified site: Secondary | ICD-10-CM | POA: Diagnosis not present

## 2023-12-23 DIAGNOSIS — N182 Chronic kidney disease, stage 2 (mild): Secondary | ICD-10-CM | POA: Insufficient documentation

## 2023-12-23 DIAGNOSIS — E7800 Pure hypercholesterolemia, unspecified: Secondary | ICD-10-CM | POA: Diagnosis not present

## 2023-12-23 DIAGNOSIS — R7989 Other specified abnormal findings of blood chemistry: Secondary | ICD-10-CM | POA: Diagnosis not present

## 2023-12-23 DIAGNOSIS — N393 Stress incontinence (female) (male): Secondary | ICD-10-CM | POA: Diagnosis not present

## 2023-12-23 DIAGNOSIS — Z0000 Encounter for general adult medical examination without abnormal findings: Secondary | ICD-10-CM

## 2023-12-23 NOTE — Progress Notes (Signed)
 Complete physical exam   Patient: Jamie [name]   DOB: 11-24-1940   83 y.o. Female  MRN: [id number] Visit Date: 12/23/2023  Today's healthcare provider: LAURAINE LOISE BUOY, DO   Chief Complaint  Patient presents with   Annual Exam    Sleep pattern/hours: 7-8 hours, pattern is good. Exercise: walking 5 days a week No concerns    Subjective    Jamie [name] is a 83 y.o. female who presents today for a complete physical exam.   HPI HPI     Annual Exam    Additional comments: Sleep pattern/hours: 7-8 hours, pattern is good. Exercise: walking 5 days a week No concerns       Last edited by Wilfred Hargis RAMAN, CMA on 12/23/2023 10:31 AM.      Jamie [name] is an 83 year old female who presents for an annual physical exam with concerns of urinary leakage.  She experiences urinary leakage, particularly when consuming large amounts of water, which she tries to do daily. She has not attempted Kegel exercises to address this issue.  Her current medications include nortriptyline , meloxicam , Xanax  (half a tablet at night for sleep), rosuvastatin , Synthroid , estradiol , vitamin E, Zyrtec, esomeprazole, vitamin D , a B complex, a multivitamin, and Preservision. She attempted to taper off estradiol  but experienced severe hot flashes, especially during the summer.  She mentions experiencing 'swimmy headed' sensations when lying down, though states it is an infrequent occurence.  Her blood pressure readings at home are usually normal (systolic in the 120s). She reports no alcohol use and no recent falls.    Past Medical History:  Diagnosis Date   Anemia    Anxiety    Arthritis    Blood transfusion without reported diagnosis    Cataract 2014   GERD (gastroesophageal reflux disease)    Headache    Hypertension    patient denies   Hypothyroidism    Macular degeneration    Ovarian cyst, left 2021   PONV (postoperative nausea and vomiting)    Past Surgical History:   Procedure Laterality Date   ABDOMINAL HYSTERECTOMY  1980   APPENDECTOMY     CATARACT EXTRACTION Bilateral 2014   CHOLECYSTECTOMY     COLONOSCOPY WITH PROPOFOL  N/A 03/22/2015   Procedure: COLONOSCOPY WITH PROPOFOL ;  Surgeon: Lamar ONEIDA Holmes, MD;  Location: [facility] ENDOSCOPY;  Service: Endoscopy;  Laterality: N/A;   EYE SURGERY     FRACTURE SURGERY  1989   LAPAROSCOPIC BILATERAL SALPINGO OOPHERECTOMY Bilateral 09/11/2019   Procedure: LAPAROSCOPIC BILATERAL SALPINGO OOPHORECTOMY;  Surgeon: Schermerhorn, Debby PARAS, MD;  Location: [facility] ORS;  Service: Gynecology;  Laterality: Bilateral;   TUBAL LIGATION     Social History   Socioeconomic History   Marital status: Married    Spouse name: Not on file   Number of children: 2   Years of education: Not on file   Highest education level: 12th grade  Occupational History   Occupation: retired  Tobacco Use   Smoking status: Never   Smokeless tobacco: Never  Vaping Use   Vaping status: Never Used  Substance and Sexual Activity   Alcohol use: No   Drug use: No   Sexual activity: Not on file  Other Topics Concern   Not on file  Social History Narrative   Not on file   Social Drivers of Health   Financial Resource Strain: Low Risk  (12/21/2023)   Overall Financial Resource Strain (CARDIA)    Difficulty of Paying  Living Expenses: Not hard at all  Food Insecurity: No Food Insecurity (12/21/2023)   Hunger Vital Sign    Worried About Running Out of Food in the Last Year: Never true    Ran Out of Food in the Last Year: Never true  Transportation Needs: No Transportation Needs (12/21/2023)   PRAPARE - Administrator, Civil Service (Medical): No    Lack of Transportation (Non-Medical): No  Physical Activity: Sufficiently Active (12/21/2023)   Exercise Vital Sign    Days of Exercise per Week: 5 days    Minutes of Exercise per Session: 50 min  Stress: No Stress Concern Present (12/21/2023)   [employer] of Occupational  Health - Occupational Stress Questionnaire    Feeling of Stress: Not at all  Social Connections: Moderately Integrated (12/21/2023)   Social Connection and Isolation Panel    Frequency of Communication with Friends and Family: More than three times a week    Frequency of Social Gatherings with Friends and Family: Twice a week    Attends Religious Services: More than 4 times per year    Active Member of [employer] or Organizations: Yes    Attends Banker Meetings: More than 4 times per year    Marital Status: Widowed  Intimate Partner Violence: Not At Risk (09/21/2023)   Humiliation, Afraid, Rape, and Kick questionnaire    Fear of Current or Ex-Partner: No    Emotionally Abused: No    Physically Abused: No    Sexually Abused: No   Family Status  Relation Name Status   Mother margie Deceased at age 59       old age   Father  Deceased at age 56       MI   Sister  Alive   Brother Lamar Alive   MGM lena (Not Specified)   Neg Hx  (Not Specified)  No partnership data on file   Family History  Problem Relation Age of Onset   Hyperlipidemia Mother    Vision loss Mother    Heart attack Father    Emphysema Father    Healthy Sister    Diabetes Brother    Lymphoma Brother    Stroke Maternal Grandmother    Breast cancer Neg Hx    Allergies  Allergen Reactions   Codeine     Stomach pain   Faricimab  Itching   Penicillin V Potassium Dermatitis   Penicillins Rash    Patient Care Team: Bryleigh Ottaway N, DO as PCP - General (Family Medicine) Pa, [facility]   Medications: Outpatient Medications Prior to Visit  Medication Sig   ALPRAZolam  (XANAX ) 0.25 MG tablet Take 0.5 tablets (0.125 mg total) by mouth at bedtime as needed for anxiety.   b complex vitamins tablet Take 1 tablet by mouth daily.   cetirizine (ZYRTEC) 10 MG tablet Take 10 mg by mouth daily. As needed   Cholecalciferol (VITAMIN D ) 50 MCG (2000 UT) tablet Take 2,000 Units by mouth daily.    esomeprazole (NEXIUM) 20 MG capsule Take 20 mg by mouth daily.   estradiol  (ESTRACE ) 0.5 MG tablet TAKE 1 TABLET BY MOUTH EVERY DAY   meloxicam  (MOBIC ) 7.5 MG tablet TAKE 1 TABLET BY MOUTH EVERY DAY AS NEEDED   MULTIPLE VITAMIN PO Take 1 tablet by mouth daily.    Multiple Vitamins-Minerals (PRESERVISION AREDS 2 PO) Take 1 tablet by mouth 2 (two) times daily.   nortriptyline  (PAMELOR ) 50 MG capsule TAKE 1 CAPSULE BY MOUTH EVERYDAY  AT BEDTIME   rosuvastatin  (CRESTOR ) 5 MG tablet TAKE 1 TABLET (5 MG TOTAL) BY MOUTH DAILY.   SYNTHROID  137 MCG tablet TAKE 1 TABLET BY MOUTH EVERY DAY BEFORE BREAKFAST   vitamin E 1000 UNIT capsule Take 1,000 Units by mouth daily.   [DISCONTINUED] Acetaminophen -Caffeine (TENSION HEADACHE) 500-65 MG TABS Take 2 tablets by mouth daily as needed (headaches).   No facility-administered medications prior to visit.    Review of Systems  Constitutional:  Negative for chills, fatigue and fever.  HENT:  Negative for congestion, ear pain, rhinorrhea, sneezing and sore throat.   Eyes: Negative.  Negative for pain and redness.  Respiratory:  Negative for cough, shortness of breath and wheezing.   Cardiovascular:  Negative for chest pain and leg swelling.  Gastrointestinal:  Negative for abdominal pain, blood in stool, constipation, diarrhea and nausea.  Endocrine: Negative for polydipsia and polyphagia.  Genitourinary: Negative.  Negative for dysuria, flank pain, hematuria, pelvic pain, vaginal bleeding and vaginal discharge.       +mild stress incontinence  Musculoskeletal:  Negative for arthralgias, back pain, gait problem and joint swelling.  Skin:  Negative for rash.  Neurological: Negative.  Negative for dizziness, tremors, seizures, weakness, light-headedness, numbness and headaches.  Hematological:  Negative for adenopathy.  Psychiatric/Behavioral: Negative.  Negative for behavioral problems, confusion and dysphoric mood. The patient is not nervous/anxious and is not  hyperactive.       Objective    BP (!) 126/54 (BP Location: Left Arm, Patient Position: Sitting, Cuff Size: Normal)   Pulse 68   Resp 14   Ht 5' 5 (1.651 m)   Wt 135 lb 1.6 oz (61.3 kg)   SpO2 100%   BMI 22.48 kg/m    Physical Exam Vitals and nursing note reviewed.  Constitutional:      General: She is awake.     Appearance: Normal appearance.  HENT:     Head: Normocephalic and atraumatic.     Right Ear: Tympanic membrane, ear canal and external ear normal.     Left Ear: Tympanic membrane, ear canal and external ear normal.     Nose: Nose normal.     Mouth/Throat:     Mouth: Mucous membranes are moist.     Pharynx: Oropharynx is clear. No oropharyngeal exudate or posterior oropharyngeal erythema.  Eyes:     General: No scleral icterus.    Extraocular Movements: Extraocular movements intact.     Conjunctiva/sclera: Conjunctivae normal.     Pupils: Pupils are equal, round, and reactive to light.  Neck:     Thyroid : No thyromegaly or thyroid  tenderness.  Cardiovascular:     Rate and Rhythm: Normal rate and regular rhythm.     Pulses: Normal pulses.     Heart sounds: Normal heart sounds.  Pulmonary:     Effort: Pulmonary effort is normal. No tachypnea, bradypnea or respiratory distress.     Breath sounds: Normal breath sounds. No stridor. No wheezing, rhonchi or rales.  Abdominal:     General: Bowel sounds are normal. There is no distension.     Palpations: Abdomen is soft. There is no mass.     Tenderness: There is no abdominal tenderness. There is no guarding.     Hernia: No hernia is present.  Musculoskeletal:     Cervical back: Normal range of motion and neck supple.     Right lower leg: No edema.     Left lower leg: No edema.  Lymphadenopathy:  Cervical: No cervical adenopathy.  Skin:    General: Skin is warm and dry.  Neurological:     Mental Status: She is alert and oriented to person, place, and time. Mental status is at baseline.  Psychiatric:         Mood and Affect: Mood normal.        Behavior: Behavior normal.       Last depression screening scores    09/21/2023    2:38 PM 04/13/2023    1:00 PM 12/11/2022    3:56 PM  PHQ 2/9 Scores  PHQ - 2 Score 0 0 0  PHQ- 9 Score 0 1    Last fall risk screening    12/23/2023   10:34 AM  Fall Risk   Falls in the past year? 0  Number falls in past yr: 0  Injury with Fall? 0  Risk for fall due to : No Fall Risks  Follow up Falls evaluation completed   Last Audit-C alcohol use screening    12/21/2023   11:21 AM  Alcohol Use Disorder Test (AUDIT)  1. How often do you have a drink containing alcohol? 0  3. How often do you have six or more drinks on one occasion? 0   A score of 3 or more in women, and 4 or more in men indicates increased risk for alcohol abuse, EXCEPT if all of the points are from question 1   No results found for any visits on 12/23/23.  Assessment & Plan    Routine Health Maintenance and Physical Exam  Exercise Activities and Dietary recommendations  Goals      DIET - EAT MORE FRUITS AND VEGETABLES     DIET - INCREASE WATER INTAKE     Recommend increasing water intake to 6-8 glasses a day.         Immunization History  Administered Date(s) Administered   Fluad Quad(high Dose 65+) 12/24/2020   INFLUENZA, HIGH DOSE SEASONAL PF 01/03/2015, 12/12/2015, 12/03/2016, 12/15/2017, 01/01/2023, 12/09/2023   Influenza-Unspecified 10/24/2013, 11/11/2018, 12/09/2021   PFIZER(Purple Top)SARS-COV-2 Vaccination 04/26/2019, 05/17/2019, 12/21/2019   Pneumococcal Conjugate-13 01/10/2014   Pneumococcal Polysaccharide-23 05/15/2010    Health Maintenance  Topic Date Due   DEXA SCAN  03/11/2022   Zoster Vaccines- Shingrix (1 of 2) 12/23/2023 (Originally 01/17/1960)   COVID-19 Vaccine (4 - 2025-26 season) 02/24/2024 (Originally 10/25/2023)   DTaP/Tdap/Td (1 - Tdap) 04/12/2024 (Originally 01/17/1960)   Mammogram  05/20/2024   Medicare Annual Wellness (AWV)  09/20/2024    Pneumococcal Vaccine: 50+ Years  Completed   Influenza Vaccine  Completed   Meningococcal B Vaccine  Aged Out    Discussed health benefits of physical activity, and encouraged her to engage in regular exercise appropriate for her age and condition.   Annual physical exam  Primary stress urinary incontinence  Hypothyroidism due to acquired atrophy of thyroid  -     TSH + free T4  Osteopenia, unspecified location  Hypercholesteremia Assessment & Plan: Chronic, stable. No acute concerns.  Continue rosuvastatin  5 mg daily. Continue to monitor. Will check CMP and lipid panel today.   Orders: -     Lipid Panel With LDL/HDL Ratio  Chronic kidney disease, stage 2, mildly decreased GFR Assessment & Plan: Chronic, stable. Continue to optimize risk factors.  Orders: -     Comprehensive metabolic panel with GFR  Elevated vitamin B12 level -     Vitamin B12     Annual physical exam Physical exam overall unremarkable except as  noted above. Routine lab work ordered as noted. Medications reviewed.  - Order blood work. - Schedule follow-up in six months or as needed.  Primary stress urinary Incontinence Likely stress incontinence due to weakened pelvic floor muscles. No prior Kegel exercises. - Discussed and provided information on Kegel exercises. - Provided video link for Kegel exercise instructions.  Hypothyroidism due to acquired atrophy of thyroid  Chronic, stable. Managed with Synthroid . No new symptoms. - Include TSH level in blood work.  Osteopenia, unspecified location Previous bone density test showed osteopenia. Not on calcium  supplements. - Recommend calcium  supplementation [phone] mg per day. - Encourage scheduling of bone density test.    Return in about 6 months (around 06/22/2024) for Chronic f/u.     I discussed the assessment and treatment plan with the patient  The patient was provided an opportunity to ask questions and all were answered. The patient  agreed with the plan and demonstrated an understanding of the instructions.   The patient was advised to call back or seek an in-person evaluation if the symptoms worsen or if the condition fails to improve as anticipated.    LAURAINE LOISE BUOY, DO  [facility] [phone] (phone) [phone] (fax)  [facility]

## 2023-12-23 NOTE — Assessment & Plan Note (Signed)
 Chronic, stable. Continue to optimize risk factors.

## 2023-12-23 NOTE — Patient Instructions (Signed)
 Please call the [facility] [phone]) to schedule a routine bone density test.

## 2023-12-23 NOTE — Assessment & Plan Note (Signed)
 Chronic, stable. No acute concerns.  Continue rosuvastatin  5 mg daily. Continue to monitor. Will check CMP and lipid panel today.

## 2023-12-27 ENCOUNTER — Encounter: Admitting: Physical Therapy

## 2023-12-27 ENCOUNTER — Telehealth: Payer: Self-pay

## 2023-12-27 NOTE — Telephone Encounter (Signed)
 Copied from CRM [id number]. Topic: Medical Record Request - Patient Chart Correction Request >> Dec 24, 2023  2:15 PM Charlet HERO wrote: Reason for CRM: Patient is calling she is stating that her after visit summary shows chronic kidney disease stage 2 and the patient does not have this disease. Please call the patient back [phone]

## 2023-12-29 ENCOUNTER — Encounter: Admitting: Physical Therapy

## 2023-12-31 NOTE — Telephone Encounter (Signed)
 FYI......SABRACalled patient and relayed the information that the provider had in the message, I explained to her that there was a plan and assessment regarding chronic kidney disease on her visit on 12/23/2023 she profusely stated that it was never discussed.  I asked her if she would like to speak with the provider regarding this and she stated that she was getting established with another provider.

## 2024-01-20 ENCOUNTER — Other Ambulatory Visit: Payer: Self-pay | Admitting: Family Medicine

## 2024-01-20 DIAGNOSIS — E7800 Pure hypercholesterolemia, unspecified: Secondary | ICD-10-CM

## 2024-01-31 DIAGNOSIS — M9905 Segmental and somatic dysfunction of pelvic region: Secondary | ICD-10-CM | POA: Diagnosis not present

## 2024-01-31 DIAGNOSIS — M5432 Sciatica, left side: Secondary | ICD-10-CM | POA: Diagnosis not present

## 2024-01-31 DIAGNOSIS — M9903 Segmental and somatic dysfunction of lumbar region: Secondary | ICD-10-CM | POA: Diagnosis not present

## 2024-01-31 DIAGNOSIS — M545 Low back pain, unspecified: Secondary | ICD-10-CM | POA: Diagnosis not present

## 2024-03-31 ENCOUNTER — Other Ambulatory Visit: Payer: Self-pay

## 2024-03-31 ENCOUNTER — Emergency Department

## 2024-03-31 ENCOUNTER — Emergency Department
Admission: EM | Admit: 2024-03-31 | Source: Home / Self Care | Attending: Emergency Medicine | Admitting: Emergency Medicine

## 2024-03-31 DIAGNOSIS — R002 Palpitations: Secondary | ICD-10-CM

## 2024-03-31 DIAGNOSIS — R000 Tachycardia, unspecified: Secondary | ICD-10-CM

## 2024-03-31 LAB — CBC WITH DIFFERENTIAL/PLATELET
Abs Immature Granulocytes: 0.02 10*3/uL (ref 0.00–0.07)
Basophils Absolute: 0 10*3/uL (ref 0.0–0.1)
Basophils Relative: 0 %
Eosinophils Absolute: 0.3 10*3/uL (ref 0.0–0.5)
Eosinophils Relative: 5 %
HCT: 43.4 % (ref 36.0–46.0)
Hemoglobin: 14.2 g/dL (ref 12.0–15.0)
Immature Granulocytes: 0 %
Lymphocytes Relative: 22 %
Lymphs Abs: 1.4 10*3/uL (ref 0.7–4.0)
MCH: 30.7 pg (ref 26.0–34.0)
MCHC: 32.7 g/dL (ref 30.0–36.0)
MCV: 93.9 fL (ref 80.0–100.0)
Monocytes Absolute: 0.6 10*3/uL (ref 0.1–1.0)
Monocytes Relative: 10 %
Neutro Abs: 4 10*3/uL (ref 1.7–7.7)
Neutrophils Relative %: 63 %
Platelets: 291 10*3/uL (ref 150–400)
RBC: 4.62 MIL/uL (ref 3.87–5.11)
RDW: 12.9 % (ref 11.5–15.5)
WBC: 6.4 10*3/uL (ref 4.0–10.5)
nRBC: 0 % (ref 0.0–0.2)

## 2024-03-31 LAB — BASIC METABOLIC PANEL WITH GFR
Anion gap: 10 (ref 5–15)
BUN: 17 mg/dL (ref 8–23)
CO2: 28 mmol/L (ref 22–32)
Calcium: 9.6 mg/dL (ref 8.9–10.3)
Chloride: 102 mmol/L (ref 98–111)
Creatinine, Ser: 0.73 mg/dL (ref 0.44–1.00)
GFR, Estimated: >60 mL/min
Glucose, Bld: 110 mg/dL — ABNORMAL HIGH (ref 70–99)
Potassium: 4 mmol/L (ref 3.5–5.1)
Sodium: 139 mmol/L (ref 135–145)

## 2024-03-31 LAB — TROPONIN T, HIGH SENSITIVITY: Troponin T High Sensitivity: 9 ng/L (ref 0–19)

## 2024-03-31 MED ORDER — HYDRALAZINE HCL 20 MG/ML IJ SOLN: 5.0000 mg | Freq: Once | INTRAMUSCULAR | Status: AC

## 2024-03-31 NOTE — ED Notes (Signed)
 Pt presenting to ED d/t blurred vision, garbled speech, and eventually headache. Pt noticed blurred vision around 1530 and garbled speech began around 1730. Pt symptoms cleared up by the time EMS arrived. Negative stroke screen. Pt endorsing mild headache. Pt neuro intact at this time. Pt ABCs intact. RR even and unlabored. Pt in NAD. Bed in lowest locked position. Call bell in reach. Denies needs at this time.    Past Medical History:  Diagnosis Date   Anemia    Anxiety    Arthritis    Blood transfusion without reported diagnosis    Cataract 2014   GERD (gastroesophageal reflux disease)    Headache    Hypertension    patient denies   Hypothyroidism    Macular degeneration    Ovarian cyst, left 2021   PONV (postoperative nausea and vomiting)

## 2024-03-31 NOTE — ED Provider Notes (Signed)
 "  [facility] Provider Note    Event Date/Time   First MD Initiated Contact with Patient 03/31/24 2123     (approximate)   History   Blurred Vision   HPI  Jamie [name] is a 84 y.o. female with a history of hypertension, hypothyroidism, GERD, esophageal stricture and hyperlipidemia who presents with acute onset of aphasia and possible altered mental status.  The patient states that around the early afternoon she started to have blurred vision, which she often gets before migraine, but she did not develop a headache.  Subsequently her daughter called her and noted that her speech seemed garbled.  When the daughter arrived, the patient had difficulty speaking and seemed confused.  This lasted for about 2 hours and resolved right around the time the EMS came to bring the patient to the ED.  The patient's symptoms have completely resolved.  She has a mild headache but otherwise feels normal.  The daughter states that her mental status and speech are at her baseline.  The patient denies any chest pain or difficulty breathing.  She denies feeling dizzy or lightheaded.  She has no vomiting or diarrhea.  I reviewed the past medical records.  The patient's most recent outpatient encounter was on 12/9 with primary care for establishment of care and follow-up of her chronic conditions.   Physical Exam   Triage Vital Signs: ED Triage Vitals  Encounter Vitals Group     BP 03/31/24 1914 (!) 201/74     Girls Systolic BP Percentile --      Girls Diastolic BP Percentile --      Boys Systolic BP Percentile --      Boys Diastolic BP Percentile --      Pulse Rate 03/31/24 1914 73     Resp 03/31/24 1914 18     Temp 03/31/24 1914 97.7 F (36.5 C)     Temp Source 03/31/24 1914 Oral     SpO2 03/31/24 1914 100 %     Weight 03/31/24 1915 130 lb (59 kg)     Height 03/31/24 1915 5' 2 (1.575 m)     Head Circumference --      Peak Flow --      Pain Score 03/31/24 1915 0     Pain  Loc --      Pain Education --      Exclude from Growth Chart --     Most recent vital signs: Vitals:   03/31/24 1914  BP: (!) 201/74  Pulse: 73  Resp: 18  Temp: 97.7 F (36.5 C)  SpO2: 100%     General: Alert and oriented, well-appearing for age, no distress.  CV:  Good peripheral perfusion.  Resp:  Normal effort.  Abd:  No distention.  Other:  EOMI.  PERRLA.  No photophobia.  No facial droop.  Normal speech.  Motor and sensory intact in all extremities.  No ataxia on finger-to-nose.   ED Results / Procedures / Treatments   Labs (all labs ordered are listed, but only abnormal results are displayed) Labs Reviewed  BASIC METABOLIC PANEL WITH GFR - Abnormal; Notable for the following components:      Result Value   Glucose, Bld 110 (*)    All other components within normal limits  CBC WITH DIFFERENTIAL/PLATELET  URINALYSIS, ROUTINE W REFLEX MICROSCOPIC  TROPONIN T, HIGH SENSITIVITY     EKG  ED ECG REPORT I, Waylon Cassis, the attending physician, personally viewed and interpreted this  ECG.  Date: 03/31/2024 EKG Time: 2233 Rate: 70 Rhythm: normal sinus rhythm QRS Axis: normal Intervals: normal ST/T Wave abnormalities: normal Narrative Interpretation: no evidence of acute ischemia    RADIOLOGY  MR brain: Pending  PROCEDURES:  Critical Care performed: No  Procedures   MEDICATIONS ORDERED IN ED: Medications  hydrALAZINE  (APRESOLINE ) injection 5 mg (has no administration in time range)     IMPRESSION / MDM / ASSESSMENT AND PLAN / ED COURSE  I reviewed the triage vital signs and the nursing notes.  84 year old female with PMH as noted above presents with an episode this afternoon that lasted about 2 hours that started with blurred vision and then involved aphasia and possible altered mental status.  The symptoms have resolved and she is now back to baseline.  Differential diagnosis includes, but is not limited to, TIA, near syncope,  dehydration, electrolyte abnormality, UTI or other infection.  Since it is now been several hours since the episode, there is no indication for an emergent CT.  We will proceed directly to an MRI as this will be more sensitive and specific.  I have also ordered a lab workup and a urinalysis.  I would consider admission even if the initial workup is negative.  Patient's presentation is most consistent with acute presentation with potential threat to life or bodily function.  The patient is on the cardiac monitor to evaluate for evidence of arrhythmia and/or significant heart rate changes.  ----------------------------------------- 12:00 AM on 04/01/2024 -----------------------------------------  BMP and CBC showed no acute findings.  MRI and urinalysis are pending.  I have signed the patient out to the oncoming ED physician Dr. Gordan.  The patient expressed a strong preference to go home if the initial workup is negative.  FINAL CLINICAL IMPRESSION(S) / ED DIAGNOSES   Final diagnoses:  Palpitations  Tachycardia     Rx / DC Orders   ED Discharge Orders     None        Note:  This document was prepared using Dragon voice recognition software and may include unintentional dictation errors.    Jacolyn Pae, MD 04/01/24 0001  "

## 2024-03-31 NOTE — ED Triage Notes (Signed)
 Patient brought in by EMS from home for blurred vision this afternoon lasting ~2 hours, no blurred vision at this time. Daughter states patient had some garbled speech lasting ~1.5 hours, no garbled speech at this time. Patient states she had memory issues during episode, stating that she couldn't 911. No history of same, all symptoms resolved at this time. AxOx4, neuro assessment WNL, hypertensive in triage.

## 2024-03-31 NOTE — ED Provider Notes (Incomplete)
----------------------------------------- °  11:42 PM on 03/31/2024 -----------------------------------------  Blood pressure (!) 201/74, pulse 73, temperature 97.7 F (36.5 C), temperature source Oral, resp. rate 18, height 1.575 m (5' 2), weight 59 kg, SpO2 100%.   Assuming care from Dr. Jacolyn.  In short, Jamie [name] is a 84 y.o. female with a chief complaint of TIA-like symptoms.  Refer to the original H&P for additional details.  The current plan of care is to follow up on MRI and labs.  Patient wants to go home if at possible.        Medications - No data to display   ED Discharge Orders     None      Final diagnoses:  Palpitations  Tachycardia

## 2024-03-31 NOTE — ED Triage Notes (Signed)
 First nurse note: pt to ED [facility] from home for blurry vision started at 1330 but this happens before gets migraine but never got a migraine today. Neg stroke screen with EMS. Denies blurred vision currently. HTN with EMS, does not take meds for same.

## 2024-06-22 ENCOUNTER — Ambulatory Visit: Admitting: Family Medicine

## 2024-09-26 ENCOUNTER — Ambulatory Visit
